# Patient Record
Sex: Male | Born: 1965 | Race: Black or African American | Hispanic: No | Marital: Single | Smoking: Former smoker
Health system: Southern US, Community
[De-identification: ages and names within clinical notes are randomized; demographics above are authoritative.]

## PROBLEM LIST (undated history)

## (undated) DIAGNOSIS — R519 Headache, unspecified: Secondary | ICD-10-CM

## (undated) DIAGNOSIS — S069XAA Unspecified intracranial injury with loss of consciousness status unknown, initial encounter: Secondary | ICD-10-CM

## (undated) DIAGNOSIS — G8929 Other chronic pain: Secondary | ICD-10-CM

## (undated) DIAGNOSIS — F1020 Alcohol dependence, uncomplicated: Secondary | ICD-10-CM

## (undated) DIAGNOSIS — Z5189 Encounter for other specified aftercare: Secondary | ICD-10-CM

## (undated) DIAGNOSIS — L309 Dermatitis, unspecified: Secondary | ICD-10-CM

## (undated) DIAGNOSIS — S069X9A Unspecified intracranial injury with loss of consciousness of unspecified duration, initial encounter: Secondary | ICD-10-CM

## (undated) DIAGNOSIS — F32A Depression, unspecified: Secondary | ICD-10-CM

## (undated) DIAGNOSIS — R51 Headache: Secondary | ICD-10-CM

## (undated) DIAGNOSIS — F329 Major depressive disorder, single episode, unspecified: Secondary | ICD-10-CM

## (undated) HISTORY — DX: Major depressive disorder, single episode, unspecified: F32.9

## (undated) HISTORY — DX: Unspecified intracranial injury with loss of consciousness of unspecified duration, initial encounter: S06.9X9A

## (undated) HISTORY — DX: Headache: R51

## (undated) HISTORY — PX: EYE SURGERY: SHX253

## (undated) HISTORY — DX: Encounter for other specified aftercare: Z51.89

## (undated) HISTORY — DX: Other chronic pain: G89.29

## (undated) HISTORY — DX: Headache, unspecified: R51.9

## (undated) HISTORY — DX: Dermatitis, unspecified: L30.9

## (undated) HISTORY — DX: Unspecified intracranial injury with loss of consciousness status unknown, initial encounter: S06.9XAA

## (undated) HISTORY — DX: Alcohol dependence, uncomplicated: F10.20

## (undated) HISTORY — PX: JEJUNOSTOMY FEEDING TUBE: SUR737

## (undated) HISTORY — PX: IVC FILTER INSERTION: CATH118245

## (undated) HISTORY — DX: Depression, unspecified: F32.A

---

## 2004-07-10 ENCOUNTER — Emergency Department (HOSPITAL_COMMUNITY): Admission: EM | Admit: 2004-07-10 | Discharge: 2004-07-10 | Payer: Self-pay | Admitting: Emergency Medicine

## 2011-09-09 HISTORY — PX: BRAIN SURGERY: SHX531

## 2011-09-10 ENCOUNTER — Emergency Department (HOSPITAL_COMMUNITY): Payer: BC Managed Care – PPO

## 2011-09-10 ENCOUNTER — Inpatient Hospital Stay (HOSPITAL_COMMUNITY)
Admission: EM | Admit: 2011-09-10 | Discharge: 2011-10-08 | DRG: 877 | Disposition: A | Discharge status: Rehabilitation Facility | Payer: BC Managed Care – PPO | Source: Ambulatory Visit | Attending: General Surgery | Admitting: General Surgery

## 2011-09-10 ENCOUNTER — Encounter (HOSPITAL_COMMUNITY): Payer: Self-pay | Admitting: Neurological Surgery

## 2011-09-10 DIAGNOSIS — E781 Pure hyperglyceridemia: Secondary | ICD-10-CM | POA: Diagnosis present

## 2011-09-10 DIAGNOSIS — E46 Unspecified protein-calorie malnutrition: Secondary | ICD-10-CM | POA: Diagnosis present

## 2011-09-10 DIAGNOSIS — S0193XA Puncture wound without foreign body of unspecified part of head, initial encounter: Secondary | ICD-10-CM

## 2011-09-10 DIAGNOSIS — S062XAA Diffuse traumatic brain injury with loss of consciousness status unknown, initial encounter: Principal | ICD-10-CM | POA: Diagnosis present

## 2011-09-10 DIAGNOSIS — D62 Acute posthemorrhagic anemia: Secondary | ICD-10-CM | POA: Diagnosis not present

## 2011-09-10 DIAGNOSIS — S069X9A Unspecified intracranial injury with loss of consciousness of unspecified duration, initial encounter: Secondary | ICD-10-CM | POA: Diagnosis present

## 2011-09-10 DIAGNOSIS — J95821 Acute postprocedural respiratory failure: Secondary | ICD-10-CM | POA: Diagnosis present

## 2011-09-10 DIAGNOSIS — X749XXA Intentional self-harm by unspecified firearm discharge, initial encounter: Secondary | ICD-10-CM | POA: Diagnosis present

## 2011-09-10 DIAGNOSIS — F17200 Nicotine dependence, unspecified, uncomplicated: Secondary | ICD-10-CM | POA: Diagnosis present

## 2011-09-10 DIAGNOSIS — S0190XA Unspecified open wound of unspecified part of head, initial encounter: Principal | ICD-10-CM | POA: Diagnosis present

## 2011-09-10 DIAGNOSIS — Z23 Encounter for immunization: Secondary | ICD-10-CM

## 2011-09-10 DIAGNOSIS — J15211 Pneumonia due to Methicillin susceptible Staphylococcus aureus: Secondary | ICD-10-CM | POA: Diagnosis not present

## 2011-09-10 DIAGNOSIS — J9819 Other pulmonary collapse: Secondary | ICD-10-CM | POA: Diagnosis present

## 2011-09-10 DIAGNOSIS — J9600 Acute respiratory failure, unspecified whether with hypoxia or hypercapnia: Secondary | ICD-10-CM | POA: Diagnosis present

## 2011-09-10 DIAGNOSIS — S069XAA Unspecified intracranial injury with loss of consciousness status unknown, initial encounter: Secondary | ICD-10-CM | POA: Diagnosis present

## 2011-09-10 DIAGNOSIS — Z6830 Body mass index (BMI) 30.0-30.9, adult: Secondary | ICD-10-CM

## 2011-09-10 LAB — TYPE AND SCREEN
Antibody Screen: NEGATIVE
Unit division: 0

## 2011-09-10 LAB — POCT I-STAT 3, ART BLOOD GAS (G3+)
Acid-base deficit: 6 mmol/L — ABNORMAL HIGH (ref 0.0–2.0)
O2 Saturation: 100 %

## 2011-09-10 LAB — COMPREHENSIVE METABOLIC PANEL
Albumin: 3.2 g/dL — ABNORMAL LOW (ref 3.5–5.2)
Alkaline Phosphatase: 63 U/L (ref 39–117)
BUN: 16 mg/dL (ref 6–23)
Calcium: 8.1 mg/dL — ABNORMAL LOW (ref 8.4–10.5)
Creatinine, Ser: 0.87 mg/dL (ref 0.50–1.35)
Potassium: 3.8 mEq/L (ref 3.5–5.1)
Total Protein: 6.1 g/dL (ref 6.0–8.3)

## 2011-09-10 LAB — BLOOD GAS, ARTERIAL
Acid-base deficit: 4.6 mmol/L — ABNORMAL HIGH (ref 0.0–2.0)
Bicarbonate: 20.1 mEq/L (ref 20.0–24.0)
FIO2: 0.5 %
TCO2: 21.2 mmol/L (ref 0–100)
pCO2 arterial: 37.5 mmHg (ref 35.0–45.0)
pH, Arterial: 7.348 — ABNORMAL LOW (ref 7.350–7.450)
pO2, Arterial: 139 mmHg — ABNORMAL HIGH (ref 80.0–100.0)

## 2011-09-10 LAB — URINALYSIS, ROUTINE W REFLEX MICROSCOPIC
Glucose, UA: 100 mg/dL — AB
Protein, ur: 100 mg/dL — AB

## 2011-09-10 LAB — CBC
HCT: 35.4 % — ABNORMAL LOW (ref 39.0–52.0)
MCH: 28.8 pg (ref 26.0–34.0)
MCHC: 33.3 g/dL (ref 30.0–36.0)
MCV: 86.3 fL (ref 78.0–100.0)
Platelets: 202 10*3/uL (ref 150–400)
RDW: 13.7 % (ref 11.5–15.5)

## 2011-09-10 LAB — RAPID URINE DRUG SCREEN, HOSP PERFORMED
Amphetamines: NOT DETECTED
Benzodiazepines: POSITIVE — AB
Cocaine: NOT DETECTED
Opiates: NOT DETECTED

## 2011-09-10 LAB — DIFFERENTIAL
Eosinophils Absolute: 0 10*3/uL (ref 0.0–0.7)
Eosinophils Relative: 0 % (ref 0–5)
Lymphs Abs: 1.3 10*3/uL (ref 0.7–4.0)
Monocytes Relative: 4 % (ref 3–12)

## 2011-09-10 LAB — POCT I-STAT, CHEM 8
BUN: 18 mg/dL (ref 6–23)
Calcium, Ion: 1.2 mmol/L (ref 1.12–1.32)
Creatinine, Ser: 0.9 mg/dL (ref 0.50–1.35)
TCO2: 21 mmol/L (ref 0–100)

## 2011-09-10 LAB — URINE MICROSCOPIC-ADD ON

## 2011-09-10 LAB — ETHANOL: Alcohol, Ethyl (B): <11 mg/dL (ref 0–11)

## 2011-09-10 MED ORDER — MIDAZOLAM BOLUS VIA INFUSION
1.0000 mg | INTRAVENOUS | Status: DC | PRN
  Filled 2011-09-10: qty 2

## 2011-09-10 MED ORDER — SODIUM CHLORIDE 0.9 % IV SOLN
2.0000 mg/h | INTRAVENOUS | Status: DC
  Administered 2011-09-10 – 2011-09-11 (×3): 2 mg/h via INTRAVENOUS
  Administered 2011-09-11: 4 mg/h via INTRAVENOUS
  Administered 2011-09-13 – 2011-09-14 (×2): 2 mg/h via INTRAVENOUS
  Administered 2011-09-14: 3 mg/h via INTRAVENOUS
  Administered 2011-09-15: 1 mg/h via INTRAVENOUS
  Filled 2011-09-10 (×8): qty 10

## 2011-09-10 MED ORDER — SUCCINYLCHOLINE CHLORIDE 20 MG/ML IJ SOLN
INTRAMUSCULAR | Status: DC | PRN
  Administered 2011-09-10: 75 mg via INTRAVENOUS
  Administered 2011-09-10: 125 mg via INTRAVENOUS

## 2011-09-10 MED ORDER — ROCURONIUM BROMIDE 50 MG/5ML IV SOLN
0.6000 mg/kg | Freq: Once | INTRAVENOUS | Status: AC
  Administered 2011-09-10: 100 mg via INTRAVENOUS

## 2011-09-10 MED ORDER — LIDOCAINE HCL (CARDIAC) 20 MG/ML IV SOLN
INTRAVENOUS | Status: DC | PRN
  Administered 2011-09-10: 100 mg via INTRAVENOUS

## 2011-09-10 MED ORDER — SODIUM CHLORIDE 0.9 % IJ SOLN
10.0000 mL | INTRAMUSCULAR | Status: DC | PRN
  Administered 2011-09-19 (×2): 10 mL

## 2011-09-10 MED ORDER — MANNITOL 25 % IV SOLN
25.0000 g/h | Freq: Four times a day (QID) | INTRAVENOUS | Status: DC
  Filled 2011-09-10 (×4): qty 100

## 2011-09-10 MED ORDER — SODIUM CHLORIDE 0.9 % IJ SOLN
10.0000 mL | Freq: Two times a day (BID) | INTRAMUSCULAR | Status: DC
  Administered 2011-09-10 – 2011-09-13 (×7): 10 mL
  Administered 2011-09-14: 20 mL
  Administered 2011-09-14 – 2011-09-15 (×3): 10 mL
  Administered 2011-09-16: 40 mL
  Administered 2011-09-17: 20 mL
  Administered 2011-09-17: 40 mL
  Administered 2011-09-18: 10 mL
  Administered 2011-09-18: 40 mL
  Administered 2011-09-19 – 2011-09-20 (×4): 10 mL
  Administered 2011-09-21 (×2): 20 mL
  Administered 2011-09-22 – 2011-10-04 (×14): 10 mL
  Administered 2011-10-05 – 2011-10-07 (×4): 12 mL
  Administered 2011-10-08: 10 mL

## 2011-09-10 MED ORDER — ETOMIDATE 2 MG/ML IV SOLN
INTRAVENOUS | Status: DC | PRN
  Administered 2011-09-10: 20 mg via INTRAVENOUS

## 2011-09-10 MED ORDER — SODIUM CHLORIDE 0.9 % IV SOLN
50.0000 ug/h | INTRAVENOUS | Status: DC
  Administered 2011-09-10 (×2): 50 ug/h via INTRAVENOUS
  Administered 2011-09-12 – 2011-09-13 (×2): 75 ug/h via INTRAVENOUS
  Administered 2011-09-15: 70 ug/h via INTRAVENOUS
  Administered 2011-09-16: 100 ug/h via INTRAVENOUS
  Administered 2011-09-17: 75 ug/h via INTRAVENOUS
  Administered 2011-09-18 – 2011-09-21 (×2): 50 ug/h via INTRAVENOUS
  Administered 2011-09-23: 100 ug/h via INTRAVENOUS
  Filled 2011-09-10 (×11): qty 50

## 2011-09-10 MED ORDER — CHLORHEXIDINE GLUCONATE 0.12 % MT SOLN
OROMUCOSAL | Status: AC
  Administered 2011-09-10: 15 mL via OROMUCOSAL
  Filled 2011-09-10: qty 30

## 2011-09-10 MED ORDER — MIDAZOLAM HCL 2 MG/2ML IJ SOLN
INTRAMUSCULAR | Status: AC
  Administered 2011-09-10: 4 mg
  Filled 2011-09-10: qty 4

## 2011-09-10 MED ORDER — FENTANYL BOLUS VIA INFUSION
50.0000 ug | Freq: Four times a day (QID) | INTRAVENOUS | Status: DC | PRN
  Administered 2011-09-19 (×2): 75 ug via INTRAVENOUS
  Filled 2011-09-10: qty 100

## 2011-09-10 MED ORDER — CHLORHEXIDINE GLUCONATE 0.12 % MT SOLN
15.0000 mL | Freq: Two times a day (BID) | OROMUCOSAL | Status: DC
  Administered 2011-09-10 – 2011-10-08 (×55): 15 mL via OROMUCOSAL
  Filled 2011-09-10 (×58): qty 15

## 2011-09-10 MED ORDER — POTASSIUM CHLORIDE IN NACL 20-0.9 MEQ/L-% IV SOLN
INTRAVENOUS | Status: DC
  Administered 2011-09-10 – 2011-09-11 (×3): via INTRAVENOUS
  Administered 2011-09-12: 100 mL/h via INTRAVENOUS
  Administered 2011-09-12 – 2011-09-16 (×4): via INTRAVENOUS
  Administered 2011-09-18: 10 mL/h via INTRAVENOUS
  Administered 2011-09-20: 20 mL/h via INTRAVENOUS
  Administered 2011-09-22 – 2011-09-27 (×3): via INTRAVENOUS
  Filled 2011-09-10 (×22): qty 1000

## 2011-09-10 MED ORDER — FENTANYL CITRATE 0.05 MG/ML IJ SOLN
INTRAMUSCULAR | Status: AC
  Administered 2011-09-10: 50 ug
  Filled 2011-09-10: qty 2

## 2011-09-10 MED ORDER — MANNITOL 25 % IV SOLN
12.5000 g | Freq: Four times a day (QID) | INTRAVENOUS | Status: DC
  Administered 2011-09-10 – 2011-09-16 (×23): 12.5 g via INTRAVENOUS
  Filled 2011-09-10 (×48): qty 50

## 2011-09-10 MED ORDER — PHENYLEPHRINE HCL 10 MG/ML IJ SOLN
30.0000 ug/min | INTRAMUSCULAR | Status: AC
  Filled 2011-09-10: qty 1

## 2011-09-10 MED ORDER — BIOTENE DRY MOUTH MT LIQD
15.0000 mL | Freq: Two times a day (BID) | OROMUCOSAL | Status: DC
  Administered 2011-09-10 – 2011-09-16 (×13): 15 mL via OROMUCOSAL

## 2011-09-10 MED ORDER — PANTOPRAZOLE SODIUM 40 MG IV SOLR
40.0000 mg | Freq: Every day | INTRAVENOUS | Status: DC
  Administered 2011-09-10 – 2011-09-11 (×2): 40 mg via INTRAVENOUS
  Filled 2011-09-10 (×3): qty 40

## 2011-09-10 MED ORDER — FENTANYL CITRATE 0.05 MG/ML IJ SOLN
INTRAMUSCULAR | Status: AC
  Administered 2011-09-10: 100 ug
  Filled 2011-09-10: qty 2
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
## 2011-09-10 NOTE — ED Notes (Addendum)
 fentanyl given IV (rt AC)

## 2011-09-10 NOTE — ED Notes (Signed)
Family updated as to patient's status.

## 2011-09-10 NOTE — ED Notes (Signed)
Dr Preston Fleeting and RT at bedside attempting to intubate ot

## 2011-09-10 NOTE — ED Notes (Addendum)
75mg  Succinylcholine given via IV to rt [facility]

## 2011-09-10 NOTE — ED Notes (Signed)
Pt log rolled off backboard
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
## 2011-09-10 NOTE — ED Notes (Signed)
Family at beside. Family given emotional support. 

## 2011-09-10 NOTE — ED Notes (Signed)
Aspen collar placed.
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
## 2011-09-10 NOTE — ED Notes (Signed)
Returned from CT scan.
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
## 2011-09-10 NOTE — ED Notes (Signed)
[employer]SI at bedside. 

## 2011-09-10 NOTE — ED Notes (Signed)
IV fluids 1 liter NS hung to rt AC infusing via gravity. 100mg  Lidicane given IV rt AC

## 2011-09-10 NOTE — ED Notes (Signed)
Pt transported to CT via stretcher by RT and RN

## 2011-09-10 NOTE — Progress Notes (Signed)
Family set password as "[email or web address]"  Frederico Hamman, [place] [phone]

## 2011-09-10 NOTE — ED Notes (Addendum)
  fentanyl given via IV Rt AC 4mg  versed given via IV rt AC

## 2011-09-10 NOTE — ED Notes (Signed)
Intubated with 7.5 inserted by Dr Preston Fleeting, with positive color change

## 2011-09-10 NOTE — ED Notes (Signed)
IV fluids NS hung to lt AC infusing via gravity
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
## 2011-09-10 NOTE — ED Notes (Addendum)
Amidate 20mg  IV given rt AC Succinylcholine 125mg  IV given  Rt AC
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
## 2011-09-10 NOTE — ED Notes (Signed)
portable xray at bedside

## 2011-09-10 NOTE — ED Notes (Signed)
RT and DR Preston Fleeting cont'ing to attempt to intubate pt

## 2011-09-10 NOTE — ED Notes (Addendum)
Oral airway inserted by MD. Pt being Bagged

## 2011-09-10 NOTE — ED Notes (Signed)
intubated tube removed by Dr Preston Fleeting, tube not in place
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
## 2011-09-11 ENCOUNTER — Inpatient Hospital Stay (HOSPITAL_COMMUNITY): Payer: BC Managed Care – PPO

## 2011-09-11 DIAGNOSIS — S069X9A Unspecified intracranial injury with loss of consciousness of unspecified duration, initial encounter: Secondary | ICD-10-CM

## 2011-09-11 DIAGNOSIS — D62 Acute posthemorrhagic anemia: Secondary | ICD-10-CM | POA: Diagnosis not present

## 2011-09-11 DIAGNOSIS — S0190XA Unspecified open wound of unspecified part of head, initial encounter: Secondary | ICD-10-CM

## 2011-09-11 DIAGNOSIS — J95821 Acute postprocedural respiratory failure: Secondary | ICD-10-CM

## 2011-09-11 LAB — CBC
HCT: 34.6 % — ABNORMAL LOW (ref 39.0–52.0)
MCV: 86.7 fL (ref 78.0–100.0)
Platelets: 177 10*3/uL (ref 150–400)
RBC: 3.99 MIL/uL — ABNORMAL LOW (ref 4.22–5.81)
RDW: 14 % (ref 11.5–15.5)
WBC: 12.5 10*3/uL — ABNORMAL HIGH (ref 4.0–10.5)

## 2011-09-11 LAB — BASIC METABOLIC PANEL
BUN: 10 mg/dL (ref 6–23)
CO2: 23 mEq/L (ref 19–32)
Chloride: 109 mEq/L (ref 96–112)
Creatinine, Ser: 0.68 mg/dL (ref 0.50–1.35)
GFR calc Af Amer: >90 mL/min (ref >90)
Potassium: 4.1 mEq/L (ref 3.5–5.1)

## 2011-09-11 LAB — BLOOD GAS, ARTERIAL
Acid-base deficit: 2.5 mmol/L — ABNORMAL HIGH (ref 0.0–2.0)
Drawn by: 29017
FIO2: 0.5 %
MECHVT: 550 mL
Patient temperature: 98.6
RATE: 18 resp/min
TCO2: 22.3 mmol/L (ref 0–100)
pCO2 arterial: 33.2 mmHg — ABNORMAL LOW (ref 35.0–45.0)

## 2011-09-11 LAB — URINE CULTURE
Colony Count: NO GROWTH
Culture  Setup Time: 201303141408
Culture: NO GROWTH

## 2011-09-11 MED ORDER — CEFTRIAXONE SODIUM 1 G IJ SOLR: 1.0000 g | INTRAMUSCULAR | Status: DC

## 2011-09-11 MED ORDER — VITAMIN E 15 UNIT/0.3ML PO SOLN
400.0000 [IU] | Freq: Three times a day (TID) | ORAL | Status: AC
  Administered 2011-09-11: 50 [IU]
  Administered 2011-09-11: 100 [IU]
  Administered 2011-09-11: 50 [IU]
  Administered 2011-09-12 (×2): 400 [IU]
  Administered 2011-09-12: 50 [IU]
  Administered 2011-09-13: 400 [IU]
  Administered 2011-09-13: 50 [IU]
  Administered 2011-09-13 – 2011-09-17 (×11): 400 [IU]
  Filled 2011-09-11 (×28): qty 8

## 2011-09-11 MED ORDER — PIVOT 1.5 CAL PO LIQD
1000.0000 mL | ORAL | Status: DC
  Administered 2011-09-11 – 2011-09-14 (×3): 1000 mL
  Filled 2011-09-11 (×6): qty 1000

## 2011-09-11 MED ORDER — DEXTROSE 5 % IV SOLN
1.0000 g | INTRAVENOUS | Status: DC
  Administered 2011-09-11 – 2011-09-16 (×6): 1 g via INTRAVENOUS
  Filled 2011-09-11 (×7): qty 10

## 2011-09-11 MED ORDER — PRO-STAT SUGAR FREE PO LIQD
30.0000 mL | Freq: Four times a day (QID) | ORAL | Status: DC
  Administered 2011-09-11 – 2011-09-15 (×17): 30 mL
  Filled 2011-09-11 (×20): qty 30

## 2011-09-11 MED ORDER — ACETAMINOPHEN 160 MG/5ML PO SOLN
650.0000 mg | ORAL | Status: DC | PRN
  Administered 2011-09-11 – 2011-10-06 (×31): 650 mg via ORAL
  Filled 2011-09-11 (×31): qty 20.3

## 2011-09-11 MED ORDER — PIVOT 1.5 CAL PO LIQD
1000.0000 mL | ORAL | Status: DC
  Filled 2011-09-11 (×2): qty 1000

## 2011-09-11 MED ORDER — SELENIUM 50 MCG PO TABS
200.0000 ug | ORAL_TABLET | Freq: Every day | ORAL | Status: AC
Start: 2011-09-11 — End: 2011-09-17
  Administered 2011-09-11 – 2011-09-17 (×7): 200 ug
  Filled 2011-09-11 (×8): qty 4

## 2011-09-11 MED ORDER — METOPROLOL TARTRATE 25 MG/10 ML ORAL SUSPENSION
25.0000 mg | Freq: Two times a day (BID) | ORAL | Status: DC
  Administered 2011-09-11 – 2011-09-22 (×24): 25 mg via ORAL
  Filled 2011-09-11 (×26): qty 10

## 2011-09-11 MED ORDER — VITAMIN C 500 MG PO TABS
1000.0000 mg | ORAL_TABLET | Freq: Three times a day (TID) | ORAL | Status: AC
  Administered 2011-09-11 – 2011-09-17 (×21): 1000 mg
  Filled 2011-09-11 (×22): qty 2
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
## 2011-09-11 NOTE — Progress Notes (Signed)
F/C with RUE Opens eyes to voice Patient examined and I agree with the assessment and plan  Violeta Gelinas, MD, MPH, FACS Pager: [phone]  09/11/2011 9:50 AM

## 2011-09-11 NOTE — Progress Notes (Signed)
Utilization review completed. Dalary Hollar Diane3/15/2013
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
## 2011-09-12 ENCOUNTER — Inpatient Hospital Stay (HOSPITAL_COMMUNITY): Payer: BC Managed Care – PPO

## 2011-09-12 LAB — BASIC METABOLIC PANEL
Calcium: 8.7 mg/dL (ref 8.4–10.5)
GFR calc non Af Amer: >90 mL/min (ref >90)
Sodium: 140 mEq/L (ref 135–145)

## 2011-09-12 LAB — CBC
HCT: 31.1 % — ABNORMAL LOW (ref 39.0–52.0)
MCH: 27.9 pg (ref 26.0–34.0)
MCHC: 32.2 g/dL (ref 30.0–36.0)
MCV: 86.9 fL (ref 78.0–100.0)
Platelets: 165 10*3/uL (ref 150–400)
RDW: 13.9 % (ref 11.5–15.5)

## 2011-09-12 MED ORDER — PANTOPRAZOLE SODIUM 40 MG PO PACK
40.0000 mg | PACK | Freq: Every day | ORAL | Status: DC
  Administered 2011-09-12 – 2011-10-08 (×26): 40 mg
  Filled 2011-09-12 (×28): qty 20

## 2011-09-12 NOTE — Progress Notes (Signed)
I have seen and examined the patient and agree with the assessment and plans.  Ameshia Pewitt A. Toney Difatta  MD, FACS  
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
## 2011-09-13 ENCOUNTER — Inpatient Hospital Stay (HOSPITAL_COMMUNITY): Payer: BC Managed Care – PPO

## 2011-09-13 LAB — CBC
Platelets: 148 10*3/uL — ABNORMAL LOW (ref 150–400)
RBC: 3.28 MIL/uL — ABNORMAL LOW (ref 4.22–5.81)
WBC: 12.5 10*3/uL — ABNORMAL HIGH (ref 4.0–10.5)

## 2011-09-13 LAB — CLOSTRIDIUM DIFFICILE BY PCR: Toxigenic C. Difficile by PCR: NEGATIVE

## 2011-09-13 MED ORDER — ARTIFICIAL TEARS OP OINT
TOPICAL_OINTMENT | Freq: Three times a day (TID) | OPHTHALMIC | Status: DC
  Administered 2011-09-13 – 2011-09-15 (×8): via OPHTHALMIC
  Administered 2011-09-16: 1 via OPHTHALMIC
  Administered 2011-09-16 – 2011-09-19 (×9): via OPHTHALMIC
  Administered 2011-09-19: 1 via OPHTHALMIC
  Administered 2011-09-19 – 2011-09-21 (×5): via OPHTHALMIC
  Administered 2011-09-21: 1 via OPHTHALMIC
  Administered 2011-09-21 – 2011-09-25 (×13): via OPHTHALMIC
  Administered 2011-09-26: 1 via OPHTHALMIC
  Administered 2011-09-26: 14:00:00 via OPHTHALMIC
  Administered 2011-09-26: 1 via OPHTHALMIC
  Administered 2011-09-27 – 2011-10-08 (×35): via OPHTHALMIC
  Filled 2011-09-13 (×4): qty 3.5

## 2011-09-13 MED ORDER — ALTEPLASE 100 MG IV SOLR
2.0000 mg | Freq: Once | INTRAVENOUS | Status: AC
  Administered 2011-09-13: 2 mg
  Filled 2011-09-13: qty 2

## 2011-09-13 NOTE — Progress Notes (Signed)
Subjective:  Patient continues on ventilator via endotracheal tube. Patient was seen after sedation with Versed and fentanyl and held for about 1 hour 45 minutes.  Objective: Vital signs in last 24 hours: Filed Vitals:   09/13/11 0800 09/13/11 0807 09/13/11 0844 09/13/11 0900  BP: 153/79  153/79 144/74  Pulse: 84  67 74  Temp: 101.7 F (38.7 C)  101.5 F (38.6 C) 101.1 F (38.4 C)  TempSrc:   Core (Comment)   Resp: 23  19 21   Height:      Weight:      SpO2: 100% 100% 100% 100%    Intake/Output from previous day: 03/16 0701 - 03/17 0700 In: 3871 [I.V.:2536; ZO/XW:9604; IV Piggyback:50] Out: 2435 [Urine:2435] Intake/Output this shift: Total I/O In: 170 [I.V.:100; NG/GT:70] Out: 235 [Urine:235]  Physical Exam:  Dressing had been removed by trauma surgery staff. There was a small amount of devitalized brain tissue that could be expressed from the subgaleal space through the entrance wound, but no evidence of purulence or bleeding at this time. Patient opens eyes To voice. Pupils 2.5 mm bilaterally round and sluggishly reactive to light. Flaccid on left, squeezes and releases on right to command.  CBC  Basename 09/13/11 0500 09/12/11 0400  WBC 12.5* 9.9  HGB 9.3* 10.0*  HCT 28.5* 31.1*  PLT 148* 165   BMET  Basename 09/12/11 0400 09/11/11 0525  NA 140 140  K 4.4 4.1  CL 108 109  CO2 26 23  GLUCOSE 142* 135*  BUN 14 10  CREATININE 0.75 0.68  CALCIUM 8.7 8.7     Assessment/Plan: Stable from neurosurgical perspective. For followup CT of brain without contrast in a.m.   Hewitt Shorts, MD 09/13/2011, 9:40 AM

## 2011-09-13 NOTE — Progress Notes (Signed)
More sedated now and did not F/C for me.  Weaned well but now no weaning per NS. Patient examined and I agree with the assessment and plan  Violeta Gelinas, MD, MPH, FACS Pager: [phone]  09/13/2011 12:40 PM
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
## 2011-09-14 ENCOUNTER — Inpatient Hospital Stay (HOSPITAL_COMMUNITY): Payer: BC Managed Care – PPO

## 2011-09-14 LAB — BASIC METABOLIC PANEL
BUN: 15 mg/dL (ref 6–23)
Chloride: 108 mEq/L (ref 96–112)
Glucose, Bld: 143 mg/dL — ABNORMAL HIGH (ref 70–99)
Potassium: 4 mEq/L (ref 3.5–5.1)
Sodium: 142 mEq/L (ref 135–145)

## 2011-09-14 LAB — URINE CULTURE
Colony Count: NO GROWTH
Culture: NO GROWTH

## 2011-09-14 LAB — CBC
MCH: 28.4 pg (ref 26.0–34.0)
Platelets: 180 10*3/uL (ref 150–400)
RBC: 3.28 MIL/uL — ABNORMAL LOW (ref 4.22–5.81)
RDW: 13.7 % (ref 11.5–15.5)

## 2011-09-14 MED ORDER — WHITE PETROLATUM GEL
Status: AC
  Filled 2011-09-14: qty 5

## 2011-09-14 NOTE — Progress Notes (Signed)
The patient is not very responsive this morning.  Will not open eyes spontaneously.  Pupils are 2-3 and sluggishly reactive.  Still on some sedation.  This patient has been seen and I agree with the findings and treatment plan.  Marta Lamas. Gae Bon, MD, FACS [phone] (pager) [phone] (direct pager) Trauma Surgeon
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
## 2011-09-14 NOTE — Progress Notes (Signed)
Chaplain Note:  Chaplain responded immediately to referral from nurse to provide care for family of pt.  Chaplain introduced Mark [name] to pt's family and was invited into room.  Pt was asleep and did not communicate or show any sign of awareness that visitors were present.  Pt's family was at bedside.   Chaplain provided spiritual comfort, support, and prayer for pt and family.  Family was dealing with sadness and disappointment that, prior to attempting suicide, pt had not been comfortable sharing feelings and problems with anyone.  They lean on strong faith for spiritual strength.  Family expressed appreciation for chaplain support.  Chaplain will continue to follow this case.  09/14/11 1400  Clinical Encounter Type  Visited With Patient and family together  Visit Type Initial;Spiritual support  Referral From Nurse  Spiritual Encounters  Spiritual Needs Emotional;Prayer  Stress Factors  Patient Stress Factors Health changes  Family Stress Factors Lack of knowledge   Verdie Shire, chaplain resident [phone]

## 2011-09-14 NOTE — Progress Notes (Signed)
Patient remains sedated on the vent at this time.  Clinical Social Worker spoke with patient mother during quiet hours in 3300 waiting area.  Patient mother and grandmother and grieving appropriately concerning patient situation.  Patient mother appreciative for follow up support from CSW and states that they have also had a visit from pastoral care today.  CSW provided patient mother with social work contact information and explained social work role during patient hospital course.  Clinical Social Worker will remain available for support as needed for patient and patient family.  [address] [place], [place] [phone]

## 2011-09-15 ENCOUNTER — Inpatient Hospital Stay (HOSPITAL_COMMUNITY): Payer: BC Managed Care – PPO

## 2011-09-15 LAB — CBC
HCT: 30.6 % — ABNORMAL LOW (ref 39.0–52.0)
Hemoglobin: 9.8 g/dL — ABNORMAL LOW (ref 13.0–17.0)
MCH: 27.9 pg (ref 26.0–34.0)
MCHC: 32 g/dL (ref 30.0–36.0)
MCV: 87.2 fL (ref 78.0–100.0)

## 2011-09-15 LAB — BASIC METABOLIC PANEL
BUN: 17 mg/dL (ref 6–23)
Calcium: 9.5 mg/dL (ref 8.4–10.5)
Creatinine, Ser: 0.52 mg/dL (ref 0.50–1.35)
GFR calc non Af Amer: >90 mL/min (ref >90)
Glucose, Bld: 128 mg/dL — ABNORMAL HIGH (ref 70–99)

## 2011-09-15 LAB — OSMOLALITY: Osmolality: 297 mOsm/kg (ref 275–300)

## 2011-09-15 MED ORDER — VANCOMYCIN HCL 1000 MG IV SOLR
1250.0000 mg | Freq: Three times a day (TID) | INTRAVENOUS | Status: DC
  Administered 2011-09-15 – 2011-09-16 (×5): 1250 mg via INTRAVENOUS
  Filled 2011-09-15 (×6): qty 1250

## 2011-09-15 MED ORDER — PIVOT 1.5 CAL PO LIQD
1000.0000 mL | ORAL | Status: DC
  Administered 2011-09-16 – 2011-09-21 (×5): 1000 mL
  Filled 2011-09-15 (×10): qty 1000

## 2011-09-15 MED ORDER — PRO-STAT SUGAR FREE PO LIQD
60.0000 mL | Freq: Four times a day (QID) | ORAL | Status: DC
  Administered 2011-09-15 – 2011-09-22 (×30): 60 mL
  Filled 2011-09-15 (×44): qty 60
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
## 2011-09-15 NOTE — Progress Notes (Signed)
No movement RUE or eye opening on my exam Plan meeting with family Patient examined and I agree with the assessment and plan  Violeta Gelinas, MD, MPH, FACS Pager: [phone]  09/15/2011 12:35 PM

## 2011-09-15 NOTE — Progress Notes (Signed)
Nutrition Follow-up  Diet Order:  NPO  Meds: Scheduled Meds:   . antiseptic oral rinse  15 mL Mouth Rinse q12n4p  . artificial tears   Both Eyes Q8H  . cefTRIAXone (ROCEPHIN)  IV  1 g Intravenous Q24H  . chlorhexidine  15 mL Mouth Rinse BID  . feeding supplement  30 mL Per Tube QID  . mannitol  12.5 g Intravenous Q6H  . metoprolol tartrate  25 mg Oral BID  . pantoprazole sodium  40 mg Per Tube Q1200  . selenium  200 mcg Per Tube Daily  . sodium chloride  10-40 mL Intracatheter Q12H  . vancomycin  1,250 mg Intravenous Q8H  . vitamin C  1,000 mg Per Tube TID  . vitamin e  400 Units Per Tube TID  . white petrolatum       Continuous Infusions:   . 0.9 % NaCl with KCl 20 mEq / L 20 mL/hr at 09/14/11 2000  . feeding supplement (PIVOT 1.5 CAL) 1,000 mL (09/14/11 0242)  . fentaNYL infusion INTRAVENOUS 80 mcg/hr (09/15/11 1024)  . midazolam (VERSED) infusion 2 mg/hr (09/15/11 0830)   PRN Meds:.acetaminophen (TYLENOL) oral liquid 160 mg/5 mL, fentaNYL, midazolam, sodium chloride  Labs:  CMP     Component Value Date/Time   NA 142 09/15/2011 0500   K 3.9 09/15/2011 0500   CL 103 09/15/2011 0500   CO2 28 09/15/2011 0500   GLUCOSE 128* 09/15/2011 0500   BUN 17 09/15/2011 0500   CREATININE 0.52 09/15/2011 0500   CALCIUM 9.5 09/15/2011 0500   PROT 6.1 09/10/2011 1313   ALBUMIN 3.2* 09/10/2011 1313   AST 28 09/10/2011 1313   ALT 18 09/10/2011 1313   ALKPHOS 63 09/10/2011 1313   BILITOT 0.3 09/10/2011 1313   GFRNONAA >90 09/15/2011 0500   GFRAA >90 09/15/2011 0500  CBG (last 3)  No results found for this basename: GLUCAP:3 in the last 72 hours   Intake/Output Summary (Last 24 hours) at 09/15/11 1136 Last data filed at 09/15/11 1000  Gross per 24 hour  Intake 2442.32 ml  Output   2725 ml  Net -282.68 ml   Stool output: 250 ml 3/19; cdiff negative on 3/17  Temp: 38, pt remains on cooling blanket MV: 12  Current TF: Pivot 1.5 @ 35 ml/hr with 30 ml Prostat QID via OGT, provides 1548 kcals,  138.7 grams protein, 638 ml H2O  Per MD note, no plans for weaning at this time, pt flaccid on his left side, pt positive 6 L since admission . Pt on cooling blanket. Pt on versed for sedation. No family currently present.   Weight Status: 273 lbs 3/19    281 lbs 3/18    268 lbs 3/17  Re-estimated needs:  2643 kcals; 189 grams protein  Nutrition Dx: Inadequate oral intake (NI-2.1). Status: Ongoing  Goal:  Enteral nutrition to provide 60-70% of estimated calorie needs (22-25 kcals/kg ideal body weight) and 100% of estimated protein needs, based on ASPEN guidelines for permissive underfeeding in critically ill obese individuals; not met  Intervention:    Continue Pivot 1.5 @ 35 ml/hr  Increase Prostat to 60 ml QID  Above will provide 1836 kcals (69% of kcals needs), 198 grams protein (>100% of his protein needs), and 638 ml H2O  Monitor:  TF adequacy/tolerance, extubation, weight changes, labs   Kendell Bane Cornelison Pager #:  [phone]

## 2011-09-15 NOTE — Progress Notes (Signed)
Spoke with mother at pt's bedside re: arranging a family meeting. Will plan for Thursday 09/17/2011 at 1230pm unless mother notifies [place] otherwise. She was planning to contact the family members that she would like to attend the meeting.

## 2011-09-16 ENCOUNTER — Inpatient Hospital Stay (HOSPITAL_COMMUNITY): Payer: BC Managed Care – PPO

## 2011-09-16 LAB — BASIC METABOLIC PANEL
Calcium: 9.6 mg/dL (ref 8.4–10.5)
GFR calc non Af Amer: >90 mL/min (ref >90)
Potassium: 3.9 mEq/L (ref 3.5–5.1)
Sodium: 145 mEq/L (ref 135–145)

## 2011-09-16 LAB — CULTURE, RESPIRATORY W GRAM STAIN

## 2011-09-16 LAB — CBC
Hemoglobin: 9.6 g/dL — ABNORMAL LOW (ref 13.0–17.0)
MCH: 28.5 pg (ref 26.0–34.0)
MCHC: 31.8 g/dL (ref 30.0–36.0)
Platelets: 245 10*3/uL (ref 150–400)
RBC: 3.37 MIL/uL — ABNORMAL LOW (ref 4.22–5.81)

## 2011-09-16 LAB — VANCOMYCIN, TROUGH: Vancomycin Tr: <5 ug/mL — ABNORMAL LOW (ref 10.0–20.0)

## 2011-09-16 MED ORDER — VANCOMYCIN HCL 1000 MG IV SOLR
1500.0000 mg | Freq: Three times a day (TID) | INTRAVENOUS | Status: DC
  Administered 2011-09-17: 1500 mg via INTRAVENOUS
  Filled 2011-09-16 (×3): qty 1500

## 2011-09-16 MED ORDER — PROPOFOL 10 MG/ML IV EMUL
5.0000 ug/kg/min | INTRAVENOUS | Status: DC
  Administered 2011-09-16: 20 ug/kg/min via INTRAVENOUS
  Administered 2011-09-16: 5 ug/kg/min via INTRAVENOUS
  Administered 2011-09-17 (×2): 20 ug/kg/min via INTRAVENOUS
  Administered 2011-09-17: 15 ug/kg/min via INTRAVENOUS
  Administered 2011-09-17 – 2011-09-18 (×2): 20 ug/kg/min via INTRAVENOUS
  Administered 2011-09-18: 15 ug/kg/min via INTRAVENOUS
  Filled 2011-09-16 (×8): qty 100

## 2011-09-16 MED ORDER — ALTEPLASE 2 MG IJ SOLR
2.0000 mg | Freq: Once | INTRAMUSCULAR | Status: AC
  Administered 2011-09-16: 2 mg
  Filled 2011-09-16: qty 2
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
## 2011-09-16 NOTE — Progress Notes (Signed)
Patient ID: Mark [name], male   DOB: Nov 11, 1965, 46 y.o.   MRN: [id number] Subjective: Patient remains intubated, lots of secretions and work of breathing.  Objective: Vital signs in last 24 hours: Temp:  [99.8 F (37.7 C)-101.2 F (38.4 C)] 100 F (37.8 C) (03/20 0400) Pulse Rate:  [62-108] 80  (03/20 0718) Resp:  [13-30] 22  (03/20 0718) BP: (124-168)/(65-92) 168/89 mmHg (03/20 0718) SpO2:  [96 %-100 %] 100 % (03/20 0718) FiO2 (%):  [30 %] 30 % (03/20 0718) Weight:  [125.2 kg (276 lb 0.3 oz)] 125.2 kg (276 lb 0.3 oz) (03/20 0500)  Intake/Output from previous day: 03/19 0701 - 03/20 0700 In: 2964.7 [I.V.:764.7; NG/GT:1200; IV Piggyback:900] Out: 2045 [Urine:2045] Intake/Output this shift:    Neurologic: Motor: follows commands with RUE, moves LE to pain (reflex?), no mvmt on L Opens eyes to stimulation. Entrance wound clean.  Lab Results: Lab Results  Component Value Date   WBC 12.4* 09/16/2011   HGB 9.6* 09/16/2011   HCT 30.2* 09/16/2011   MCV 89.6 09/16/2011   PLT 245 09/16/2011   No results found for this basename: INR, PROTIME   BMET Lab Results  Component Value Date   NA 145 09/16/2011   K 3.9 09/16/2011   CL 107 09/16/2011   CO2 30 09/16/2011   GLUCOSE 130* 09/16/2011   BUN 21 09/16/2011   CREATININE 0.64 09/16/2011   CALCIUM 9.6 09/16/2011    Studies/Results: Dg Chest Port 1 View  09/16/2011  *RADIOLOGY REPORT*  Clinical Data: On ventilator, follow-up airspace disease  PORTABLE CHEST - 1 VIEW  Comparison: Portable chest x-ray of 09/15/2011  Findings: The lungs remain poorly aerated with basilar atelectasis. Cardiomegaly is stable. There may be mild pulmonary vascular congestion present.  Endotracheal tube is present with the tip approximately 3.1 cm above the carina.  IMPRESSION: Little change in poor aeration, cardiomegaly, and possible mild pulmonary vascular congestion.  Original Report Authenticated By: Juline Patch, M.D.   Dg Chest Port 1 View  09/15/2011   *RADIOLOGY REPORT*  Clinical Data: Air space disease.  PORTABLE CHEST - 1 VIEW  Comparison: 09/14/2011.  Findings: Endotracheal tube tip 3 cm above the carina.  Right PICC line tip projects at the expected level of the mid superior vena cava.  Evaluation slightly limited by patient's habitus.  Nasogastric tube courses below the diaphragm.  The tip is not included on this exam.  Cardiomegaly.  Pulmonary vascular congestion/pulmonary edema.  Difficult to exclude basilar infiltrate as versus atelectatic changes.  IMPRESSION: No significant change.  Original Report Authenticated By: Fuller Canada, M.D.    Assessment/Plan: Likely needs trach. Neuro stable, but prognosis for functional motor recovery is poor.    LOS: 6 days    Jamelyn Bovard S 09/16/2011, 8:45 AM

## 2011-09-16 NOTE — Progress Notes (Signed)
ANTIBIOTIC CONSULT NOTE - FOLLOW UP  Pharmacy Consult for Vancomycin Indication: rule out pneumonia  No Known Allergies  Patient Measurements: Height: 6\' 5"  (195.6 cm) Weight: 276 lb 0.3 oz (125.2 kg) IBW/kg (Calculated) : 89.1   Vital Signs: Temp: 100.6 F (38.1 C) (03/20 2000) Temp src: Rectal (03/20 2000) BP: 134/69 mmHg (03/20 2200) Pulse Rate: 63  (03/20 2200) Intake/Output from previous day: 03/19 0701 - 03/20 0700 In: 3029.7 [I.V.:794.7; NG/GT:1235; IV Piggyback:900] Out: 2045 [Urine:2045] Intake/Output from this shift: Total I/O In: 602.5 [I.V.:127.5; NG/GT:225; IV Piggyback:250] Out: 350 [Urine:350]  Labs:  [facility] 09/16/11 0620 09/15/11 0500 09/14/11 0335  WBC 12.4* 12.0* 11.6*  HGB 9.6* 9.8* 9.3*  PLT 245 230 180  LABCREA -- -- --  CREATININE 0.64 0.52 0.51   Estimated Creatinine Clearance: 172.5 ml/min (by C-G formula based on Cr of 0.64).  Basename 09/16/11 2125  VANCOTROUGH <5.0*  VANCOPEAK --  Drue Dun --  GENTTROUGH --  GENTPEAK --  GENTRANDOM --  TOBRATROUGH --  TOBRAPEAK --  TOBRARND --  AMIKACINPEAK --  AMIKACINTROU --  AMIKACIN --     Microbiology: Recent Results (from the past 720 hour(s))  URINE CULTURE     Status: Normal   Collection Time   09/10/11 12:53 PM      Component Value Range Status Comment   Specimen Description URINE, RANDOM   Final    Special Requests NONE   Final    Culture  Setup Time [id number]   Final    Colony Count NO GROWTH   Final    Culture NO GROWTH   Final    Report Status 09/11/2011 FINAL   Final   URINE CULTURE     Status: Normal   Collection Time   09/12/11 11:00 PM      Component Value Range Status Comment   Specimen Description URINE, CATHETERIZED   Final    Special Requests SENT FOUR RED TOPS FULL OF URINE   Final    Culture  Setup Time [id number]   Final    Colony Count NO GROWTH   Final    Culture NO GROWTH   Final    Report Status 09/14/2011 FINAL   Final   CULTURE, BLOOD (ROUTINE X  2)     Status: Normal (Preliminary result)   Collection Time   09/12/11 11:22 PM      Component Value Range Status Comment   Specimen Description BLOOD LEFT ARM   Final    Special Requests BOTTLES DRAWN AEROBIC AND ANAEROBIC 5CC EA   Final    Culture  Setup Time [id number]   Final    Culture     Final    Value:        BLOOD CULTURE RECEIVED NO GROWTH TO DATE CULTURE WILL BE HELD FOR 5 DAYS BEFORE ISSUING A FINAL NEGATIVE REPORT   Report Status PENDING   Incomplete   CULTURE, BLOOD (ROUTINE X 2)     Status: Normal (Preliminary result)   Collection Time   09/12/11 11:36 PM      Component Value Range Status Comment   Specimen Description BLOOD LEFT ARM   Final    Special Requests BOTTLES DRAWN AEROBIC AND ANAEROBIC 3CC EA   Final    Culture  Setup Time [id number]   Final    Culture     Final    Value:        BLOOD CULTURE RECEIVED NO GROWTH TO DATE CULTURE WILL BE HELD FOR  5 DAYS BEFORE ISSUING A FINAL NEGATIVE REPORT   Report Status PENDING   Incomplete   CULTURE, RESPIRATORY     Status: Normal   Collection Time   09/13/11 12:05 AM      Component Value Range Status Comment   Specimen Description TRACHEAL ASPIRATE   Final    Special Requests NONE   Final    Gram Stain     Final    Value: MODERATE WBC PRESENT, PREDOMINANTLY PMN     FEW SQUAMOUS EPITHELIAL CELLS PRESENT     MODERATE GRAM NEGATIVE RODS     FEW GRAM POSITIVE COCCI IN PAIRS   Culture     Final    Value: MODERATE STAPHYLOCOCCUS AUREUS     Note: RIFAMPIN AND GENTAMICIN SHOULD NOT BE USED AS SINGLE DRUGS FOR TREATMENT OF STAPH INFECTIONS.   Report Status 09/16/2011 FINAL   Final    Organism ID, Bacteria STAPHYLOCOCCUS AUREUS   Final   CLOSTRIDIUM DIFFICILE BY PCR     Status: Normal   Collection Time   09/13/11 12:00 PM      Component Value Range Status Comment   C difficile by pcr NEGATIVE  NEGATIVE  Final   MRSA PCR SCREENING     Status: Normal   Collection Time   09/13/11  4:06 PM      Component Value Range Status  Comment   MRSA by PCR NEGATIVE  NEGATIVE  Final     Anti-infectives     Start     Dose/Rate Route Frequency Ordered Stop   09/15/11 1000   vancomycin (VANCOCIN) 1,250 mg in sodium chloride 0.9 % 250 mL IVPB        1,250 mg 166.7 mL/hr over 90 Minutes Intravenous Every 8 hours 09/15/11 0854     09/11/11 0900   cefTRIAXone (ROCEPHIN) 1 g in dextrose 5 % 50 mL IVPB        1 g 100 mL/hr over 30 Minutes Intravenous Every 24 hours 09/11/11 0842     09/11/11 0845   cefTRIAXone (ROCEPHIN) injection 1 g  Status:  Discontinued        1 g Intramuscular Every 24 hours 09/11/11 0834 09/11/11 0842          Assessment: 46 year old on D2 of vancomycin for rule out pneumonia.  Vancomycin trough less than 5  Goal of Therapy:  Vancomycin trough level 15-20 mcg/ml  Plan:  1) Increase vancomycin to 1500 mg iv Q 8 hours 2) Follow up vancomycin level in 2 to 3 days if therapy continues  Thank you.  Elwin Sleight 09/16/2011,10:41 PM

## 2011-09-16 NOTE — Progress Notes (Addendum)
Patient ID: Mark [name], male   DOB: 01-17-1966, 46 y.o.   MRN: [id number] Subjective: Patient reports no change. Ready for surgery tomorrow.  Objective: Vital signs in last 24 hours: Temp:  [99.6 F (37.6 C)-101.2 F (38.4 C)] 100 F (37.8 C) (03/20 1200) Pulse Rate:  [60-108] 60  (03/20 1300) Resp:  [13-30] 18  (03/20 1300) BP: (124-168)/(65-92) 126/72 mmHg (03/20 1300) SpO2:  [96 %-100 %] 100 % (03/20 1300) FiO2 (%):  [30 %] 30 % (03/20 1300) Weight:  [125.2 kg (276 lb 0.3 oz)] 125.2 kg (276 lb 0.3 oz) (03/20 0500)  Intake/Output from previous day: 03/19 0701 - 03/20 0700 In: 3029.7 [I.V.:794.7; NG/GT:1235; IV Piggyback:900] Out: 2045 [Urine:2045] Intake/Output this shift: Total I/O In: 1100.1 [I.V.:195.1; Other:395; NG/GT:210; IV Piggyback:300] Out: 535 [Urine:535]  exam unchanged  Lab Results: Lab Results  Component Value Date   WBC 12.4* 09/16/2011   HGB 9.6* 09/16/2011   HCT 30.2* 09/16/2011   MCV 89.6 09/16/2011   PLT 245 09/16/2011   No results found for this basename: INR, PROTIME   BMET Lab Results  Component Value Date   NA 145 09/16/2011   K 3.9 09/16/2011   CL 107 09/16/2011   CO2 30 09/16/2011   GLUCOSE 130* 09/16/2011   BUN 21 09/16/2011   CREATININE 0.64 09/16/2011   CALCIUM 9.6 09/16/2011    Studies/Results: Dg Chest Port 1 View  09/16/2011  *RADIOLOGY REPORT*  Clinical Data: On ventilator, follow-up airspace disease  PORTABLE CHEST - 1 VIEW  Comparison: Portable chest x-ray of 09/15/2011  Findings: The lungs remain poorly aerated with basilar atelectasis. Cardiomegaly is stable. There may be mild pulmonary vascular congestion present.  Endotracheal tube is present with the tip approximately 3.1 cm above the carina.  IMPRESSION: Little change in poor aeration, cardiomegaly, and possible mild pulmonary vascular congestion.  Original Report Authenticated By: Juline Patch, M.D.   Dg Chest Port 1 View  09/15/2011  *RADIOLOGY REPORT*  Clinical Data: Air  space disease.  PORTABLE CHEST - 1 VIEW  Comparison: 09/14/2011.  Findings: Endotracheal tube tip 3 cm above the carina.  Right PICC line tip projects at the expected level of the mid superior vena cava.  Evaluation slightly limited by patient's habitus.  Nasogastric tube courses below the diaphragm.  The tip is not included on this exam.  Cardiomegaly.  Pulmonary vascular congestion/pulmonary edema.  Difficult to exclude basilar infiltrate as versus atelectatic changes.  IMPRESSION: No significant change.  Original Report Authenticated By: Fuller Canada, M.D.    Assessment/Plan: To OR tomorrow for ACDF C4-5, C5-6   LOS: 6 days    Lorieann Argueta S 09/16/2011, 2:43 PM      Unfortunately, this note was written on the wrong patient. This does not pertain to Mr. Danker.

## 2011-09-17 ENCOUNTER — Inpatient Hospital Stay (HOSPITAL_COMMUNITY): Payer: BC Managed Care – PPO

## 2011-09-17 LAB — BASIC METABOLIC PANEL
BUN: 21 mg/dL (ref 6–23)
Calcium: 9.3 mg/dL (ref 8.4–10.5)
Creatinine, Ser: 0.5 mg/dL (ref 0.50–1.35)
GFR calc non Af Amer: >90 mL/min (ref >90)
Glucose, Bld: 126 mg/dL — ABNORMAL HIGH (ref 70–99)
Sodium: 146 mEq/L — ABNORMAL HIGH (ref 135–145)

## 2011-09-17 LAB — CBC
Platelets: 252 10*3/uL (ref 150–400)
RBC: 3.05 MIL/uL — ABNORMAL LOW (ref 4.22–5.81)
RDW: 14.1 % (ref 11.5–15.5)
WBC: 11.5 10*3/uL — ABNORMAL HIGH (ref 4.0–10.5)

## 2011-09-17 MED ORDER — CEFAZOLIN SODIUM 1-5 GM-% IV SOLN
1.0000 g | Freq: Three times a day (TID) | INTRAVENOUS | Status: AC
  Administered 2011-09-17 – 2011-09-20 (×11): 1 g via INTRAVENOUS
  Filled 2011-09-17 (×12): qty 50

## 2011-09-17 MED ORDER — BIOTENE DRY MOUTH MT LIQD: 15.0000 mL | Freq: Four times a day (QID) | OROMUCOSAL | Status: DC

## 2011-09-17 MED ORDER — BIOTENE DRY MOUTH MT LIQD
15.0000 mL | Freq: Four times a day (QID) | OROMUCOSAL | Status: DC
  Administered 2011-09-17 – 2011-10-08 (×79): 15 mL via OROMUCOSAL

## 2011-09-17 NOTE — Progress Notes (Signed)
UR of chart updated. Family meeting with mother planned for today at 1230. Spoke with Brandi in Financial Counseling to begin proceedings for Medicaid/Disability.

## 2011-09-17 NOTE — Progress Notes (Signed)
Patient ID: Mark [name], male   DOB: 02/04/1966, 46 y.o.   MRN: [id number] Patient will be disabled for greater than one year.

## 2011-09-17 NOTE — Progress Notes (Signed)
Patient ID: Mark [name], male   DOB: 01/05/1966, 46 y.o.   MRN: [id number] The trauma team including myself, our PA, case manager, and social worker met with the patient's mother, sister, and grandmother in the chapel.  We reviewed the current medical status of the patient and discussed goals of care.  I described the option of trach/PEG with plan after that to place patient in a skilled nursing facility, vent-dependent or off the vent.  Our social worker let them know if the patient remains vent-dependent, facilities that would accept him are far away.  I also offered the option to make the patient a DNR and explained this would not alter current care.  We answered their questions.  They are going to talk things over as a family and let us know their wishes.  Violeta Gelinas, MD, MPH, FACS Pager: [phone]

## 2011-09-17 NOTE — Progress Notes (Signed)
Clinical Child psychotherapist met with patient family, MD, PA, RN and case manager in the chapel outside of 3300 at 12:30 to discuss patient current medical status and plans for future care and eventual discharge plans.  Patient mother was able to gain an understanding of the overview for patient care as far as patient hospital course and discharge plans.  We explained the different levels of care at discharge pending patient neurologic and respiratory improvement.  Patient family offered the option of Trach/PEG for patient next week as well as code status.  Clinical Social Worker discussed the different options for discharge once patient medically stable.  CSW informed patient family that due to patient lack of insurance coverage options would be limited on or off the ventilator.  Patient mother plans to meet with financial counselor to begin Medicaid/Disability applications on patient behalf.  Patient family seemed understanding and realistic about patient plan at discharge pending his gradual improvements, and continue to pray for a "miracle."    Clinical Social Worker will continue to be available for emotional support and discharge planning needs.  [address] [place], [place] [phone]
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
## 2011-09-18 ENCOUNTER — Inpatient Hospital Stay (HOSPITAL_COMMUNITY): Payer: BC Managed Care – PPO

## 2011-09-18 DIAGNOSIS — N179 Acute kidney failure, unspecified: Secondary | ICD-10-CM

## 2011-09-18 DIAGNOSIS — M7989 Other specified soft tissue disorders: Secondary | ICD-10-CM

## 2011-09-18 LAB — CBC
MCH: 27.7 pg (ref 26.0–34.0)
MCHC: 31.1 g/dL (ref 30.0–36.0)
MCV: 89 fL (ref 78.0–100.0)
Platelets: 280 10*3/uL (ref 150–400)
RBC: 3 MIL/uL — ABNORMAL LOW (ref 4.22–5.81)
RDW: 14.3 % (ref 11.5–15.5)

## 2011-09-18 LAB — BASIC METABOLIC PANEL
CO2: 31 mEq/L (ref 19–32)
Calcium: 9.3 mg/dL (ref 8.4–10.5)
Creatinine, Ser: 0.52 mg/dL (ref 0.50–1.35)
GFR calc non Af Amer: >90 mL/min (ref >90)
Sodium: 146 mEq/L — ABNORMAL HIGH (ref 135–145)

## 2011-09-18 LAB — BLOOD GAS, ARTERIAL
Acid-Base Excess: 5.2 mmol/L — ABNORMAL HIGH (ref 0.0–2.0)
Drawn by: 35135
FIO2: 30 %
MECHVT: 550 mL
O2 Saturation: 97.6 %
PEEP: 8 cmH2O
Patient temperature: 98.6
RATE: 18 resp/min
pO2, Arterial: 98.9 mmHg (ref 80.0–100.0)

## 2011-09-18 MED ORDER — FUROSEMIDE 8 MG/ML PO SOLN
20.0000 mg | Freq: Two times a day (BID) | ORAL | Status: DC
  Administered 2011-09-18 – 2011-09-24 (×12): 20 mg
  Filled 2011-09-18: qty 5
  Filled 2011-09-18: qty 2
  Filled 2011-09-18: qty 5
  Filled 2011-09-18: qty 2
  Filled 2011-09-18 (×3): qty 5
  Filled 2011-09-18: qty 2
  Filled 2011-09-18 (×7): qty 5

## 2011-09-18 MED ORDER — CLONAZEPAM 0.1 MG/ML ORAL SUSPENSION
1.0000 mg | Freq: Three times a day (TID) | ORAL | Status: DC
  Filled 2011-09-18 (×4): qty 10

## 2011-09-18 MED ORDER — CLONAZEPAM 1 MG PO TABS
1.0000 mg | ORAL_TABLET | Freq: Three times a day (TID) | ORAL | Status: DC
  Administered 2011-09-18 – 2011-09-22 (×13): 1 mg via ORAL
  Filled 2011-09-18 (×13): qty 1

## 2011-09-18 MED ORDER — QUETIAPINE FUMARATE 100 MG PO TABS
100.0000 mg | ORAL_TABLET | Freq: Three times a day (TID) | ORAL | Status: DC
  Administered 2011-09-18 – 2011-09-23 (×16): 100 mg via ORAL
  Filled 2011-09-18 (×20): qty 1

## 2011-09-18 NOTE — Progress Notes (Signed)
No significant change.  Wilmon Arms. Corliss Skains, MD, [facility] Surgery  09/18/2011 10:28 AM
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
## 2011-09-18 NOTE — Progress Notes (Signed)
Patient ID: Mark [name], male   DOB: [birth year], 46 y.o.   MRN: [id number] No change in exam. Likely will need trach/ peg. Neuro stable. Opens eyes to stimulation, occ follows commands on R, L densely weak. Head CT next week. Following.

## 2011-09-18 NOTE — Progress Notes (Signed)
VASCULAR LAB PRELIMINARY  PRELIMINARY  PRELIMINARY  PRELIMINARY  Right upper extremity and bilateral lower extremity venous duplex  completed.    Technically difficult due to sub optimal position and respiratory interference from the ventilator Preliminary report:  Right:  No obvious evidence of DVT, superficial thrombosis, or Baker's cyst .Left:  No obvious evidence of DVT, superficial thrombosis, or Baker's cyst.  Right : No obvious evdience of upper exrtemity DVT or superficial thrombus.  Shamyia Grandpre D, RVS 09/18/2011, 3:43 PM

## 2011-09-18 NOTE — Progress Notes (Signed)
Clinical Social Worker received follow up phone call from patient mother questioning the process of POA or Guardianship on patient behalf.  CSW explained to patient mother that POA would not be possible at this time due to patient current mental status and the inability to sign any type of documents.  Per CSW supervisor, Herbie Drape is best obtained through an attorney.  CSW informed patient mother - patient mother plans to contact attorney to begin this process on patient behalf.  Patient remains sedated on the vent at this time.  Clinical Social Worker will continue to remain available for support and discharge planning.  [address] [place], [place] [phone]

## 2011-09-18 NOTE — Progress Notes (Signed)
Nutrition Follow-up  Diet Order:  NPO  Meds: Scheduled Meds:   . antiseptic oral rinse  15 mL Mouth Rinse QID  . artificial tears   Both Eyes Q8H  .  ceFAZolin (ANCEF) IV  1 g Intravenous Q8H  . chlorhexidine  15 mL Mouth Rinse BID  . clonazePAM  1 mg Oral Q8H  . feeding supplement  60 mL Per Tube QID  . furosemide  20 mg Per Tube BID  . metoprolol tartrate  25 mg Oral BID  . pantoprazole sodium  40 mg Per Tube Q1200  . QUEtiapine  100 mg Oral Q8H  . sodium chloride  10-40 mL Intracatheter Q12H  . vitamin C  1,000 mg Per Tube TID  . vitamin e  400 Units Per Tube TID  . DISCONTD: clonazePAM  1 mg Oral Q8H  . DISCONTD: clonazePAM  1 mg Oral Q8H   Continuous Infusions:   . 0.9 % NaCl with KCl 20 mEq / L 10 mL/hr at 09/18/11 0900  . feeding supplement (PIVOT 1.5 CAL) 1,000 mL (09/17/11 0737)  . fentaNYL infusion INTRAVENOUS 75 mcg/hr (09/18/11 0900)  . propofol 15 mcg/kg/min (09/18/11 0935)   PRN Meds:.acetaminophen (TYLENOL) oral liquid 160 mg/5 mL, fentaNYL, sodium chloride  Labs:  CMP     Component Value Date/Time   NA 146* 09/18/2011 0355   K 3.8 09/18/2011 0355   CL 109 09/18/2011 0355   CO2 31 09/18/2011 0355   GLUCOSE 122* 09/18/2011 0355   BUN 23 09/18/2011 0355   CREATININE 0.52 09/18/2011 0355   CALCIUM 9.3 09/18/2011 0355   PROT 6.1 09/10/2011 1313   ALBUMIN 3.2* 09/10/2011 1313   AST 28 09/10/2011 1313   ALT 18 09/10/2011 1313   ALKPHOS 63 09/10/2011 1313   BILITOT 0.3 09/10/2011 1313   GFRNONAA >90 09/18/2011 0355   GFRAA >90 09/18/2011 0355  CBG (last 3)  No results found for this basename: GLUCAP:3 in the last 72 hours   Intake/Output Summary (Last 24 hours) at 09/18/11 0955 Last data filed at 09/18/11 0935  Gross per 24 hour  Intake 2344.72 ml  Output   1630 ml  Net 714.72 ml   Flexiseal 325 ml 3/21 MV: 13.7 Temp: 37.6 on cooling blanket Propofol started 3/20; currently at 11.3 ml/hr providing 298 kcals/day Per pt's RN sedation being changed today and RN  plans to begin weaning propofol therefore will not make changes to TF at this time.  Pivot 1.5 @ 35 ml/hr with 60 ml Prostat QID providing 1836 kcals (86% of kcals needs) , 198 grams protein (>100% of protein needs), and 638 ml H2O  Weight Status:  279 lbs 3/22     274 lbs 3/21     276 lbs 3/20  268 lbs on admission; per MD will start to get some fluid off pt  Re-estimated needs:  2490 kcals; >187 grams protein  Nutrition Dx:  Inadequate oral intake (NI-2.1). Status: Ongoing  Goal:  Enteral nutrition to provide 60-70% of estimated calorie needs (22-25 kcals/kg ideal body weight) and 100% of estimated protein needs, based on ASPEN guidelines for permissive underfeeding in critically ill obese individuals; met  Intervention:    If unable to d/c propofol recommend decrease TF to 30 ml/hr and continue the 60 ml Prostat QID, would provide 1954 kcals and 187 grams protein with current propofol rate.  Monitor:  TF adequacy/tolerance, extubation, weight changes, labs   Kendell Bane Cornelison Pager #:  [phone]

## 2011-09-19 ENCOUNTER — Inpatient Hospital Stay (HOSPITAL_COMMUNITY): Payer: BC Managed Care – PPO

## 2011-09-19 DIAGNOSIS — J189 Pneumonia, unspecified organism: Secondary | ICD-10-CM

## 2011-09-19 DIAGNOSIS — D62 Acute posthemorrhagic anemia: Secondary | ICD-10-CM

## 2011-09-19 DIAGNOSIS — K661 Hemoperitoneum: Secondary | ICD-10-CM

## 2011-09-19 LAB — CULTURE, BLOOD (ROUTINE X 2)
Culture  Setup Time: 201303170204
Culture: NO GROWTH

## 2011-09-19 LAB — CBC
HCT: 26.9 % — ABNORMAL LOW (ref 39.0–52.0)
MCHC: 31.6 g/dL (ref 30.0–36.0)
MCV: 90.3 fL (ref 78.0–100.0)
Platelets: 304 10*3/uL (ref 150–400)
RDW: 14 % (ref 11.5–15.5)
WBC: 9.1 10*3/uL (ref 4.0–10.5)

## 2011-09-19 LAB — BLOOD GAS, ARTERIAL
Bicarbonate: 31 mEq/L — ABNORMAL HIGH (ref 20.0–24.0)
PEEP: 8 cmH2O
TCO2: 32.5 mmol/L (ref 0–100)
pH, Arterial: 7.437 (ref 7.350–7.450)
pO2, Arterial: 102 mmHg — ABNORMAL HIGH (ref 80.0–100.0)

## 2011-09-19 LAB — TRIGLYCERIDES: Triglycerides: 169 mg/dL — ABNORMAL HIGH (ref <150)
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
## 2011-09-21 MED ORDER — FERROUS SULFATE 300 (60 FE) MG/5ML PO SYRP
300.0000 mg | ORAL_SOLUTION | Freq: Three times a day (TID) | ORAL | Status: DC
  Administered 2011-09-21 – 2011-09-22 (×4): 300 mg via ORAL
  Filled 2011-09-21 (×9): qty 5

## 2011-09-21 MED ORDER — FERROUS SULFATE 220 (44 FE) MG/5ML PO ELIX: 220.0000 mg | ORAL_SOLUTION | Freq: Three times a day (TID) | ORAL | Status: DC

## 2011-09-21 MED ORDER — VITAL 1.5 CAL PO LIQD
1000.0000 mL | ORAL | Status: DC
  Administered 2011-09-21: 1000 mL
  Filled 2011-09-21 (×4): qty 1000

## 2011-09-21 NOTE — Progress Notes (Signed)
UR of chart complete.  
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
## 2011-09-21 NOTE — Progress Notes (Signed)
Weaning better with Klonopin and Seroquel Trach/PEG later this week in OR Patient examined and I agree with the assessment and plan  Violeta Gelinas, MD, MPH, FACS Pager: [phone]  09/21/2011 8:57 AM

## 2011-09-22 ENCOUNTER — Inpatient Hospital Stay (HOSPITAL_COMMUNITY): Payer: BC Managed Care – PPO

## 2011-09-22 LAB — CBC
HCT: 29.5 % — ABNORMAL LOW (ref 39.0–52.0)
Hemoglobin: 9.1 g/dL — ABNORMAL LOW (ref 13.0–17.0)
RBC: 3.28 MIL/uL — ABNORMAL LOW (ref 4.22–5.81)

## 2011-09-22 LAB — BASIC METABOLIC PANEL
CO2: 30 mEq/L (ref 19–32)
Chloride: 108 mEq/L (ref 96–112)
Glucose, Bld: 115 mg/dL — ABNORMAL HIGH (ref 70–99)
Potassium: 4.3 mEq/L (ref 3.5–5.1)
Sodium: 146 mEq/L — ABNORMAL HIGH (ref 135–145)

## 2011-09-22 MED ORDER — CLONAZEPAM 1 MG PO TABS
2.0000 mg | ORAL_TABLET | Freq: Three times a day (TID) | ORAL | Status: DC
  Administered 2011-09-22 – 2011-09-24 (×6): 2 mg via ORAL
  Filled 2011-09-22 (×6): qty 2

## 2011-09-22 NOTE — Progress Notes (Signed)
Clinical Social Worker received follow up phone call from patient mother questioning again the process of Guardianship on patient behalf. Per CSW supervisor, Herbie Drape is best obtained through an attorney. CSW informed patient mother - patient mother plans to contact attorney to begin this process on patient behalf.  Patient mother currently seeking legal aid to begin process and will follow up with CSW to provide attorney information and permission to share medical information. Patient remains sedated on the vent at this time.  Patient mother understanding of Trach/PEG placement on Thursday March 28 and remains hopeful that patient will wean from the ventilator.  Clinical Social Worker will continue to remain available for support and discharge planning.   [address] [place], [place]  [phone]

## 2011-09-22 NOTE — Progress Notes (Signed)
Per radiologist's report PICC is looped.   Powerflushed grey & red ports with 30cc NS each to get PICC to drop lower into SVC.   HOB increased.  PCXR will be obtained in 30 min. To check placement.   Primary RN made aware.

## 2011-09-22 NOTE — Plan of Care (Signed)
Problem: Phase I Progression Outcomes Goal: Cervical/thoracic/lumbar spine cleared Outcome: Progressing Pt unable to perform flex ex x ray    Goal: Voiding-avoid urinary catheter unless indicated Outcome: Not Applicable Date Met:  09/22/11 Needs foley
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
## 2011-09-22 NOTE — Progress Notes (Signed)
Chaplain Note:   Chaplain made follow up visit with pt and pt's mother.  Pt was in bed intubated, and did not interact during this visit.  [name] is experiencing weariness and stress over pt's progress.  There are also financial concerns.  Chaplain provided spiritual comfort, support, and prayer for pt's mother and pt.  [name] expressed appreciation for chaplain support.  Chaplain will continue to follow this case.   09/22/11 1015  Clinical Encounter Type  Visited With Patient and family together  Visit Type Follow-up;Spiritual support;Critical Care  Referral From Nurse  Spiritual Encounters  Spiritual Needs Emotional;Prayer  Stress Factors  Patient Stress Factors Health changes;Major life changes  Family Stress Factors Exhausted;Financial concerns;Lack of knowledge    Verdie Shire, chaplain resident [phone]

## 2011-09-22 NOTE — Progress Notes (Signed)
Patient ID: Mark [name], male   DOB: Jun 02, 1966, 46 y.o.   MRN: [id number] Patient remained sedated and intubated. Still open eyes and occasionally follow commands with his right upper extremity per nurse's report. Scheduled for trach and peg tomorrow.

## 2011-09-23 ENCOUNTER — Inpatient Hospital Stay (HOSPITAL_COMMUNITY): Payer: BC Managed Care – PPO

## 2011-09-23 ENCOUNTER — Encounter (HOSPITAL_COMMUNITY): Payer: Self-pay

## 2011-09-23 ENCOUNTER — Encounter (HOSPITAL_COMMUNITY): Admission: EM | Disposition: A | Discharge status: Rehabilitation Facility | Payer: Self-pay | Source: Ambulatory Visit

## 2011-09-23 HISTORY — PX: PERCUTANEOUS TRACHEOSTOMY: SHX5288

## 2011-09-23 LAB — CBC
HCT: 30.8 % — ABNORMAL LOW (ref 39.0–52.0)
Hemoglobin: 9.6 g/dL — ABNORMAL LOW (ref 13.0–17.0)
MCV: 91.4 fL (ref 78.0–100.0)
Platelets: 370 10*3/uL (ref 150–400)
RBC: 3.37 MIL/uL — ABNORMAL LOW (ref 4.22–5.81)
WBC: 9.9 10*3/uL (ref 4.0–10.5)

## 2011-09-23 LAB — BASIC METABOLIC PANEL
BUN: 24 mg/dL — ABNORMAL HIGH (ref 6–23)
CO2: 30 mEq/L (ref 19–32)
Chloride: 108 mEq/L (ref 96–112)
Creatinine, Ser: 0.69 mg/dL (ref 0.50–1.35)
Glucose, Bld: 111 mg/dL — ABNORMAL HIGH (ref 70–99)

## 2011-09-23 SURGERY — CREATION, TRACHEOSTOMY, PERCUTANEOUS
Anesthesia: General | Site: Neck | Wound class: Clean Contaminated

## 2011-09-23 MED ORDER — CEFAZOLIN SODIUM 1-5 GM-% IV SOLN
INTRAVENOUS | Status: DC | PRN
  Administered 2011-09-23: 2 g via INTRAVENOUS

## 2011-09-23 MED ORDER — ROCURONIUM BROMIDE 100 MG/10ML IV SOLN
INTRAVENOUS | Status: DC | PRN
  Administered 2011-09-23: 50 mg via INTRAVENOUS

## 2011-09-23 MED ORDER — FERROUS SULFATE 300 (60 FE) MG/5ML PO SYRP
300.0000 mg | ORAL_SOLUTION | Freq: Three times a day (TID) | ORAL | Status: DC
  Administered 2011-09-23 – 2011-10-08 (×45): 300 mg
  Filled 2011-09-23 (×53): qty 5

## 2011-09-23 MED ORDER — CEFAZOLIN SODIUM 1-5 GM-% IV SOLN
INTRAVENOUS | Status: AC
  Filled 2011-09-23: qty 100

## 2011-09-23 MED ORDER — VITAL AF 1.2 CAL PO LIQD
1000.0000 mL | ORAL | Status: DC
  Filled 2011-09-23 (×3): qty 1000

## 2011-09-23 MED ORDER — MIDAZOLAM HCL 5 MG/5ML IJ SOLN
INTRAMUSCULAR | Status: DC | PRN
  Administered 2011-09-23: 2 mg via INTRAVENOUS

## 2011-09-23 MED ORDER — METOPROLOL TARTRATE 25 MG/10 ML ORAL SUSPENSION
25.0000 mg | Freq: Two times a day (BID) | ORAL | Status: DC
  Administered 2011-09-23 – 2011-09-25 (×5): 25 mg
  Filled 2011-09-23 (×5): qty 10

## 2011-09-23 MED ORDER — FENTANYL CITRATE 0.05 MG/ML IJ SOLN
INTRAMUSCULAR | Status: DC | PRN
  Administered 2011-09-23: 100 ug via INTRAVENOUS

## 2011-09-23 MED ORDER — PROPOFOL 10 MG/ML IV EMUL
INTRAVENOUS | Status: DC | PRN
  Administered 2011-09-23: 40 mg via INTRAVENOUS

## 2011-09-23 MED ORDER — SODIUM CHLORIDE 0.9 % IR SOLN
Status: DC | PRN
  Administered 2011-09-23: 1000 mL

## 2011-09-23 MED ORDER — VECURONIUM BROMIDE 10 MG IV SOLR
INTRAVENOUS | Status: DC | PRN
  Administered 2011-09-23: 10 mg via INTRAVENOUS

## 2011-09-23 MED ORDER — QUETIAPINE FUMARATE 100 MG PO TABS
100.0000 mg | ORAL_TABLET | Freq: Three times a day (TID) | ORAL | Status: DC
  Administered 2011-09-23 – 2011-09-24 (×3): 100 mg
  Filled 2011-09-23 (×6): qty 1

## 2011-09-23 MED ORDER — PRO-STAT SUGAR FREE PO LIQD
60.0000 mL | Freq: Four times a day (QID) | ORAL | Status: DC
  Administered 2011-09-23 – 2011-09-24 (×4): 60 mL
  Filled 2011-09-23 (×7): qty 60

## 2011-09-23 MED ORDER — SODIUM CHLORIDE 0.9 % IV SOLN
INTRAVENOUS | Status: DC | PRN
  Administered 2011-09-23: 12:00:00 via INTRAVENOUS

## 2011-09-23 SURGICAL SUPPLY — 24 items
DRAPE PROXIMA HALF (DRAPES) ×4 IMPLANT
DRAPE UTILITY 15X26 W/TAPE STR (DRAPE) ×4 IMPLANT
ELECT CAUTERY BLADE 6.4 (BLADE) ×2 IMPLANT
ELECT REM PT RETURN 9FT ADLT (ELECTROSURGICAL) ×2
ELECTRODE REM PT RTRN 9FT ADLT (ELECTROSURGICAL) ×1 IMPLANT
GAUZE SPONGE 4X4 16PLY XRAY LF (GAUZE/BANDAGES/DRESSINGS) ×2 IMPLANT
GLOVE BIO SURGEON STRL SZ 6.5 (GLOVE) ×2 IMPLANT
GLOVE BIO SURGEON STRL SZ7 (GLOVE) ×2 IMPLANT
GLOVE BIO SURGEON STRL SZ8 (GLOVE) ×4 IMPLANT
GLOVE BIOGEL PI IND STRL 8 (GLOVE) ×2 IMPLANT
GLOVE BIOGEL PI INDICATOR 8 (GLOVE) ×3
GLOVE ECLIPSE 6.5 STRL STRAW (GLOVE) ×1 IMPLANT
GLOVE ECLIPSE 7.5 STRL STRAW (GLOVE) ×3 IMPLANT
GLOVE SURG SS PI 6.5 STRL IVOR (GLOVE) ×1 IMPLANT
GOWN PREVENTION PLUS XLARGE (GOWN DISPOSABLE) ×1 IMPLANT
GOWN STRL NON-REIN LRG LVL3 (GOWN DISPOSABLE) ×6 IMPLANT
INTRODUCER TRACH BLUE RHINO 6F (TUBING) IMPLANT
INTRODUCER TRACH BLUE RHINO 8F (TUBING) ×1 IMPLANT
PENCIL BUTTON HOLSTER BLD 10FT (ELECTRODE) ×2 IMPLANT
SPONGE INTESTINAL PEANUT (DISPOSABLE) ×2 IMPLANT
SUT VICRYL AB 3 0 TIES (SUTURE) ×2 IMPLANT
TOWEL OR 17X24 6PK STRL BLUE (TOWEL DISPOSABLE) ×4 IMPLANT
TUBE CONNECTING 12X1/4 (SUCTIONS) ×2 IMPLANT
YANKAUER SUCT BULB TIP NO VENT (SUCTIONS) ×2 IMPLANT
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
## 2011-09-23 NOTE — Progress Notes (Signed)
Chaplain Note:  Chaplain made follow up visit with pt and family.  Pt was in bed, intubated.  He had just returned from a tracheostomy procedure.  [name] and one other family member were at bedside.  Chaplain provided spiritual comfort and support for pt and pt's family.  Pt's family expressed appreciation for chaplain support.  Chaplain will continue to follow this case.   09/23/11 1300  Clinical Encounter Type  Visited With Patient and family together  Visit Type Spiritual support;Follow-up  Spiritual Encounters  Spiritual Needs Emotional  Stress Factors  Patient Stress Factors Health changes;Major life changes  Family Stress Factors Exhausted;Financial concerns;Lack of knowledge   Verdie Shire, chaplain resident [phone]

## 2011-09-23 NOTE — OR Nursing (Signed)
Peg tube placement by endoscopy started at 1158.
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
## 2011-09-23 NOTE — Preoperative (Signed)
Beta Blockers   Reason not to administer Beta Blockers:Not Applicable 

## 2011-09-23 NOTE — Progress Notes (Signed)
Nutrition Follow-up  Diet Order:  NPO  Patient's TF regimen discontinued for tracheostomy and PEG placement. PEG placement started 12PM on 3/27 per RN   Pivot 1.5 at goal rate of 35 ml/hr via OGT was discontinued due to loose BMs per MD note. TF was replaced with Vital 1.5 at 35 ml/hr, and 60 ml Prostat QID. This regimen provided 2466 kcal (160% re-estimated goal energy needs) , 205 gram protein (100% re-estimated protein needs), and 966 ml free water.  30 ml gastric residuals per doc flowsheets. Last loose BM documented on 3/27. No additional wounds to document.   Meds: Scheduled Meds:   . antiseptic oral rinse  15 mL Mouth Rinse QID  . artificial tears   Both Eyes Q8H  . chlorhexidine  15 mL Mouth Rinse BID  . clonazePAM  2 mg Oral Q8H  . feeding supplement  60 mL Per Tube QID  . ferrous sulfate  300 mg Oral TID WC  . furosemide  20 mg Per Tube BID  . metoprolol tartrate  25 mg Oral BID  . pantoprazole sodium  40 mg Per Tube Q1200  . QUEtiapine  100 mg Oral Q8H  . sodium chloride  10-40 mL Intracatheter Q12H   Continuous Infusions:   . 0.9 % NaCl with KCl 20 mEq / L 20 mL/hr at 09/23/11 0800  . feeding supplement (VITAL 1.5 CAL) 1,000 mL (09/21/11 1149)  . fentaNYL infusion INTRAVENOUS Stopped (09/23/11 0729)  . propofol Stopped (09/18/11 1630)   PRN Meds:.acetaminophen (TYLENOL) oral liquid 160 mg/5 mL, fentaNYL, sodium chloride  Labs:  CMP     Component Value Date/Time   NA 146* 09/23/2011 0500   K 4.5 09/23/2011 0500   CL 108 09/23/2011 0500   CO2 30 09/23/2011 0500   GLUCOSE 111* 09/23/2011 0500   BUN 24* 09/23/2011 0500   CREATININE 0.69 09/23/2011 0500   CALCIUM 9.7 09/23/2011 0500   PROT 6.1 09/10/2011 1313   ALBUMIN 3.2* 09/10/2011 1313   AST 28 09/10/2011 1313   ALT 18 09/10/2011 1313   ALKPHOS 63 09/10/2011 1313   BILITOT 0.3 09/10/2011 1313   GFRNONAA >90 09/23/2011 0500   GFRAA >90 09/23/2011 0500   CBG (last 3)  No results found for this basename: GLUCAP:3 in the  last 72 hours    Intake/Output Summary (Last 24 hours) at 09/23/11 1051 Last data filed at 09/23/11 0800  Gross per 24 hour  Intake 792.42 ml  Output   3025 ml  Net -2232.58 ml    RN reported patient's loose BM's had improved of this morning. Was unsure of when TF will be initiated, as MD had not documented PEG placement procedure or nutrition support time frame  Weight Status:  251 lbs (114.2 kg). Weight has decreased 26 lbs from previous RD note on 3/22. Admission weight: 268 lbs   Re-estimated needs:    2555- 2655 kcal (Goal per ASPEN permissive underfeeding guidelines: 1530-1780 kcal)  187 gm protein Ve: 12.5 Temp: 37.8 C  Nutrition Dx:  Inadequate oral intake (NI-2.1). Status: Ongoing  Goal:  Enteral nutrition to provide 60-70% of estimated calorie needs (22-25 kcals/kg ideal body weight) and 100% of estimated protein needs, based on ASPEN guidelines for permissive underfeeding in critically ill obese individuals; unmet   Intervention:   Discontinue Vital 1.5.  When specified per MD, initiate Vital AF 1.2 at 20 ml/hr via OGT and increase 10 ml/hr every four hours to goal rate of 25 ml/hr. Continue to supplement 60  ml Pro-Stat QID. This will provide 1656 kcal (65% of estimated energy needs), 188 gm protein (100% estimated protein needs, and 730 ml free water  Monitor: TF initiation/ tolerance, BM's, I/Os, weights, labs   Lloyd Huger Pager #:  [phone]  Kendell Bane Cornelison [phone]
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
## 2011-09-23 NOTE — Progress Notes (Signed)
Clinical Social Worker spoke with patient mother and patient best friend at bedside to provide continued emotional support and discuss patient current condition.  Patient mother agreeable to patient plans for Trach/PEG placement today in the OR.  Patient mother remains hopeful for patient recovery and especially to wean from vent support once trach is placed.  Patient mother continues to be aware of placement options pending patient medical progress.    Patient mother is continuing to follow up with legal aide regarding Guardianship, however does have some hesitation due to the wording of the documents that have to be signed.  Patient mother states that she is uncomfortable with the extensive use of the word "incompetent" regarding patient mental status.  CSW and patient mother discussed the benefits and concerns of continuing to pursue Guardianship on patient behalf.  As patient mother has already discovered, she has limited involvement in patient finances without obtaining Guardianship.  Patient mother plans to continue to discover options/alternatives with family and update CSW with any further findings.  Patient mother continues to express her appreciation for CSW involvement.  Clinical Social Worker will continue to follow for emotional support and discharge planning needs.  [address] [place], [place] [phone]

## 2011-09-23 NOTE — Progress Notes (Signed)
Pt discussed at hospital LOS meeting today.  

## 2011-09-23 NOTE — Transfer of Care (Signed)
Immediate Anesthesia Transfer of Care Note  Patient: Mark [name]  Procedure(s) Performed: Procedure(s) (LRB): PERCUTANEOUS TRACHEOSTOMY (N/A)  Patient Location: NICU  Anesthesia Type: General  Level of Consciousness: unresponsive  Airway & Oxygen Therapy: Patient remains intubated per anesthesia plan and Patient placed on Ventilator (see vital sign flow sheet for setting)  Post-op Assessment: Report given to PACU RN and Post -op Vital signs reviewed and stable  Post vital signs: Reviewed and stable  Complications: No apparent anesthesia complications
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
## 2011-09-24 ENCOUNTER — Encounter (HOSPITAL_COMMUNITY): Payer: Self-pay | Admitting: General Surgery

## 2011-09-24 ENCOUNTER — Inpatient Hospital Stay (HOSPITAL_COMMUNITY): Payer: BC Managed Care – PPO

## 2011-09-24 ENCOUNTER — Encounter (HOSPITAL_COMMUNITY): Admission: EM | Disposition: A | Discharge status: Rehabilitation Facility | Payer: Self-pay | Source: Ambulatory Visit

## 2011-09-24 SURGERY — CREATION, TRACHEOSTOMY, PERCUTANEOUS
Anesthesia: General

## 2011-09-24 MED ORDER — CLONAZEPAM 1 MG PO TABS
1.5000 mg | ORAL_TABLET | Freq: Three times a day (TID) | ORAL | Status: DC
  Administered 2011-09-24 – 2011-09-25 (×3): 1.5 mg
  Filled 2011-09-24 (×3): qty 1

## 2011-09-24 MED ORDER — HYDROCODONE-ACETAMINOPHEN 7.5-500 MG/15ML PO SOLN
10.0000 mL | ORAL | Status: DC | PRN
  Administered 2011-09-24 – 2011-10-05 (×8): 10 mL
  Filled 2011-09-24 (×4): qty 15
  Filled 2011-09-24: qty 30
  Filled 2011-09-24 (×3): qty 15

## 2011-09-24 MED ORDER — HYDROCODONE-ACETAMINOPHEN 7.5-500 MG/15ML PO SOLN: 10.0000 mL | ORAL | Status: DC | PRN

## 2011-09-24 MED ORDER — QUETIAPINE FUMARATE 50 MG PO TABS
50.0000 mg | ORAL_TABLET | Freq: Three times a day (TID) | ORAL | Status: DC
  Administered 2011-09-24 – 2011-09-25 (×3): 50 mg
  Filled 2011-09-24 (×6): qty 1

## 2011-09-24 MED ORDER — VITAL AF 1.2 CAL PO LIQD
1000.0000 mL | ORAL | Status: DC
  Administered 2011-09-24: 1000 mL
  Filled 2011-09-24: qty 1000

## 2011-09-24 MED ORDER — VITAL AF 1.2 CAL PO LIQD
1000.0000 mL | ORAL | Status: DC
  Administered 2011-09-26 – 2011-10-07 (×15): 1000 mL
  Filled 2011-09-24 (×34): qty 1000

## 2011-09-24 MED ORDER — GUAIFENESIN 100 MG/5ML PO SOLN
20.0000 mL | Freq: Four times a day (QID) | ORAL | Status: DC
  Administered 2011-09-24 – 2011-10-05 (×43): 400 mg
  Filled 2011-09-24 (×54): qty 20
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
## 2011-09-24 NOTE — Progress Notes (Signed)
Patient ID: Mark [name], male   DOB: [birth year], 46 y.o.   MRN: [id number] Subjective: Patient stable. Opens eyes to stimulation. Flexes on RUE, occ FC?  Objective: Vital signs in last 24 hours: Temp:  [97.4 F (36.3 C)-100.3 F (37.9 C)] 99.3 F (37.4 C) (03/28 0800) Pulse Rate:  [80-110] 98  (03/28 0458) Resp:  [16-33] 23  (03/28 0900) BP: (109-146)/(51-106) 129/68 mmHg (03/28 0900) SpO2:  [97 %-100 %] 100 % (03/28 0900) FiO2 (%):  [28 %-40 %] 28 % (03/28 0900) Weight:  [114.7 kg (252 lb 13.9 oz)] 114.7 kg (252 lb 13.9 oz) (03/28 0500)  Intake/Output from previous day: 03/27 0701 - 03/28 0700 In: 977.6 [I.V.:977.6] Out: 2795 [Urine:2675] Intake/Output this shift: Total I/O In: 60 [I.V.:20; Other:40] Out: 640 [Urine:640]  stable neuro exam s/p trach/peg  Lab Results: Lab Results  Component Value Date   WBC 9.9 09/23/2011   HGB 9.6* 09/23/2011   HCT 30.8* 09/23/2011   MCV 91.4 09/23/2011   PLT 370 09/23/2011   No results found for this basename: INR, PROTIME   BMET Lab Results  Component Value Date   NA 146* 09/23/2011   K 4.5 09/23/2011   CL 108 09/23/2011   CO2 30 09/23/2011   GLUCOSE 111* 09/23/2011   BUN 24* 09/23/2011   CREATININE 0.69 09/23/2011   CALCIUM 9.7 09/23/2011    Studies/Results: Dg Chest Port 1 View  09/24/2011  *RADIOLOGY REPORT*  Clinical Data: Rule out infiltrates and assess lines  PORTABLE CHEST - 1 VIEW  Comparison: 09/23/2011  Findings: The tracheostomy tip is positioned 3.8 cm above the level of the carina.  The right peripheral central venous catheter is in place and has a coil within the line directed up into the right internal jugular vein.  The tip however is positioned at the inferior aspect of the internal jugular vein directed towards the superior vena cava.  This position is unchanged in comparison to the prior exam.  Lower lung volumes are present today with resultant crowding of bronchovascular markings at both lung bases.  Increased  density at the bases is likely due to atelectasis given the low lung volumes but basilar pneumonia can not be completely excluded.  Pulmonary vascular congestion without overt congestive failure is seen.  No pleural fluid or interstitial septal lines are noted.  IMPRESSION: Decreasing lung volumes with new bibasilar density likely reflecting resulting atelectasis.  Right subclavian CVP with the coiling of the tip in the right internal jugular vein.  These results will be called to the ordering clinician or representative by the Radiologist Assistant, and communication documented in the PACS Dashboard.  Original Report Authenticated By: Bertha Stakes, M.D.   Dg Chest Port 1 View  09/23/2011  *RADIOLOGY REPORT*  Clinical Data: Tracheostomy tube placement.  PORTABLE CHEST - 1 VIEW 09/23/2011 1406 hours:  Comparison: Portable chest x-ray yesterday and dating back to 09/18/2011.  Findings: New tracheostomy tube tip in satisfactory position below the thoracic inlet approximately 6 cm above the carina.  No evidence of pneumomediastinum.  Right arm PICC remains looped in the right internal jugular vein, with its tip in the lower IJ. Interval worsening of atelectasis in the lung bases, at least in part related to a less than optimal inspiration.  Cardiac silhouette upper normal in size to slightly enlarged but stable.  IMPRESSION:  1.  Tracheostomy tube tip in satisfactory position approximately 6 cm above the carina.  No evidence of pneumomediastinum. 2.  Right arm PICC remains  looped in the right internal jugular vein with its tip in the lower IJ. 3.  Worsening atelectasis in the lung bases.  Original Report Authenticated By: Arnell Sieving, M.D.   Dg Chest Port 1 View  09/22/2011  *RADIOLOGY REPORT*  Clinical Data: PICC line repositioning.  PORTABLE CHEST - 1 VIEW 09/22/2011 1242 hours:  Comparison: Portable chest x-ray earlier same date 0600 hours.  Findings: Right arm PICC extends up into the right  internal jugular vein, looped with its tip in the lower right internal jugular vein, similar to the prior examination.  Endotracheal tube tip remains in satisfactory position approximately 6 cm above the carina. Nasogastric tube courses below the diaphragm into the stomach. Interval improvement in aeration throughout both lungs, with only mild bibasilar atelectasis persisting.  No new pulmonary parenchymal abnormalities.  Cardiac silhouette enlarged but stable. Pulmonary vascularity normal.  IMPRESSION:  1.  Right arm PICC remains looped in the right internal jugular vein, with its tip in the lower right IJ.  The appearance is unchanged from earlier in the day. 2.  Remaining support apparatus satisfactory. 3.  Improved aeration in both lungs, with only mild bibasilar atelectasis persisting.  No new abnormalities.  Original Report Authenticated By: Arnell Sieving, M.D.    Assessment/Plan: Stable. Head CT tomorrow   LOS: 14 days    Pheng Prokop S 09/24/2011, 9:36 AM

## 2011-09-24 NOTE — Anesthesia Postprocedure Evaluation (Addendum)
  Anesthesia Post-op Note  Patient: Mark [name]  Procedure(s) Performed: Procedure(s) (LRB): PERCUTANEOUS TRACHEOSTOMY (N/A)  Patient Location: ICU  Anesthesia Type: General  Level of Consciousness: sedated and unresponsive  Airway and Oxygen Therapy: Patient remains intubated per anesthesia plan  Post-op Pain: none  Post-op Assessment: Post-op Vital signs reviewed, Patient's Cardiovascular Status Stable, Respiratory Function Stable and Patent Airway  Post-op Vital Signs: Reviewed and stable  Complications: No apparent anesthesia complications

## 2011-09-24 NOTE — Progress Notes (Signed)
Nutrition Follow-up  Diet Order:  NPO  Patient was extubated on 3/28 and is tolerating trach collar. Still experiencing low grade fevers. No loose BMs documented. Plan for head CT tomorrow. RN reports patient tolerating TF initiation well.  Patient is currently receiving Vital AF 1.2 at 10 ml/hr via PEG tube and supplemented with 60 ml Pro-Stat QID. This is providing 864 kcal (35% re-estimated energy needs), 138 gram protein (99% re-estimated protein needs), and 195 ml free water.  Not yet reached goal rate of 25 ml/hr.   Meds: Scheduled Meds:   . antiseptic oral rinse  15 mL Mouth Rinse QID  . artificial tears   Both Eyes Q8H  . chlorhexidine  15 mL Mouth Rinse BID  . clonazePAM  1.5 mg Per Tube Q8H  . feeding supplement  60 mL Per Tube QID  . ferrous sulfate  300 mg Per Tube TID WC  . furosemide  20 mg Per Tube BID  . guaiFENesin  20 mL Per Tube Q6H  . metoprolol tartrate  25 mg Per Tube BID  . pantoprazole sodium  40 mg Per Tube Q1200  . QUEtiapine  50 mg Per Tube Q8H  . sodium chloride  10-40 mL Intracatheter Q12H  . DISCONTD: clonazePAM  2 mg Oral Q8H  . DISCONTD: feeding supplement  60 mL Per Tube QID  . DISCONTD: feeding supplement (VITAL AF 1.2 CAL)  1,000 mL Per Tube Q24H  . DISCONTD: ferrous sulfate  300 mg Oral TID WC  . DISCONTD: metoprolol tartrate  25 mg Oral BID  . DISCONTD: QUEtiapine  100 mg Oral Q8H  . DISCONTD: QUEtiapine  100 mg Per Tube Q8H   Continuous Infusions:   . 0.9 % NaCl with KCl 20 mEq / L 20 mL/hr at 09/24/11 0900  . feeding supplement (VITAL AF 1.2 CAL) 1,000 mL (09/24/11 0917)  . fentaNYL infusion INTRAVENOUS 50 mcg/hr (09/23/11 2000)  . DISCONTD: feeding supplement (VITAL 1.5 CAL) 1,000 mL (09/21/11 1149)  . DISCONTD: propofol Stopped (09/18/11 1630)   PRN Meds:.acetaminophen (TYLENOL) oral liquid 160 mg/5 mL, fentaNYL, sodium chloride, DISCONTD: sodium chloride irrigation  Labs:  CMP     Component Value Date/Time   NA 146* 09/23/2011 0500     K 4.5 09/23/2011 0500   CL 108 09/23/2011 0500   CO2 30 09/23/2011 0500   GLUCOSE 111* 09/23/2011 0500   BUN 24* 09/23/2011 0500   CREATININE 0.69 09/23/2011 0500   CALCIUM 9.7 09/23/2011 0500   PROT 6.1 09/10/2011 1313   ALBUMIN 3.2* 09/10/2011 1313   AST 28 09/10/2011 1313   ALT 18 09/10/2011 1313   ALKPHOS 63 09/10/2011 1313   BILITOT 0.3 09/10/2011 1313   GFRNONAA >90 09/23/2011 0500   GFRAA >90 09/23/2011 0500     Intake/Output Summary (Last 24 hours) at 09/24/11 1144 Last data filed at 09/24/11 1100  Gross per 24 hour  Intake 1035.16 ml  Output   2910 ml  Net -1874.84 ml    To comply with ASPEN permissive underfeeding guidelines in the obese critically ill, Vital 1.2 AF at goal rate of 25 ml/hr and 60 ml ProStat QID would provide 1656 kcal (66% of re-estimated energy needs), 188 gm protein (134% re-estimated protein needs, and 730 ml free water. Due to patient's extubation, and trach collar tolerance, no longer meets criteria for this guideline.   Weight Status:  252 lbs (114.7 kg). Down 16 lbs from admission weight, may be related to fluid fluccuations  Re-estimated needs:  2500 - 2700 kcal, 140-150 gm protein   Nutrition Dx:  Inadequate oral intake (NI-2.1). Status: Ongoing  Goal:  Provide >/=90% of estimated needs, unmet  Intervention:  Increase Vital AF 1.2 by 10 ml/hr every four hours to new goal rate of 85 ml/hr via PEG tube. This will provide 2448 kcal (98% estimated energy needs), 153 gram protein (100% estimated protein needs), and 1654 ml free water  Discontinue Pro-Stat supplementation  If unable to tolerate TF goal rate, RD to consider higher calorically dense formula.   Monitor:  TF tolerance, weights, labs, I/Os, BMs   Lloyd Huger Pager #:  [phone]  Kendell Bane [place]el[place] [phone]

## 2011-09-24 NOTE — Progress Notes (Signed)
Comfortable on HTC now No MVT on my exam today Trach site minimal drainage Patient examined and I agree with the assessment and plan  Violeta Gelinas, MD, MPH, FACS Pager: [phone]  09/24/2011 8:51 AM

## 2011-09-24 NOTE — Progress Notes (Signed)
UR of chart complete.  

## 2011-09-25 ENCOUNTER — Inpatient Hospital Stay (HOSPITAL_COMMUNITY): Payer: BC Managed Care – PPO

## 2011-09-25 LAB — TRIGLYCERIDES: Triglycerides: 171 mg/dL — ABNORMAL HIGH (ref <150)

## 2011-09-25 LAB — BASIC METABOLIC PANEL
CO2: 29 mEq/L (ref 19–32)
GFR calc non Af Amer: >90 mL/min (ref >90)
Glucose, Bld: 124 mg/dL — ABNORMAL HIGH (ref 70–99)
Potassium: 4.4 mEq/L (ref 3.5–5.1)
Sodium: 143 mEq/L (ref 135–145)

## 2011-09-25 LAB — URINE CULTURE: Culture  Setup Time: 201303281611

## 2011-09-25 LAB — CBC
Hemoglobin: 11 g/dL — ABNORMAL LOW (ref 13.0–17.0)
MCHC: 31.3 g/dL (ref 30.0–36.0)
Platelets: 349 10*3/uL (ref 150–400)
RBC: 3.9 MIL/uL — ABNORMAL LOW (ref 4.22–5.81)

## 2011-09-25 MED ORDER — QUETIAPINE FUMARATE 25 MG PO TABS
25.0000 mg | ORAL_TABLET | Freq: Three times a day (TID) | ORAL | Status: DC
  Administered 2011-09-25 – 2011-09-29 (×12): 25 mg
  Filled 2011-09-25 (×16): qty 1

## 2011-09-25 MED ORDER — METOPROLOL TARTRATE 25 MG/10 ML ORAL SUSPENSION
50.0000 mg | Freq: Two times a day (BID) | ORAL | Status: DC
  Administered 2011-09-26 – 2011-10-08 (×25): 50 mg
  Filled 2011-09-25 (×26): qty 20

## 2011-09-25 MED ORDER — AMOXICILLIN-POT CLAVULANATE 875-125 MG PO TABS
1.0000 | ORAL_TABLET | Freq: Two times a day (BID) | ORAL | Status: AC
  Administered 2011-09-25 – 2011-10-04 (×20): 1
  Filled 2011-09-25 (×20): qty 1

## 2011-09-25 MED ORDER — CLONAZEPAM 0.5 MG PO TABS
1.0000 mg | ORAL_TABLET | Freq: Three times a day (TID) | ORAL | Status: DC
  Administered 2011-09-25 – 2011-09-27 (×6): 1 mg
  Filled 2011-09-25 (×3): qty 2
  Filled 2011-09-25 (×4): qty 1

## 2011-09-25 MED ORDER — METOPROLOL TARTRATE 25 MG/10 ML ORAL SUSPENSION
25.0000 mg | Freq: Once | ORAL | Status: AC
  Administered 2011-09-25: 25 mg

## 2011-09-25 MED ORDER — OXYMETAZOLINE HCL 0.05 % NA SOLN
1.0000 | Freq: Two times a day (BID) | NASAL | Status: AC
  Administered 2011-09-25 – 2011-10-01 (×14): 1 via NASAL
  Filled 2011-09-25: qty 15

## 2011-09-25 MED ORDER — FREE WATER
100.0000 mL | Freq: Four times a day (QID) | Status: DC
  Administered 2011-09-25 – 2011-09-30 (×22): 100 mL

## 2011-09-25 NOTE — Progress Notes (Signed)
Not following commands.  No real change in status.  Mark [name]. Gae Bon, MD, FACS [phone] Trauma Surgeon
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
## 2011-09-25 NOTE — Plan of Care (Signed)
Problem: Phase I Progression Outcomes Goal: Other Phase I Outcomes/Goals Outcome: Progressing TBI Team evaluation completed  Demonstrates Rancho Coma recovery level II (generalized response) with Emerging Rancho Coma recovery level III (localized)

## 2011-09-25 NOTE — Progress Notes (Signed)
Notified MD on call (Dr. Lindie Spruce) of pt's sustained tachycardia (130s-140s), copious secretions, and tachypnea (RR 30s-40). Orders received, will continue to monitor.
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
## 2011-09-26 LAB — CBC
MCH: 28 pg (ref 26.0–34.0)
Platelets: 359 10*3/uL (ref 150–400)
RBC: 3.79 MIL/uL — ABNORMAL LOW (ref 4.22–5.81)
WBC: 15.1 10*3/uL — ABNORMAL HIGH (ref 4.0–10.5)

## 2011-09-26 LAB — BASIC METABOLIC PANEL
CO2: 27 mEq/L (ref 19–32)
Calcium: 10 mg/dL (ref 8.4–10.5)
Chloride: 106 mEq/L (ref 96–112)
Sodium: 143 mEq/L (ref 135–145)
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
## 2011-09-27 ENCOUNTER — Inpatient Hospital Stay (HOSPITAL_COMMUNITY): Payer: BC Managed Care – PPO

## 2011-09-27 LAB — CBC
MCV: 89.2 fL (ref 78.0–100.0)
Platelets: 329 10*3/uL (ref 150–400)
RBC: 3.52 MIL/uL — ABNORMAL LOW (ref 4.22–5.81)
WBC: 9.9 10*3/uL (ref 4.0–10.5)

## 2011-09-27 LAB — CULTURE, RESPIRATORY W GRAM STAIN

## 2011-09-27 MED ORDER — CLONAZEPAM 0.5 MG PO TABS
0.5000 mg | ORAL_TABLET | Freq: Two times a day (BID) | ORAL | Status: DC
  Administered 2011-09-27 – 2011-09-28 (×3): 0.5 mg
  Filled 2011-09-27 (×3): qty 1
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
## 2011-09-28 ENCOUNTER — Inpatient Hospital Stay (HOSPITAL_COMMUNITY): Payer: Self-pay

## 2011-09-28 ENCOUNTER — Encounter (HOSPITAL_COMMUNITY): Payer: Self-pay | Admitting: Radiology

## 2011-09-28 LAB — PROTIME-INR: INR: 1.02 (ref 0.00–1.49)

## 2011-09-28 MED ORDER — FENTANYL CITRATE 0.05 MG/ML IJ SOLN
INTRAMUSCULAR | Status: AC
  Filled 2011-09-28: qty 2

## 2011-09-28 MED ORDER — IOHEXOL 300 MG/ML  SOLN
150.0000 mL | Freq: Once | INTRAMUSCULAR | Status: AC | PRN
  Administered 2011-09-28: 45 mL via INTRAVENOUS

## 2011-09-28 MED ORDER — FENTANYL CITRATE 0.05 MG/ML IJ SOLN
INTRAMUSCULAR | Status: AC
  Administered 2011-09-28: 50 ug
  Filled 2011-09-28: qty 2

## 2011-09-28 NOTE — Progress Notes (Signed)
09/28/11 Call from CSW stating patient's mom wants LTAC referral prior to SNF search.  Referral called to Kathlene November with [facility] and Ginni with [facility].  Per [name] [facility] is in network with patient's insurance.  In to speak with mom and explain process.  Mom refused simultaneous SNF search until we had an LTAC decision. Jim Like RN BSN CCM

## 2011-09-28 NOTE — Progress Notes (Signed)
Occupational Therapy Treatment Patient Details Name: Mark [name] MRN: [id number] DOB: [birth year] Today's Date: 09/28/2011  OT Assessment/Plan OT Assessment/Plan Comments on Treatment Session: Pt progressing this session Rancho Coma Recovery level III .  OT Plan: Discharge plan remains appropriate OT Frequency: Min 1X/week Follow Up Recommendations: Skilled nursing facility Equipment Recommended: Defer to next venue OT Goals Acute Rehab OT Goals OT Goal Formulation: Patient unable to participate in goal setting Miscellaneous OT Goals Miscellaneous OT Goal #1: Pt will follow 1 step simple command 2 out 4 attempts  OT Goal: Miscellaneous Goal #1 - Progress: Progressing toward goals Miscellaneous OT Goal #2: Pt will demostrate Rancho Coma Recovery III (localized response) 2 out 4 trials  OT Goal: Miscellaneous Goal #2 - Progress: Progressing toward goals  OT Treatment Precautions/Restrictions  Precautions Precautions: Fall (peg trach ) Restrictions Weight Bearing Restrictions: No   ADL ADL Eating/Feeding: NPO Grooming: Performed;+1 Total assistance Where Assessed - Grooming: Supine, head of bed up ADL Comments: Pt with loose void of stool dark in color on arrival. Pt following simple command "thumbs UP" and turning head to name call. Pt with large volume of muscus secretions. Pt demonstrates Rancho Coma recovery level III (localized response).  Mobility  Bed Mobility Bed Mobility: Yes Rolling Right: 1: +2 Total assist;Patient percentage (comment) (0%) Rolling Right Details (indicate cue type and reason): Pt lifting Rt UE with v/c cue however non purposeful movement. Pt required pad to facilitate bed mobility.  Rolling Left: 1: +2 Total assist Rolling Left Details (indicate cue type and reason): Pt required pad to facilitate bed mobility.  Supine to Sit:  (0%) Transfers Transfers: No    End of Session OT - End of Session Equipment Utilized During Treatment: Cervical  collar Activity Tolerance: Patient tolerated treatment well Patient left: in bed;with call bell in reach;with family/visitor present (mother) General Behavior During Session: Flat affect Cognition: Impaired  Co tx with Mark [name] (PT) Mark [name] to request extra ccollar vista pads and notified RN (Mark [name]) Pt with LB PROM with bathing of LB s/p void bowel. Pt provided total (A) for all bathing by tech.  Pt requires total +2 Pt 0% to log roll.   Mark [name] Mark [name]  09/28/2011, 1:12 PM Mark [name]: [phone]
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
## 2011-09-28 NOTE — Procedures (Signed)
Procedure:  IVC filter placement Findings:  Normally patent IVC on venography.  Cook Celect IVC filter placed in infrarenal IVC via right femoral venous access.
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
## 2011-09-28 NOTE — Progress Notes (Signed)
Physical Therapy Treatment Patient Details Name: Mark [name] MRN: [id number] DOB: 22-Jun-1966 Today's Date: 09/28/2011  PT Assessment/Plan  PT - Assessment/Plan Comments on Treatment Session: Pt very fatigued after rolling x 4 due to loose BM.  Pt able to track to left and right to sound and cues.  Pt with (+) nystagmus to the right when rolling to the right side.  Pt with increase respiratory rate and decrease SaO2 when rolling to the right side and was returned to supine.  Pt with forceful productive cough episodes decreasing mobility.  Pt able to give thumbs up ~50% of the time on command. PT Plan: Discharge plan remains appropriate;Frequency remains appropriate PT Frequency: Min 2X/week Follow Up Recommendations: Skilled nursing facility Equipment Recommended: Defer to next venue PT Goals  Acute Rehab PT Goals PT Goal Formulation: Patient unable to participate in goal setting Time For Goal Achievement: 2 weeks Additional Goals Additional Goal #1: Pt will consistently demonstrate Rancho level III responses (localized response) and emerging Rancho level IV (confused, agitated). PT Goal: Additional Goal #1 - Progress: Progressing toward goal Additional Goal #2: Pt will demonstrate withdrawal to pain with Rt extremities >75% of trials and will spontaneously move Rt extremities during times of incr alertness. PT Goal: Additional Goal #2 - Progress: Progressing toward goal  PT Treatment Precautions/Restrictions  Precautions Precautions: Fall (peg trach ) Restrictions Weight Bearing Restrictions: No Mobility (including Balance) Bed Mobility Bed Mobility: Yes Rolling Right: 1: +2 Total assist;Patient percentage (comment) (0%) Rolling Right Details (indicate cue type and reason): Pt lifting Rt UE with v/c cue however non purposeful movement. Pt required pad to facilitate bed mobility.  Rolling Left: 1: +2 Total assist Rolling Left Details (indicate cue type and reason):   Pt required  pad to facilitate bed mobility.   Supine to Sit:  (0%)  Balance Balance Assessed: No Exercise    End of Session PT - End of Session Activity Tolerance: Patient limited by fatigue (pt with several productive cough episodes limiting mobility) Patient left: in bed General Behavior During Session: Flat affect Cognition: Impaired  Jarrod Bodkins 09/28/2011, 1:37 PM Jake Shark, PT DPT [phone]

## 2011-09-28 NOTE — H&P (Signed)
Agree 

## 2011-09-28 NOTE — Plan of Care (Signed)
Problem: Discharge Progression Outcomes Goal: Ranchos Scale Level I - No Response Level II - Generalized Response Level III - Localized Response Level IV- Confused-Agitated Level V- Confused, Inappropriate, Non-Agitated Level VI- Confused-Appropriate Level VII- Automatic-Appropriate Level VIII- Purposeful-Appropriate  Outcome: Progressing Ranch Coma recovery level III

## 2011-09-29 DIAGNOSIS — J189 Pneumonia, unspecified organism: Secondary | ICD-10-CM

## 2011-09-29 DIAGNOSIS — S069X9A Unspecified intracranial injury with loss of consciousness of unspecified duration, initial encounter: Secondary | ICD-10-CM

## 2011-09-29 DIAGNOSIS — J95821 Acute postprocedural respiratory failure: Secondary | ICD-10-CM

## 2011-09-29 MED ORDER — CLONAZEPAM 0.5 MG PO TABS
0.5000 mg | ORAL_TABLET | Freq: Every day | ORAL | Status: DC
  Administered 2011-09-29 – 2011-10-07 (×9): 0.5 mg
  Filled 2011-09-29 (×9): qty 1

## 2011-09-29 NOTE — Progress Notes (Signed)
4 Sutures removed.  Pt remains to have copious amounts of secretions.  No skin breakdown noted.  SPO2 94%.  Janina Mayo ties changed.  C-Spine held by pts RN.  Pt tolerated procedure well.
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
## 2011-09-29 NOTE — Progress Notes (Signed)
OT Cancellation Note  Treatment cancelled today due to:  Pt transferring back to ICU currently due to secretions per RN staff.    Harrel Carina [place]   OTR/L Pager: [phone] Office: [phone] .

## 2011-09-29 NOTE — Progress Notes (Signed)
Clinical Social Worker continuing to follow patient for emotional support and discharge planning needs.  CSW spoke with patient mother over the phone regarding patient current insurance coverage with [facility].  Patient insurance policy was terminated in January 2013 - patient mother now aware.  Patient mother and case manager have contacted insurance company regarding COBRA policy on patient behalf.  Patient mother was informed that COBRA paperwork could be completed by her on patient behalf and 3 months of premiums paid in order to reinstate current insurance coverage.  Patient mother plans to collect financial assistance from family/friends to cover premiums and make payments in a reasonable amount of time.  Per CM, if patient insurance reinstated, patient may qualify for LTAC at [facility] in [place].  [facility] will evaluate patient for medical approval.  Patient mother understanding that back up plan will be skilled nursing facility.  Patient mother feels confident that she will be able to obtain the appropriate fiances to reinstate patient insurance.  Clinical Social Worker will continue to follow for emotional support and discharge planning needs.  [address] [place], [place] [phone]

## 2011-09-29 NOTE — Progress Notes (Signed)
Patient transferred to 3030. Report called to RN on 3000 and all questions answered. Patient transferred via bed with RN, VVS. Patient's mother notified of transfer and new room number.

## 2011-09-30 ENCOUNTER — Telehealth (HOSPITAL_COMMUNITY): Payer: Self-pay

## 2011-09-30 LAB — GLUCOSE, CAPILLARY: Glucose-Capillary: 120 mg/dL — ABNORMAL HIGH (ref 70–99)

## 2011-09-30 MED ORDER — FREE WATER
200.0000 mL | Freq: Four times a day (QID) | Status: DC
  Administered 2011-09-30 – 2011-10-08 (×31): 200 mL

## 2011-09-30 MED ORDER — WHITE PETROLATUM GEL
Status: AC
  Administered 2011-09-30: 0.2
  Filled 2011-09-30: qty 5

## 2011-09-30 NOTE — Progress Notes (Signed)
Occupational Therapy Treatment Patient Details Name: Mark [name] MRN: [id number] DOB: [birth year] Today's Date: 09/30/2011  OT Assessment/Plan OT Assessment/Plan Comments on Treatment Session: Pt with no active ROM noted on Lt side demonstrating Lt hemiplegia. Pt actively moving Rt UE and LE. Pt following simple commands on right side. OT Plan: Discharge plan needs to be updated OT Frequency: Min 3X/week Follow Up Recommendations: Inpatient Rehab Equipment Recommended: Defer to next venue OT Goals Acute Rehab OT Goals OT Goal Formulation: Patient unable to participate in goal setting Time For Goal Achievement: 2 weeks ADL Goals Pt Will Perform Grooming: with mod assist;Sitting, edge of bed;with cueing (comment type and amount) (hand over hand ) ADL Goal: Grooming - Progress: Goal set today Miscellaneous OT Goals Miscellaneous OT Goal #1: Pt will follow 1 step simple command 2 out 4 attempts  OT Goal: Miscellaneous Goal #1 - Progress: Progressing toward goals Miscellaneous OT Goal #2: Pt will demostrate Rancho Coma Recovery III (localized response) 2 out 4 trials  OT Goal: Miscellaneous Goal #2 - Progress: Met  OT Treatment Precautions/Restrictions  Precautions Precautions: Fall (peg and trach)   ADL ADL Eating/Feeding: NPO Grooming: Performed;Wash/dry hands;Teeth care;Maximal assistance (hand over hand to initiate and continue task) Grooming Details (indicate cue type and reason): Pt scratching face with Rt UE with support of elbow and scratching abdomen. Pt with very dry skin noted. RN Morrie Sheldon) informed. Pt with biting during oral care and teeth clenched. Where Assessed - Grooming: Sitting, bed;Supported Ambulation Related to ADLs: Pt with bed elevated so pt could weight bearing sitting EOB on BIL LE. Pt demonstrating hip flexor activation of Rt LE.  ADL Comments: Pt supine<>sit EOB with total +3 pt 0% with HOB 45 degrees. Pt required additional staff due to height and weight  for safe transfer. Pt sitting EOB with Max (A) and demonstrating truck activation by pulling UB forward. Pt following simple command of thumbs up, reaching for mother and objects on Rt side, pt visually attending to objects on Rt side and eyes not passing midline, and brushing/washing face with hand over hand this session. Pt is demonstraing a solid rancho coma recovery III (localized response). Mother very excited and tearful seeing pt sitting up. pt sat EOB ~20 minutes.  Mobility  Bed Mobility Bed Mobility: Yes Supine to Sit: 1: +2 Total assist Transfers Transfers: No Exercises    End of Session OT - End of Session Equipment Utilized During Treatment: Cervical collar Activity Tolerance: Patient tolerated treatment well Patient left: in bed;with call bell in reach;with family/visitor present General Behavior During Session: Flat affect Cognition: Impaired  Lucile Shutters  09/30/2011, 3:09 PM Pager: [phone]

## 2011-09-30 NOTE — Progress Notes (Signed)
Speech Pathology:  Treatment Note  Subjective:  Patient in bed,  alert, eyes open immediately when called by name.  Objective:  Treatment focused on skilled observation and cognitive skills development. Patient presenting localized responses characterized by ability to achieve eye contact with speaker when called by name in 50% of attempts, 2-3 periods of brief sustained attention today lasting approximately 7-10 seconds during completion of basic functional ADLs (face washing, teeth brushing) in which he required mod-max [facility] assist for complete initiation and carryout. Able to initiate carryout of basic 1 step commands related to functional familiar ADLs with 25% accuracy, requiring mod-max tactile cues for full carryout. Mild restlessness noted, scratching at both chest and stomach with mom present and confirming eczema. RN made aware. Suspect that some relief to itching may decrease mild restlessness. Education complete with both mom and RN regarding need for neuro rest following high levels of stimulation as during today's treatment session. Both verbalized understanding.   Assessment:  Mark [name]") presents with behaviors consistent with a Rancho Level III (localized response) today as noted above.   Recommendations:  1. Continue current plan of care 2. CIR consult 3. PMSV evaluation as secretions minimize: MD please order if agree.  Pain:   none Intervention Required:   No   Goals: Progressing  Ferdinand Lango MA, CCC-SLP [phone]

## 2011-09-30 NOTE — Progress Notes (Signed)
Physical Therapy Treatment Patient Details Name: Mark [name] MRN: [id number] DOB: 1965/11/08 Today's Date: 09/30/2011  PT Assessment/Plan  PT - Assessment/Plan Comments on Treatment Session: Pt able to follow command to give a thumbs up with his right hand, however did not follow command to show two fingers.  Patient tolerated sitting EOB well.  Pt demonstrate activation of the R hip flexor while sitting EOB.  Pt did not demonstrate any left sided activation. PT Plan: Discharge plan needs to be updated;Frequency needs to be updated PT Frequency: Min 3X/week Recommendations for Other Services: Rehab consult Follow Up Recommendations: Inpatient Rehab;LTACH (Based on patient pregression) Equipment Recommended: Defer to next venue PT Goals  Acute Rehab PT Goals PT Goal Formulation: Patient unable to participate in goal setting Time For Goal Achievement: 2 weeks Additional Goals Additional Goal #1: Pt will consistently demonstrate Rancho level III responses (localized response) and emerging Rancho level IV (confused, agitated). PT Goal: Additional Goal #1 - Progress: Progressing toward goal  PT Treatment Precautions/Restrictions  Precautions Precautions: Fall (PEG and Trach) Restrictions Weight Bearing Restrictions: No Mobility (including Balance) Bed Mobility Bed Mobility: Yes Supine to Sit: 1: +2 Total assist;Patient percentage (comment) (pt = 0%) Supine to Sit Details (indicate cue type and reason): Pt unable to assist with transfer to EOB. Sit to Supine: 1: +2 Total assist;Patient percentage (comment) (pt = 0%) Sit to Supine - Details (indicate cue type and reason): Pt unable to assist with transfer from sitting to supine. Transfers Transfers: No Ambulation/Gait Ambulation/Gait: No Stairs: No  Posture/Postural Control Posture/Postural Control: No significant limitations Balance Balance Assessed: Yes Static Sitting Balance Static Sitting - Balance Support: No upper  extremity supported;Feet supported Static Sitting - Level of Assistance: 2: Max assist Static Sitting - Comment/# of Minutes: ~25 minutes.  Pt able to scratch face with RUE with support of elbow and scratching abdomen.  Pt performed ADLs (brushing teeth and washing face) while sitting EOB with assistance and tactile cues to promote shoulder/elbow flexion. Exercise    End of Session PT - End of Session Activity Tolerance: Patient limited by fatigue Patient left: in bed;with family/visitor present Nurse Communication: Other (comment) (Scratching and dry skin) General Behavior During Session: Flat affect Cognition: Impaired  Ezzard Standing SPT 09/30/2011, 3:54 PM Jake Shark, PT DPT [phone]

## 2011-09-30 NOTE — Progress Notes (Signed)
Clinical Social Worker met with patient mother and case manager to discuss patient premiums for COBRA coverage and discharge options.  Patient mother will bring COBRA paperwork with her to the hospital tomorrow to submit to Lennox Solders, the financial counselor who plans to assist with follow through on premiums.  CM reiterated to patient mother that 3 months of premiums must be paid in advance in order to qualify to LTAC placement at [facility]. CSW contacted BC/BS to confirm premium rate on patient behalf.  The premium needed to cover patient medical expenses is $240.10/week which totals $3, 121.30 for coverage for January 20 - October 17, 2011.  In order to be considered for LTAC financially, patient mother must pay premiums for 3 months in advance (April 20 - January 16, 2012) which also totals $3,121.30.    Clinical Social Worker left message for patient mother to confirm premium rates and discuss COBRA paperwork.  Driscilla Grammes from [facility] is also involved to approve patient medically for admission to LTAC if financial's can be arranged.    Clinical Social Worker will continue to follow up with patient mother to offer support and guide with discharge planning needs.  [address] [place], [place] [phone]
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
## 2011-09-30 NOTE — Progress Notes (Signed)
Nutrition Follow-up  Diet Order:  NPO Last bm documented 4/2, large/loose Pt is s/p PEG/trach placement on 3/27.  Current TF is providing: 2448 kcals, 153 grams protein, 1652 ml H2O with additional 400 ml free water. Total free water 2052 ml H2O Spoke with pt's mother at pt's bedside. Pt on Vital product due to diarrhea in ICU. Will monitor and consider change if appropriate.  Meds: Scheduled Meds:   . amoxicillin-clavulanate  1 tablet Per Tube Q12H  . antiseptic oral rinse  15 mL Mouth Rinse QID  . artificial tears   Both Eyes Q8H  . chlorhexidine  15 mL Mouth Rinse BID  . clonazePAM  0.5 mg Per Tube QHS  . ferrous sulfate  300 mg Per Tube TID WC  . free water  100 mL Per Tube Q6H  . guaiFENesin  20 mL Per Tube Q6H  . metoprolol tartrate  50 mg Per Tube BID  . oxymetazoline  1 spray Each Nare BID  . pantoprazole sodium  40 mg Per Tube Q1200  . sodium chloride  10-40 mL Intracatheter Q12H  . white petrolatum       Continuous Infusions:   . 0.9 % NaCl with KCl 20 mEq / L 20 mL/hr at 09/29/11 1000  . feeding supplement (VITAL AF 1.2 CAL) 1,000 mL (09/30/11 1003)   PRN Meds:.acetaminophen (TYLENOL) oral liquid 160 mg/5 mL, HYDROcodone-acetaminophen, sodium chloride  Labs:  CMP     Component Value Date/Time   NA 143 09/26/2011 0400   K 4.1 09/26/2011 0400   CL 106 09/26/2011 0400   CO2 27 09/26/2011 0400   GLUCOSE 142* 09/26/2011 0400   BUN 24* 09/26/2011 0400   CREATININE 0.62 09/26/2011 0400   CALCIUM 10.0 09/26/2011 0400   PROT 6.1 09/10/2011 1313   ALBUMIN 3.2* 09/10/2011 1313   AST 28 09/10/2011 1313   ALT 18 09/10/2011 1313   ALKPHOS 63 09/10/2011 1313   BILITOT 0.3 09/10/2011 1313   GFRNONAA >90 09/26/2011 0400   GFRAA >90 09/26/2011 0400  TRIG: 128 4/1 CBG (last 3)   Basename 09/30/11 1145 09/30/11 0822  GLUCAP 139* 120*    Intake/Output Summary (Last 24 hours) at 09/30/11 1531 Last data filed at 09/30/11 1100  Gross per 24 hour  Intake      0 ml  Output   1650 ml    Net  -1650 ml    Weight Status:   09/29/11:  259 lbs 09/28/11:  270 lbs Weight range is 249-270 lbs  Re-estimated needs:  2500 - 2700 kcal, 140-150 gm protein   Nutrition Dx:  Inadequate oral intake (NI-2.1). Status: Ongoing Now related to inability to take po's AEB NPO status  Goal:  Provide >/=90% of estimated needs, met  Intervention:    Increase free water to 200 ml every 6 hours  Monitor:  Tf tolerance, weight, stools   Kendell Bane [place] Pager #:  [phone]

## 2011-09-30 NOTE — Progress Notes (Signed)
09/30/11 Call to [facility] to update them regarding patient's COBRA status and hope for possible discharge to LTAC once COBRA is activated.  Per [facility] they require 3 months of COBRA payments prior to admission.  Patient's mom is aware and is hoping to be able to provide.  Financial counselor Morrie Sheldon is also involved as well as Electrical engineer.  Jim Like RN BSN CCM

## 2011-10-01 LAB — GLUCOSE, CAPILLARY
Glucose-Capillary: 117 mg/dL — ABNORMAL HIGH (ref 70–99)
Glucose-Capillary: 128 mg/dL — ABNORMAL HIGH (ref 70–99)

## 2011-10-01 NOTE — Progress Notes (Signed)
Occupational Therapy Treatment Patient Details Name: Mark [name] MRN: [id number] DOB: 05-16-1966 Today's Date: 10/01/2011  OT Assessment/Plan OT Assessment/Plan Comments on Treatment Session: Pt with no active ROM noted on Lt side demonstrating Lt hemiplegia. Pt actively moving Rt UE and LE. Pt following simple commands on right side. (Pt demonstrates Hip flexor activation in tilt table) OT Goals Miscellaneous OT Goals Miscellaneous OT Goal #1: Pt will follow 1 step simple command 2 out 4 attempts  OT Goal: Miscellaneous Goal #1 - Progress: Progressing toward goals  OT Treatment Precautions/Restrictions  Precautions Precautions: Fall Restrictions Weight Bearing Restrictions: No   ADL ADL Eating/Feeding: NPO Equipment Used: Other (comment) (tilt table) ADL Comments: Pt total x 4 transfer from bed to tilt table. Pt positioned with strapes for safety. Pt supine<>stand in tilt table x 15 minutes with stable BP and vitals. Pt with x2 epsiodes of coughing with secretion production. Pt with decreased secretions this session. Pt's cousion "hart" present for pt standing in tilt table. pt following with Rt UE simple command (squeeze thumbs up/down) Pt tracking pt on Rt side only. Pt not following simple command to wave, smile, open mouth and with tactile input at mouth to open mouth pt demonstrates "Biting" Pt total+4 = 0% tilt table<> bed level. Pt repositioned and resting at end of session. Pt demonstrates [place] Com[place] recovery level III (localized response)  Harrel Carina [facility]  10/01/2011, 11:47 AM Pager: [phone]

## 2011-10-01 NOTE — Progress Notes (Signed)
10/01/11 CSW and CM met with patient's mother, brother and financial counselors this AM to discuss completion of documents and financing for COBRA benefits.  CSW and CM encouraged patient's family to consider an alternative discharge plan in case discharge to LTAC did not work out.  Both mom and brother refused having patient's information sent out for SNF consideration.  CSW clearly explained possible financial liability if MD considers patient medically stable for discharge and there is not a safe discharge plan due to family refusal.  Both mom and brother verbalized understanding, however they continued to refuse.  CM contacted Para March at [facility] to see if [facility] will accept a copy of checks for three months COBRA payments, versus having to wait for for BCBS to process.  Para March will check with Leadership at [facility]. Jim Like RN BSN CCM
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
## 2011-10-01 NOTE — Progress Notes (Signed)
Clinical Social Worker met with patient mother and brother in the financial counseling office with Marlene Lard and Lennox Solders present.  Patient mother was able to locate [facility] paperwork and was receiving assistance/advice on how and when the paperwork should be submitted from financial counseling.  Financial Counselors explained to patient mother and brother that COBRA paperwork needed to be completed in order for patient hospital stay to be covered under BCBS benefit as well as qualify for LTAC level of care at discharge.    Financial counseling is submitting the COBRA premiums for possible assistance with the past 3 months of premiums.  Patient mother and brother understand that in order to qualify for [facility], patient must have the next 3 months of premiums already paid for which totals $3,121.13.  Patient mother states that she should have the money by Tuesday October 06, 2011 - patient mother plans to submit her piece of financial obligation to financial counselors at that time.    Case Manager [employer]) plans to contact [facility] admissions to confirm their policy regarding payment of COBRA premiums prior admission.  Per financial counselor it could take several weeks to process paperwork and payment with BCBS.  As a system we are inquiring to [facility] about possible copies of documentation for proof of payment of COBRA premiums to expedite discharge process.  Clinical Social Worker thoroughly explained the skilled nursing facility process once again.  Patient mother and brother continue to adamantly refuse the option of SNF for patient at discharge.  CSW and CM comprehensively explained the financial obligation that may fall on patient family  and risk of patient acquiring an infection due to patient extended stay after being deemed medically stable for discharge.    Clinical Social Worker will await follow up from financial counseling about patient pending COBRA premiums.  CSW to follow up with  patient family to offer support and assist with discharge planning.  [address] [place], [place] [phone]

## 2011-10-01 NOTE — Progress Notes (Signed)
Utilization review completed. Korbin Mapps Diane4/09/2011  

## 2011-10-01 NOTE — Progress Notes (Signed)
Physical Therapy Treatment Patient Details Name: Samari Gorby MRN: [id number] DOB: Jun 22, 1966 Today's Date: 10/01/2011  PT Assessment/Plan  PT - Assessment/Plan Comments on Treatment Session: Pt transfered from bed to tilt table using a maxi-silde and total assist+5.  Patient slowly brought from supine to standing in tilt table.  Patient BP at 80 degrees was 133/87, initially in standing, 96 degrees, patient BP 121/104, halfway through standing pt BP 156/98, and after standing 15 minutes patient BP 152/98.  Pt tolerated standing in tilt table for 15 minutes.  Pt followed simple RUE commands for "thumbs up/down" and "squeeze".  Pt unable to follow commands to "wave", "smile", and "open mouth".  Pt demonstrated visual tracking to the right only.  Pt seen to cry when his cousin walked into the room.  Pt returned to supine and transfered back to the bed using the maxi-slide and total assist+5. PT Plan: Discharge plan needs to be updated;Frequency needs to be updated PT Frequency: Min 3X/week Recommendations for Other Services: Rehab consult Follow Up Recommendations: Inpatient Rehab;LTACH (Based on patient progression) Equipment Recommended: Defer to next venue PT Goals  Acute Rehab PT Goals PT Goal Formulation: Patient unable to participate in goal setting Time For Goal Achievement: 2 weeks Additional Goals Additional Goal #1: Pt will consistently demonstrate Rancho level III responses (localized response) and emerging Rancho level IV (confused, agitated). PT Goal: Additional Goal #1 - Progress: Progressing toward goal  PT Treatment Precautions/Restrictions  Precautions Precautions: Fall (PEG and Trach) Required Braces or Orthoses: Yes Cervical Brace: Hard collar Restrictions Weight Bearing Restrictions: No Mobility (including Balance) Bed Mobility Bed Mobility: No Transfers Transfers: No Ambulation/Gait Ambulation/Gait: No Stairs: No  Posture/Postural Control Posture/Postural  Control: No significant limitations Balance Balance Assessed: No Exercise    End of Session PT - End of Session Activity Tolerance: Patient limited by fatigue Patient left: in bed General Behavior During Session: Flat affect Cognition: Impaired  Ezzard Standing SPT 10/01/2011, 1:51 PM Jake Shark, PT DPT [phone]

## 2011-10-01 NOTE — Progress Notes (Signed)
Continues to make small improvements in neuro status. Working toward [employer]

## 2011-10-02 DIAGNOSIS — S069X9A Unspecified intracranial injury with loss of consciousness of unspecified duration, initial encounter: Secondary | ICD-10-CM

## 2011-10-02 DIAGNOSIS — W320XXA Accidental handgun discharge, initial encounter: Secondary | ICD-10-CM

## 2011-10-02 LAB — CBC
HCT: 31.3 % — ABNORMAL LOW (ref 39.0–52.0)
Hemoglobin: 9.9 g/dL — ABNORMAL LOW (ref 13.0–17.0)
MCHC: 31.6 g/dL (ref 30.0–36.0)
MCV: 86.7 fL (ref 78.0–100.0)
RDW: 13.8 % (ref 11.5–15.5)
WBC: 10.5 10*3/uL (ref 4.0–10.5)

## 2011-10-02 LAB — GLUCOSE, CAPILLARY
Glucose-Capillary: 121 mg/dL — ABNORMAL HIGH (ref 70–99)
Glucose-Capillary: 141 mg/dL — ABNORMAL HIGH (ref 70–99)
Glucose-Capillary: 144 mg/dL — ABNORMAL HIGH (ref 70–99)

## 2011-10-02 LAB — BASIC METABOLIC PANEL
BUN: 22 mg/dL (ref 6–23)
Chloride: 105 mEq/L (ref 96–112)
Creatinine, Ser: 0.65 mg/dL (ref 0.50–1.35)
GFR calc Af Amer: >90 mL/min (ref >90)
Glucose, Bld: 129 mg/dL — ABNORMAL HIGH (ref 70–99)
Potassium: 4.2 mEq/L (ref 3.5–5.1)
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
## 2011-10-02 NOTE — Progress Notes (Signed)
Physical Therapy Treatment Patient Details Name: Mark [name] MRN: [id number] DOB: 1966-01-21 Today's Date: 10/02/2011  PT Assessment/Plan  PT - Assessment/Plan Comments on Treatment Session: Pt tolerated sitting at EOB for 13 minutes with a BP of 145/84. Pt followed simple RUE commands for "thumbs up/down" and "squeeze". Pt unable to follow commands to "wave" and "smile".  Pt tolerated brushing his teeth, but required multiple commands and assistance to complete.  Pt demonstrated visual tracking to the right and left.  Pt continues to be Rancho III, with characteristics of IV seen by nursing, pt pulling on lines with nurse. PT Plan: Discharge plan remains appropriate;Frequency remains appropriate PT Frequency: Min 3X/week Recommendations for Other Services: Rehab consult Follow Up Recommendations: Inpatient Rehab;LTACH (Based on pt progression) Equipment Recommended: Defer to next venue PT Goals  Acute Rehab PT Goals PT Goal Formulation: Patient unable to participate in goal setting Time For Goal Achievement: 2 weeks Additional Goals Additional Goal #1: Pt will consistently demonstrate Rancho level III responses (localized response) and emerging Rancho level IV (confused, agitated). PT Goal: Additional Goal #1 - Progress: Progressing toward goal  PT Treatment Precautions/Restrictions  Precautions Precautions: Fall;Other (comment) (Cervical, PEG and Trach) Required Braces or Orthoses: Yes Cervical Brace: Hard collar Restrictions Weight Bearing Restrictions: No Mobility (including Balance) Bed Mobility Bed Mobility: Yes Rolling Left: 1: +2 Total assist;Patient percentage (comment) (pt = 0%) Rolling Left Details (indicate cue type and reason): Pt unable to assist with rolling. Left Sidelying to Sit: 1: +2 Total assist;Patient percentage (comment) (pt = 0%) Left Sidelying to Sit Details (indicate cue type and reason): Pt unable to assist with rolling Sitting - Scoot to Edge of Bed:  1: +2 Total assist;Patient percentage (comment) (pt = 0%) Sitting - Scoot to Edge of Bed Details (indicate cue type and reason): Pt unable to assist with weight shifting and scooting to EOB. Sit to Supine: 1: +2 Total assist;Patient percentage (comment) (pt = 0%) Sit to Supine - Details (indicate cue type and reason): Pt unable to assist with transfer from sitting to supine. Scooting to [facility]: 1: +2 Total assist;Patient percentage (comment) (pt = 0%) Scooting to [facility] Details (indicate cue type and reason): 4 person assist in order to scoot to [facility]. Transfers Transfers: No Ambulation/Gait Ambulation/Gait: No Stairs: No  Posture/Postural Control Posture/Postural Control: No significant limitations Balance Balance Assessed: Yes Static Sitting Balance Static Sitting - Balance Support: No upper extremity supported;Feet supported Static Sitting - Level of Assistance: 2: Max assist;Patient percentage (comment) (pt = 20%) Static Sitting - Comment/# of Minutes: Pt tolerated sitting on EOB for 13 minutes.  Pt activating trunk some to help maintain balance.  Pt activating extenors in order to pull away from PT during shoulder retraction. Exercise    End of Session PT - End of Session Activity Tolerance: Patient limited by fatigue Patient left: in bed;with call bell in reach General Behavior During Session: Flat affect Cognition: Impaired  Ezzard Standing SPT 10/02/2011, 2:30 PM  [place], PT DPT [phone]

## 2011-10-02 NOTE — Progress Notes (Signed)
Clinical Social Worker continuing to follow patient and patient family for emotional support and discharge planning needs.  CSW spoke with patient mother over the phone to discuss inpatient rehab consult per PA.  CSW reiterated the importance of the COBRA premiums to cover patient plans at discharge.  Patient mother understanding and states she will have the money by Tuesday for COBRA premiums.  CSW will follow up patient mother pending inpatient rehab decision about admission.  Clinical Social Worker will continue to be available for support and to facilitate patient discharge needs.  [address] [place], [place] [phone]

## 2011-10-02 NOTE — Progress Notes (Signed)
10/02/11 9:40  Call from [place] in admissions at [facility], she states [facility] requires they will have to confirm [facility] payments with [facility] before accepting patient for admission.  Morrie Sheldon with financial counseling notified, they hope to have a check by early next week.  Jim Like RN BSN CCM
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
## 2011-10-02 NOTE — Progress Notes (Signed)
Able to track with both eyes. Some mvmt in RUE  Mark [name] M. Andrey Campanile, MD, FACS General, Bariatric, & Minimally Invasive Surgery [facility], [place]

## 2011-10-03 LAB — GLUCOSE, CAPILLARY
Glucose-Capillary: 114 mg/dL — ABNORMAL HIGH (ref 70–99)
Glucose-Capillary: 126 mg/dL — ABNORMAL HIGH (ref 70–99)
Glucose-Capillary: 122 mg/dL — ABNORMAL HIGH (ref 70–99)
Glucose-Capillary: 114 mg/dL — ABNORMAL HIGH (ref 70–99)

## 2011-10-03 NOTE — Progress Notes (Signed)
10 Days Post-Op  Subjective: Continues to make progress comfortable  Objective: Vital signs in last 24 hours: Temp:  [98.3 F (36.8 C)-100 F (37.8 C)] 98.3 F (36.8 C) (04/06 0934) Pulse Rate:  [74-110] 97  (04/06 0934) Resp:  [18-26] 20  (04/06 0934) BP: (108-138)/(59-77) 117/73 mmHg (04/06 0934) SpO2:  [94 %-100 %] 100 % (04/06 0934) FiO2 (%):  [28 %] 28 % (04/06 0934) Last BM Date: 10/01/11  Intake/Output from previous day: 04/05 0701 - 04/06 0700 In: 1420 [NG/GT:400] Out: 675 [Urine:675] Intake/Output this shift:    Trach stable Lungs clear  Neuro unchanged  Lab Results:   [facility] 10/02/11 0937  WBC 10.5  HGB 9.9*  HCT 31.3*  PLT 361   BMET  Basename 10/02/11 0937  NA 141  K 4.2  CL 105  CO2 26  GLUCOSE 129*  BUN 22  CREATININE 0.65  CALCIUM 9.7   PT/INR No results found for this basename: LABPROT:2,INR:2 in the last 72 hours ABG No results found for this basename: PHART:2,PCO2:2,PO2:2,HCO3:2 in the last 72 hours  Studies/Results: No results found.  Anti-infectives: Anti-infectives     Start     Dose/Rate Route Frequency Ordered Stop   09/25/11 1000   amoxicillin-clavulanate (AUGMENTIN) 875-125 MG per tablet 1 tablet        1 tablet Per Tube Every 12 hours 09/25/11 0918 10/05/11 0959   09/17/11 1400   ceFAZolin (ANCEF) IVPB 1 g/50 mL premix        1 g 100 mL/hr over 30 Minutes Intravenous 3 times per day 09/17/11 0842 09/20/11 2132   09/17/11 0530   vancomycin (VANCOCIN) 1,500 mg in sodium chloride 0.9 % 500 mL IVPB  Status:  Discontinued        1,500 mg 250 mL/hr over 120 Minutes Intravenous Every 8 hours 09/16/11 2243 09/17/11 0842   09/15/11 1000   vancomycin (VANCOCIN) 1,250 mg in sodium chloride 0.9 % 250 mL IVPB  Status:  Discontinued        1,250 mg 166.7 mL/hr over 90 Minutes Intravenous Every 8 hours 09/15/11 0854 09/16/11 2243   09/11/11 0900   cefTRIAXone (ROCEPHIN) 1 g in dextrose 5 % 50 mL IVPB  Status:  Discontinued       1 g 100 mL/hr over 30 Minutes Intravenous Every 24 hours 09/11/11 0842 09/17/11 0842   09/11/11 0845   cefTRIAXone (ROCEPHIN) injection 1 g  Status:  Discontinued        1 g Intramuscular Every 24 hours 09/11/11 0834 09/11/11 0842          Assessment/Plan: s/p Procedure(s) (LRB): PERCUTANEOUS TRACHEOSTOMY (N/A) TBI  Continue PT/OT  LOS: 23 days    Tarry Blayney A 10/03/2011
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
## 2011-10-04 LAB — TRIGLYCERIDES: Triglycerides: 125 mg/dL (ref <150)

## 2011-10-04 LAB — GLUCOSE, CAPILLARY: Glucose-Capillary: 118 mg/dL — ABNORMAL HIGH (ref 70–99)
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
## 2011-10-04 NOTE — Progress Notes (Signed)
Pt 's urine is tea colored. Dr Andrey Campanile notified.

## 2011-10-04 NOTE — Progress Notes (Signed)
No change in status. Continue supportive efforts. No new recommendations

## 2011-10-05 LAB — CBC
HCT: 33.2 % — ABNORMAL LOW (ref 39.0–52.0)
MCH: 27.5 pg (ref 26.0–34.0)
MCV: 86.9 fL (ref 78.0–100.0)
RDW: 13.9 % (ref 11.5–15.5)
WBC: 7.9 10*3/uL (ref 4.0–10.5)

## 2011-10-05 LAB — BASIC METABOLIC PANEL
BUN: 18 mg/dL (ref 6–23)
CO2: 27 mEq/L (ref 19–32)
Calcium: 10 mg/dL (ref 8.4–10.5)
Chloride: 101 mEq/L (ref 96–112)
Creatinine, Ser: 0.58 mg/dL (ref 0.50–1.35)
Glucose, Bld: 130 mg/dL — ABNORMAL HIGH (ref 70–99)

## 2011-10-05 LAB — GLUCOSE, CAPILLARY: Glucose-Capillary: 114 mg/dL — ABNORMAL HIGH (ref 70–99)

## 2011-10-05 NOTE — Progress Notes (Signed)
Agree with student PT treatment note. Aniyiah Zell, PT DPT [phone]  
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
## 2011-10-05 NOTE — Progress Notes (Signed)
Clinical Social Worker spoke to patient mother at bedside to continue to discuss patient discharge plans.  Patient mother met with inpatient rehab today and remains hopeful that patient will be able to transfer directly to inpatient rehab from this hospitalization.  Patient mother understands that regardless of patient discharge disposition, the COBRA premiums must be covered.  Patient mother plans to bring a check for the appropriate amount to financial counseling tomorrow to cover 90 days of COBRA premiums on patient behalf.  CM and CSW will continue to follow for support and discharge planning needs.  CSW explained to patient mother that both inpatient rehab and [facility] would remain options for discharge at this time pending patient progress.  While engaging with patient mother, patient appropriately smiled and laughed, making intentional moves with his RUE.  CSW will continue to follow up with patient mother, financial counseling, inpatient rehab, and [facility] to facilitate patient discharge plans.  [address] [place], [place] [phone]

## 2011-10-05 NOTE — Progress Notes (Signed)
Nutrition Follow-up  Diet Order:  NPO  Pt is s/p PEG/trach placement on 3/27.  Current TF (Vital AF 1.2 @ 85 ml/hr) is providing: 2448 kcals, 153 grams protein, 1652 ml H2O with additional 800 ml free water. Total free water 2452 ml H2O Last bm 4/6 No family present at this time. Pt being evaluated for CIR vs LTAC   Meds: Scheduled Meds:   . amoxicillin-clavulanate  1 tablet Per Tube Q12H  . antiseptic oral rinse  15 mL Mouth Rinse QID  . artificial tears   Both Eyes Q8H  . chlorhexidine  15 mL Mouth Rinse BID  . clonazePAM  0.5 mg Per Tube QHS  . ferrous sulfate  300 mg Per Tube TID WC  . free water  200 mL Per Tube Q6H  . metoprolol tartrate  50 mg Per Tube BID  . pantoprazole sodium  40 mg Per Tube Q1200  . sodium chloride  10-40 mL Intracatheter Q12H  . DISCONTD: guaiFENesin  20 mL Per Tube Q6H   Continuous Infusions:   . feeding supplement (VITAL AF 1.2 CAL) 1,000 mL (10/04/11 2306)   PRN Meds:.acetaminophen (TYLENOL) oral liquid 160 mg/5 mL, HYDROcodone-acetaminophen, sodium chloride  Labs:  CMP     Component Value Date/Time   NA 138 10/05/2011 0518   K 4.1 10/05/2011 0518   CL 101 10/05/2011 0518   CO2 27 10/05/2011 0518   GLUCOSE 130* 10/05/2011 0518   BUN 18 10/05/2011 0518   CREATININE 0.58 10/05/2011 0518   CALCIUM 10.0 10/05/2011 0518   PROT 6.1 09/10/2011 1313   ALBUMIN 3.2* 09/10/2011 1313   AST 28 09/10/2011 1313   ALT 18 09/10/2011 1313   ALKPHOS 63 09/10/2011 1313   BILITOT 0.3 09/10/2011 1313   GFRNONAA >90 10/05/2011 0518   GFRAA >90 10/05/2011 0518   CBG (last 3)   Basename 10/05/11 0635 10/04/11 2111 10/04/11 0614  GLUCAP 114* 119* 129*    Intake/Output Summary (Last 24 hours) at 10/05/11 1237 Last data filed at 10/05/11 0754  Gross per 24 hour  Intake      0 ml  Output   1350 ml  Net  -1350 ml   Weight Status:   09/29/11: 259 lbs 09/28/11: 270 lbs 09/26/11: 265 lbs  Re-estimated needs:  2500 - 2700 kcal, 140-150 gm protein   Nutrition Dx:  Inadequate oral  intake (NI-2.1). Status: Ongoing  Goal:  Provide >/=90% of estimated needs, met  Intervention:    Continue current TF regimen  Monitor:  TF tolerance, weight   Kendell Bane Cornelison Pager #:  [phone]

## 2011-10-05 NOTE — Progress Notes (Signed)
Speech Pathology  PMSV (Passy-Muir Speaking Valve) Evaluation  I. HPI:   II. Tracheostomy Tube  Initial trach Placement date: 3/27  Trach collar period:  Type: shiley    Size: 8     FiO2: 28% Cuff: yes Fenestration:  no Secretion description: thick, white Frequency of tracheal suctioning: approx 2-3 per daily Level of secretion expectoration: expectorates from trach  IV. Cuff Deflation Trials  yes for 25 minutes  V. PMSV Trial PMSV was placed for 3 minutes. Able to redirect subglottic air through upper airway: yes Able to attain phonation with PMSV: no Able to expectorate secretions with PMSV: yes Breath Support for phonation: moderately decreased Intelligibiity: n/a RR: within defined limits SpO2: 100% HR:89 Behavior: cooperative  VI. Clinical Impression:  Pt. Tolerated cuff deflation with mild delayed coughing.  He wore thePMSV for a total of 3 minutes with intermittent coughing valve off trach hub.  Pt. Calm and cooperative with mom present for assessment.  Slight audible sound when pt. Exhaled forcefully, otherwise no additional vocalizations during session.  All vital signs were stable.  Pt. Communicating by nodding and shaking head.  Prognosis for use of valve is very good.  SLP left valve deflatted at end of session (per MD orders)  Therapy Diagnosis:   VII. Recommendations MD- Consider changing tracheostomy tube to a 6 cuffless 1. Patient to use PMSV with Speech Pathology only  2. 3.  VIII. Treatment Plan  yes Frequency/Duration: min x 3 Short Term Goals: 1.  Pt. Will wear PMV for 30 minutes with vital signs stable                                  2.  Pt. Will verbalize needs and thoughts using PMSV with moderate verbal cues  Pain: no  Royce Macadamia M.Ed [employer] [phone]  10/05/2011
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
## 2011-10-05 NOTE — Progress Notes (Signed)
Occupational Therapy Treatment Patient Details Name: Mark [name] MRN: [id number] DOB: December 13, 1965 Today's Date: 10/05/2011  OT Assessment/Plan OT Assessment/Plan Comments on Treatment Session: Pt demonstrating head nods this session for the first time in response to questioning appropriately. Response could not be determined true or false but response was appropriate for questioning.  OT Plan: Discharge plan needs to be updated OT Frequency: Min 3X/week Follow Up Recommendations: Inpatient Rehab Equipment Recommended: Defer to next venue OT Goals Acute Rehab OT Goals OT Goal Formulation: Patient unable to participate in goal setting Time For Goal Achievement: 2 weeks ADL Goals Pt Will Perform Grooming: with mod assist;Sitting, edge of bed;with cueing (comment type and amount) ADL Goal: Grooming - Progress: Progressing toward goals Miscellaneous OT Goals Miscellaneous OT Goal #1: Pt will follow 1 step simple command 2 out 4 attempts  OT Goal: Miscellaneous Goal #1 - Progress: Progressing toward goals Miscellaneous OT Goal #2: Pt will demostrate Rancho Coma Recovery III (localized response) 2 out 4 trials  Miscellaneous OT Goal #3: Pt will complete sit<>stand from bed level with bed elevated using lift equipment as appropriate total +2 pt 10% OT Goal: Miscellaneous Goal #3 - Progress: Goal set today  OT Treatment Precautions/Restrictions  Precautions Precautions: Fall;Other (comment) (peg trach cervical) Required Braces or Orthoses: Yes Cervical Brace: Hard collar Restrictions Weight Bearing Restrictions: No   ADL ADL Equipment Used: Other (comment) (sara plus) Ambulation Related to ADLs: Pt unable to AMbulate at this time ADL Comments: Pt supine<>sit EOB total +2 Pt =0% . pt positioned in sara plus with Lt UE straped to machine due to  no active movement noted. Pt attempted sit<>stand EOB x 4 with bed elevated total +4 with pad.pt cleared buttocks off bed ~ 3 inches. Pt then  returned to sitting EOB. Pt shaking head "NO" in response to questioning x2. Mother reports pt responding with head nods this weekend. Pt following simple commands with delayed response with Rt hand. Pt fatigued s/p sit<>stand Mobility  Bed Mobility Bed Mobility: Yes Rolling Left: 1: +2 Total assist;Patient percentage (comment) (pt = 0%) Rolling Left Details (indicate cue type and reason): PT required pad to facilitate bed mobility. Left Sidelying to Sit: 1: +2 Total assist;Patient percentage (comment) (pt = 0%) Left Sidelying to Sit Details (indicate cue type and reason): Pt unable to assist with rolling. Supine to Sit: 1: +2 Total assist;Patient percentage (comment);HOB elevated (Comment degrees) (pt0%) Sitting - Scoot to Edge of Bed: 1: +2 Total assist;Patient percentage (comment) (0%) Sitting - Scoot to Edge of Bed Details (indicate cue type and reason):   Pt unable to maintain upright sitting while weight shifting and scooting to EOB.  Sit to Supine:  (pt = 0%) Sit to Supine - Details (indicate cue type and reason): Pt unable to assist with transfer from sitting to supine. Scooting to [facility]: 1: +2 Total assist;Patient percentage (comment) (pt = 0%) Scooting to [facility] Details (indicate cue type and reason): Pt required 3-person assist in order to scoot to [facility]. Transfers Transfers: Yes Sit to Stand: Other (comment) (total +4 Pt 0% with sara plus) Sit to Stand Details (indicate cue type and reason): Attempted to transfer the patient from sit to stand using the sarah-plus.  However, the patient was not assist by activitaing his RLE or trunk in order to complete transfer.  Pt returned to sitting at EOB. Exercises General Exercises - Lower Extremity Ankle Circles/Pumps: PROM;Both;10 reps;Supine Hip ABduction/ADduction: PROM;Both;10 reps;Supine Hip Flexion/Marching: PROM;Both;10 reps;Supine  End of Session OT - End  of Session Activity Tolerance: Patient tolerated treatment well Patient left: in  bed;with call bell in reach;with family/visitor present;with restraints reapplied (mother; Rt hand mitten) Nurse Communication: Mobility status for transfers General Behavior During Session: Flat affect Cognition: Impaired Cognitive Impairment: Rancho Coma recovery level III (localized)  Mark [name]  10/05/2011, 12:58 PM Pager: [phone]

## 2011-10-05 NOTE — Progress Notes (Signed)
Dispo meeting tomorrow to ascertain if pt candidate for CIR vs LTAC. He has been improving, but may not be able to tolerate 2 hours therapy/day.

## 2011-10-05 NOTE — Progress Notes (Signed)
Rehab admissions - Evaluated for possible admission.  I spoke with mom.  I gave her rehab booklets.  I toured her on the rehab unit.  I answered many questions about inpatient rehab.  Patient is alert, moving mittened hand.  I would like to admit to inpatient rehab over the next few days as patient increases his tolerance and participation.  I will begin insurance pre-certification process.  I followed up with trauma case manager and PA today.  Pager [phone]

## 2011-10-05 NOTE — Progress Notes (Signed)
Suctioned patient. Thick yellow secretions present. Pt. Tolerated well.

## 2011-10-06 LAB — URINALYSIS, ROUTINE W REFLEX MICROSCOPIC
Nitrite: NEGATIVE
Specific Gravity, Urine: 1.026 (ref 1.005–1.030)
Urobilinogen, UA: 1 mg/dL (ref 0.0–1.0)

## 2011-10-06 LAB — GLUCOSE, CAPILLARY
Glucose-Capillary: 128 mg/dL — ABNORMAL HIGH (ref 70–99)
Glucose-Capillary: 135 mg/dL — ABNORMAL HIGH (ref 70–99)

## 2011-10-06 LAB — URINE MICROSCOPIC-ADD ON

## 2011-10-06 MED ORDER — BROMOCRIPTINE MESYLATE 2.5 MG PO TABS
2.5000 mg | ORAL_TABLET | Freq: Two times a day (BID) | ORAL | Status: DC
  Administered 2011-10-06 – 2011-10-08 (×5): 2.5 mg via ORAL
  Filled 2011-10-06 (×6): qty 1
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
## 2011-10-06 NOTE — Progress Notes (Signed)
Occupational Therapy Treatment Patient Details Name: Mark [name] MRN: [id number] DOB: February 25, 1966 Today's Date: 10/06/2011  OT Assessment/Plan OT Assessment/Plan Comments on Treatment Session: Pt demonstrating increased attention level to sustained , following simple commands, head nodding yes / no, moving Rt UE/ LE, activating truck on Rt side and tolerating PMV sitting at the EOB for ~30 minutes this session. Pt is progressing well and excellent rehab candidate. OT plans to attempt standing with a different piece of durable medical equipment that provides tactile input at the hips, accommodates pt's weight and height / different technique to get pt into standing.    OT Goals ADL Goals Pt Will Perform Grooming: with mod assist;Sitting, edge of bed;with cueing (comment type and amount) ADL Goal: Grooming - Progress: Progressing toward goals Miscellaneous OT Goals Miscellaneous OT Goal #1: Pt will follow 1 step simple command 2 out 4 attempts  OT Goal: Miscellaneous Goal #1 - Progress: Met Miscellaneous OT Goal #2: Pt will demostrate Rancho Coma Recovery III (localized response) 2 out 4 trials  OT Goal: Miscellaneous Goal #2 - Progress: Met Miscellaneous OT Goal #3: Pt will complete sit<>stand from bed level with bed elevated using lift equipment as appropriate total +2 pt 10% OT Goal: Miscellaneous Goal #3 - Progress: Progressing toward goals  OT Treatment Precautions/Restrictions  Precautions Precautions: Fall;Other (comment) (peg trach cervical)) Required Braces or Orthoses: Yes Cervical Brace: Hard collar Restrictions Weight Bearing Restrictions: No   ADL ADL Eating/Feeding: NPO Grooming: Performed;Wash/dry face;Minimal assistance (pt required v/c to initiate. ) Grooming Details (indicate cue type and reason): Pt reaching for cool cloth ~3 inches away and following cloth when moved .Pt scratching beard and chest throughout session. Pt sweating and room very warm this session. RN  Victorino Dike called maintenance to have room temperature adjusted. Pt knodding head yes and no throughout session. Pt following simple command with increased response time with 100% accuracy "okay sign, thumbs up, point to ceiling, show me three fingers) Pt demonstrating the Okay in response to questioning cue from therapist without direction. Pt showing this sign for the first time without cue to initiate this sign.  Where Assessed - Grooming: Sitting, bed;Supported ADL Comments: Pt sitting eob with min (A) for ~45 seconds this session. pt initiating with Rt side and demonstrating pushers syndrom with Rt UE. Pt extending Rt LE at EOB (kicking leg in and out) Pt demonstrates posterior lean during session due to patient trying to initiate Rt side for sitting balance. Pt sitting EOB majority of session mod (A). Pt following simple commands and tolerating PMV entire session. Pt provided aspen collar pad change and new gown due to profuse sweating. Pt responding with head nods 75% accuracy this session with questioning. Pt shown picture of dog and reaching for picture.  Mobility  Bed Mobility Bed Mobility: Yes Supine to Sit: 1: +2 Total assist;HOB elevated (Comment degrees) (Pt pushing posteriorly during transfer so 0%) Exercises    End of Session OT - End of Session Equipment Utilized During Treatment: Cervical collar Activity Tolerance: Patient tolerated treatment well Patient left: in bed;with call bell in reach (pt left in chair position RN Victorino Dike aware) Nurse Communication: Need for lift equipment General Behavior During Session: Flat affect (pt smiling several times at SLP this session) Cognition: Impaired Cognitive Impairment: Rancho coma recovery level III (localized)  Lucile Shutters  10/06/2011, 12:24 PM Pager: [phone]

## 2011-10-06 NOTE — Progress Notes (Signed)
Patient seen and examined.  Agree with PA's note.  Awaiting placement.

## 2011-10-06 NOTE — Progress Notes (Signed)
Speech Language Pathology Treatment  Patient Details Name: Mark [name] MRN: [id number] DOB: 1965-11-08 Today's Date: 10/06/2011  Objective:  Pt. Seen for cognitive therapy and use of Passy-Muir Speaking valve in conjunction with OT.  Assisted pt. In sitting edge of bed to engage in functional activities such as washing face, following commands and viewing family pictures.  [name] was awake and mostly alert throughout the 35 minute session and needed mild-moderate verbal cues to sustain attention to speaker.  Pt. followed 3 simple one step commands with supervision cues.  He wore his PMSV throughout session with Sp02 of 99%-100%, heart rate and respiratory rate were within defined limits.  Much less coughing and expectoration of secretions today with 1-2 instances of coughing valve off.  He vocalized once with a grunting sound.  No evidence of Co2 retention.  SLP was unsuccessful in eliciting additional vocalizations/verbalizations via singing, laughing, biographical questions (using picture of his dog).  Pt. responded to questions via head nods, shakes  Assessment:  Pt. Is tolerating the PMSV with improvement from yesterday.  Feel his cognitive deficits are limiting his ability to initiate/repeat verbalizations.  RN reports plans are to downsize trach to # 6 and cuffless which will also facilitate  vocalizations when he begins to initiate speech. SLP Goals  New Goals 10/06/11 1.  Pt. will vocalize on command in 3 out of 5 opportunities with mod verbal cues 2.  Pt. will verbalize basic wants and needs with mod verbal cues. 3.  Pt. will demonstrate appropriate problem solving abilities during functional situations with mod verbal cues    SLP Goal #1: Pt. will maintain alertness for 2 minutes with moderate verbal/tactile stimuli- goal met SLP Goal #2: Pt. will follow 3 simple, functional commands in ADL's with max verbal/tactile cues- goal met SLP Goal #3: Pt. will attempt to communicate via gestures,  mouthing words with max verbal cues- goal met for gestures    Royce Macadamia M.Ed [employer] [phone]  10/06/2011

## 2011-10-06 NOTE — Progress Notes (Signed)
Clinical Social Worker continuing to follow patient and patient family for support and discharge planning needs.  CSW spoke with patient mother over the phone who states she was present at bedside this morning during patient PT/Speech session and was pleased to see patient progress.  Per patient mother, plan is to down size patient trach today.    Clinical Social Worker spoke with financial counselor who states patient mother was able to provide a check for [employer] - forms and payment submitted in overnight mail.  Per inpatient rehab admissions coordinator, plan is for patient to transfer to TBI bed on Thursday October 08, 2011 - patient mother aware.    Clinical Social Worker will continue to be available for emotional support and discharge planning needs.  [address] [place], [place] [phone]

## 2011-10-07 LAB — GLUCOSE, CAPILLARY
Glucose-Capillary: 108 mg/dL — ABNORMAL HIGH (ref 70–99)
Glucose-Capillary: 130 mg/dL — ABNORMAL HIGH (ref 70–99)
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
## 2011-10-07 NOTE — Progress Notes (Signed)
UR completed 

## 2011-10-07 NOTE — Progress Notes (Addendum)
Speech Language Pathology Treatment  Patient Details Name: Mark [name] MRN: [id number] DOB: 1965-07-28 Today's Date: 10/07/2011  SLP Assessment/Plan/Recommendation:  Treatment focused on facilitation of communicative and cognitive abilities during last part of PT/OT session.  Pt. sat on edge of bed with OT/PT support with pt. Exhibiting increased trunk control.  Pt.'s trach downsized yesterday to # 6 Shiley cuffless.  SLP donned PMSV which remained in place for the duration of the session with SpO2 saturation remaining in upper 90's and adequate HR and RR.  Pt. was given max verbal and visual stimulation to elicit volitional or reflexive vocalizations which were not present during this session due to cognitve-linguistic deficits (poor initiation) resulting from TBI.  Responding to questions with head gestures.  Pt's mom reported pt. umbled a distorted "good morning" when mom said good morning upon her arrival this am.   Oral cavity cleaned prior to giving ice chip trials and applesauce.  Pt. masticated ice with mild-moderate delays with A-P propulsion with delayed swallow initiation with fairly immediate coughs after each trial.  Hand over hand assist with OT for feeding which pt. Initiated scooping applesauce on spoon as well as arm movement to mouth.  Laryngeal movement observed when attempting to swallow applesauce trials, however, he was unable to achieve a complete swallow.    Continue ST.  Educated pt.'s mom on current cognitive-communicative status, what to expect, and ways to facilitate communication/cognition.  RECOMMEND:  Pt. May wear speaking valve with full supervision while awake (communicated to RN)  SLP Goals   New Goals 10/06/11 1. Pt. will vocalize on command in 3 out of 5 opportunities with mod verbal cues 2. Pt. will verbalize basic wants and needs with mod verbal cues. 3. Pt. will demonstrate appropriate problem solving abilities during functional situations with mod verbal cues          Royce Macadamia M.Ed [employer] [phone]  10/07/2011

## 2011-10-07 NOTE — Progress Notes (Signed)
Trauma Service Note  Subjective: No new issues.  Objective: Vital signs in last 24 hours: Temp:  [98.1 F (36.7 C)-99.6 F (37.6 C)] 99.3 F (37.4 C) (04/10 0953) Pulse Rate:  [80-94] 85  (04/10 0953) Resp:  [18-24] 18  (04/10 0953) BP: (114-124)/(73-79) 114/73 mmHg (04/10 0953) SpO2:  [91 %-98 %] 91 % (04/10 0953) FiO2 (%):  [28 %] 28 % (04/10 0953) Last BM Date: 10/06/11  Intake/Output from previous day: 04/09 0701 - 04/10 0700 In: 400 [NG/GT:400] Out: 1475 [Urine:1475] Intake/Output this shift:    General: No acute distress.  No evidence of storming current.  Lungs: Clear to auscultation  Abd: Soft, tolerating tube feedings well.  Extremities: No changes.  Neuro: Followed command with right hand squeezing.  Lab Results: CBC   Basename 10/05/11 0518  WBC 7.9  HGB 10.5*  HCT 33.2*  PLT 275   BMET  Basename 10/05/11 0518  NA 138  K 4.1  CL 101  CO2 27  GLUCOSE 130*  BUN 18  CREATININE 0.58  CALCIUM 10.0   PT/INR No results found for this basename: LABPROT:2,INR:2 in the last 72 hours ABG No results found for this basename: PHART:2,PCO2:2,PO2:2,HCO3:2 in the last 72 hours  Studies/Results: No results found.  Anti-infectives: Anti-infectives     Start     Dose/Rate Route Frequency Ordered Stop   09/25/11 1000  amoxicillin-clavulanate (AUGMENTIN) 875-125 MG per tablet 1 tablet       1 tablet Per Tube Every 12 hours 09/25/11 0918 10/04/11 2213   09/17/11 1400   ceFAZolin (ANCEF) IVPB 1 g/50 mL premix        1 g 100 mL/hr over 30 Minutes Intravenous 3 times per day 09/17/11 0842 09/20/11 2132   09/17/11 0530   vancomycin (VANCOCIN) 1,500 mg in sodium chloride 0.9 % 500 mL IVPB  Status:  Discontinued        1,500 mg 250 mL/hr over 120 Minutes Intravenous Every 8 hours 09/16/11 2243 09/17/11 0842   09/15/11 1000   vancomycin (VANCOCIN) 1,250 mg in sodium chloride 0.9 % 250 mL IVPB  Status:  Discontinued        1,250 mg 166.7 mL/hr over 90  Minutes Intravenous Every 8 hours 09/15/11 0854 09/16/11 2243   09/11/11 0900   cefTRIAXone (ROCEPHIN) 1 g in dextrose 5 % 50 mL IVPB  Status:  Discontinued        1 g 100 mL/hr over 30 Minutes Intravenous Every 24 hours 09/11/11 0842 09/17/11 0842   09/11/11 0845   cefTRIAXone (ROCEPHIN) injection 1 g  Status:  Discontinued        1 g Intramuscular Every 24 hours 09/11/11 0834 09/11/11 0842          Assessment/Plan: s/p Procedure(s): PERCUTANEOUS TRACHEOSTOMY Continue foley due to patient inability to void on his own. Hypertriglyceridemia, may need to change TF formula if this persists May go to condom catheter  On Rehab Mom thinks patient could be depressed.  LOS: 27 days   Marta Lamas. Gae Bon, MD, FACS [phone] Trauma Surgeon 10/07/2011

## 2011-10-07 NOTE — Progress Notes (Signed)
Agree with student PT treatment note. Henryk Ursin, PT DPT [phone]  

## 2011-10-07 NOTE — Progress Notes (Signed)
Rehab admissions - I spoke with mom today.  Mom reports she and "Bjorn Loser" have the money to send to [employer] to cobra patient insurance for 6 months. Mom reports she met with [facility] yesterday.   I am planning to admit to inpatient rehab tomorrow, Thursday, if patient is medically ready for admission.  Mom is in agreement.  I answered additional questions for mom about rehab process.  Pager [phone]

## 2011-10-07 NOTE — Progress Notes (Signed)
Physical Therapy Treatment Patient Details Name: Mark [name] MRN: [id number] DOB: 1965/08/12 Today's Date: 10/07/2011  PT Assessment/Plan  PT - Assessment/Plan Comments on Treatment Session: Pt tolerated sitting at EOB for about 30 minutes.  Pt followed simple RUE commands for "thumbs up", pt moved to grab a picture of his dog with assistance, pt initiated grabbing a spoon and eating ice chips and apple sauce (demonstrated delayed swallow response).  Pt demonstrating extensor tone in both UEs and LEs.  Pt exhibited R ATNR while in sitting.  Pt responded to questions appropriately by either shaking or nodding his head with 75% consistency.  Pt demonstrated significantly increased RUE and trunk movement while sitting at EOB, patient exploring his environment and turning head and eyes to both sides.  Pt demosntrated activation of his trunk extensors when OT/PT initiated extention of his head.  Pt was activiting his LUE in order to assist with trunk support.  Pt tolerated PMSV while sitting edge of bed.  Pt continues to be Rancho 3 with emerging characteristics of a Rancho 4. PT Plan: Discharge plan remains appropriate;Frequency remains appropriate PT Frequency: Min 3X/week Recommendations for Other Services: Rehab consult Follow Up Recommendations: Inpatient Rehab Equipment Recommended: Defer to next venue PT Goals  Acute Rehab PT Goals PT Goal Formulation: Patient unable to participate in goal setting Time For Goal Achievement: 2 weeks Additional Goals Additional Goal #1: Pt will consistently demonstrate [place]o level III responses (localized response) and emerging Rancho level IV (confused, agitated). PT Goal: Additional Goal #1 - Progress: Progressing toward goal  PT Treatment Precautions/Restrictions  Precautions Precautions: Fall;Other (comment) (Peg, Trach, Cervical) Required Braces or Orthoses: Yes Cervical Brace: Hard collar Restrictions Weight Bearing Restrictions: No Mobility  (including Balance) Bed Mobility Bed Mobility: Yes Supine to Sit: 1: +2 Total assist;HOB elevated (Comment degrees) (Pt = 0%) Supine to Sit Details (indicate cue type and reason): Pt unable to assist with transfer, required assist to move LE to EOB and to lift shoulders into sitting. Sitting - Scoot to Edge of Bed: 1: +2 Total assist;Patient percentage (comment) (pt = 0%) Sitting - Scoot to Edge of Bed Details (indicate cue type and reason): Pt unable to assist with weight shifting and scooting to EOB.  Pt required max assist at trunk in order to maintain sitting during scooting. Sit to Supine: 1: +2 Total assist;Patient percentage (comment) (pt = 0%) Sit to Supine - Details (indicate cue type and reason): Pt unable to assist with return to bed. Scooting to [facility]: Other (comment) (+4 total assist, pt = 0%) Scooting to [facility] Details (indicate cue type and reason): Patient required 4 person assist in order to scoot to [facility]. Transfers Transfers: No  Posture/Postural Control Posture/Postural Control: No significant limitations Balance Balance Assessed: Yes Static Sitting Balance Static Sitting - Balance Support: Left upper extremity supported;Feet supported Static Sitting - Level of Assistance: 2: Max assist Static Sitting - Comment/# of Minutes: ~30 minutes.  Pt tolerated sitting at edge of bed with max assist.  Pt demonstrated instances of requiring mod assist.  Pt has a tendency to lean posteriorly while sitting. Exercise    End of Session PT - End of Session Activity Tolerance: Patient limited by fatigue Patient left: in bed;with call bell in reach;with family/visitor present General Behavior During Session: Flat affect Cognition: Impaired Cognitive Impairment: [place]o coma recovery level III (localized)  Ezzard Standing SPT 10/07/2011, 1:10 PM

## 2011-10-07 NOTE — Progress Notes (Signed)
Occupational Therapy Treatment Patient Details Name: Mark [name] MRN: [id number] DOB: [birth year] Today's Date: 10/07/2011  OT Assessment/Plan   OT Goals ADL Goals Pt Will Perform Grooming: with mod assist;Sitting, edge of bed;with cueing (comment type and amount) ADL Goal: Grooming - Progress: Progressing toward goals Miscellaneous OT Goals Miscellaneous OT Goal #3: Pt will complete sit<>stand from bed level with bed elevated using lift equipment as appropriate total +2 pt 10% OT Goal: Miscellaneous Goal #3 - Progress: Progressing toward goals  OT Treatment Precautions/Restrictions  Precautions Precautions: Fall;Other (comment) (peg trach) Required Braces or Orthoses: Yes Cervical Brace: Hard collar Restrictions Weight Bearing Restrictions: No   ADL ADL Eating/Feeding: Performed;Other (comment) (hand over hand to self feed apple sauce) Where Assessed - Eating/Feeding: Edge of bed (pt initiating task with multimodal cues) Grooming: Performed;Wash/dry face;Minimal assistance Where Assessed - Grooming: Sitting, bed;Supported ADL Comments: Pt sitting at EOB max (A) with moments of Mod (A) for ~30 seconds. Pt weight bearing through Lt UE and activating Lt UE. Pt more alert this session and scanning environemental with head turning. pt nodding head yes and no. Pt demonstrates ATNR with Rt UE PROM abduction shoulder. Pt attempting to adduct Rt UE with PROM. Pt provided ice chips and chewing on the ice. Pt provided apple sauce and initiating self feeding. pt with delayed swallow. Pt tolerated PMV at EOB. Pt demonstrated activation of the truck to self correct posture however pt with extensor tone .Rancho coma recovery level III (localized) Mobility  Bed Mobility Bed Mobility: Yes Supine to Sit: 1: +2 Total assist;HOB elevated (Comment degrees) Supine to Sit Details (indicate cue type and reason): pt =0% HOB 45 degrees Exercises    End of Session OT - End of Session Equipment  Utilized During Treatment: Cervical collar Activity Tolerance: Patient tolerated treatment well Patient left: in bed;with call bell in reach (mother present) Nurse Communication: Need for lift equipment General Behavior During Session:  (smiling several times this session) Cognition: Impaired Cognitive Impairment: Rancho coma recovery level III (localized)  Lucile Shutters  10/07/2011, 1:04 PM Pager: [phone]

## 2011-10-08 ENCOUNTER — Encounter (HOSPITAL_COMMUNITY): Payer: Self-pay | Admitting: Physician Assistant

## 2011-10-08 ENCOUNTER — Inpatient Hospital Stay (HOSPITAL_COMMUNITY)
Admission: RE | Admit: 2011-10-08 | Payer: BC Managed Care – PPO | Source: Ambulatory Visit | Admitting: Physical Medicine & Rehabilitation

## 2011-10-08 ENCOUNTER — Encounter (HOSPITAL_COMMUNITY): Payer: Self-pay | Admitting: *Deleted

## 2011-10-08 ENCOUNTER — Inpatient Hospital Stay (HOSPITAL_COMMUNITY)
Admission: RE | Admit: 2011-10-08 | Discharge: 2011-11-19 | DRG: 462 | Disposition: A | Discharge status: Skilled Nursing Facility | Payer: BC Managed Care – PPO | Source: Ambulatory Visit | Attending: Physical Medicine & Rehabilitation | Admitting: Physical Medicine & Rehabilitation

## 2011-10-08 DIAGNOSIS — X749XXA Intentional self-harm by unspecified firearm discharge, initial encounter: Secondary | ICD-10-CM | POA: Diagnosis present

## 2011-10-08 DIAGNOSIS — S0190XA Unspecified open wound of unspecified part of head, initial encounter: Secondary | ICD-10-CM | POA: Diagnosis present

## 2011-10-08 DIAGNOSIS — F3289 Other specified depressive episodes: Secondary | ICD-10-CM | POA: Diagnosis present

## 2011-10-08 DIAGNOSIS — D62 Acute posthemorrhagic anemia: Secondary | ICD-10-CM | POA: Diagnosis present

## 2011-10-08 DIAGNOSIS — R339 Retention of urine, unspecified: Secondary | ICD-10-CM | POA: Diagnosis present

## 2011-10-08 DIAGNOSIS — R1310 Dysphagia, unspecified: Secondary | ICD-10-CM | POA: Diagnosis present

## 2011-10-08 DIAGNOSIS — S069X9A Unspecified intracranial injury with loss of consciousness of unspecified duration, initial encounter: Secondary | ICD-10-CM

## 2011-10-08 DIAGNOSIS — M79609 Pain in unspecified limb: Secondary | ICD-10-CM | POA: Diagnosis present

## 2011-10-08 DIAGNOSIS — J9600 Acute respiratory failure, unspecified whether with hypoxia or hypercapnia: Secondary | ICD-10-CM | POA: Diagnosis present

## 2011-10-08 DIAGNOSIS — W320XXA Accidental handgun discharge, initial encounter: Secondary | ICD-10-CM

## 2011-10-08 DIAGNOSIS — G8190 Hemiplegia, unspecified affecting unspecified side: Secondary | ICD-10-CM | POA: Diagnosis present

## 2011-10-08 DIAGNOSIS — Z5189 Encounter for other specified aftercare: Secondary | ICD-10-CM

## 2011-10-08 DIAGNOSIS — J189 Pneumonia, unspecified organism: Secondary | ICD-10-CM | POA: Diagnosis not present

## 2011-10-08 DIAGNOSIS — F329 Major depressive disorder, single episode, unspecified: Secondary | ICD-10-CM | POA: Diagnosis present

## 2011-10-08 DIAGNOSIS — J15211 Pneumonia due to Methicillin susceptible Staphylococcus aureus: Secondary | ICD-10-CM | POA: Diagnosis not present

## 2011-10-08 DIAGNOSIS — E876 Hypokalemia: Secondary | ICD-10-CM | POA: Diagnosis present

## 2011-10-08 DIAGNOSIS — G4700 Insomnia, unspecified: Secondary | ICD-10-CM | POA: Diagnosis present

## 2011-10-08 DIAGNOSIS — R7309 Other abnormal glucose: Secondary | ICD-10-CM | POA: Diagnosis present

## 2011-10-08 DIAGNOSIS — S069X5A Unspecified intracranial injury with loss of consciousness greater than 24 hours with return to pre-existing conscious level, initial encounter: Secondary | ICD-10-CM

## 2011-10-08 LAB — GLUCOSE, CAPILLARY
Glucose-Capillary: 129 mg/dL — ABNORMAL HIGH (ref 70–99)
Glucose-Capillary: 145 mg/dL — ABNORMAL HIGH (ref 70–99)

## 2011-10-08 MED ORDER — JEVITY 1.2 CAL PO LIQD
1000.0000 mL | Freq: Every day | ORAL | Status: DC
  Administered 2011-10-08: 22:00:00
  Administered 2011-10-09: 250 mL
  Administered 2011-10-09: 200 mL
  Administered 2011-10-09: 350 mL
  Administered 2011-10-09: 05:00:00
  Administered 2011-10-09: 300 mL
  Administered 2011-10-10: 400 mL
  Administered 2011-10-10: 420 mL
  Administered 2011-10-10: 18:00:00
  Administered 2011-10-10 (×2): 420 mL
  Administered 2011-10-11: 11:00:00
  Administered 2011-10-11: 420 mL
  Administered 2011-10-11 (×2): 1000 mL
  Administered 2011-10-11: 420 mL
  Administered 2011-10-12: 1000 mL
  Administered 2011-10-12: 420 mL
  Filled 2011-10-08 (×30): qty 1000

## 2011-10-08 MED ORDER — TRAZODONE HCL 50 MG PO TABS: 25.0000 mg | ORAL_TABLET | Freq: Every evening | ORAL | Status: DC | PRN

## 2011-10-08 MED ORDER — TRAMADOL HCL 50 MG PO TABS
50.0000 mg | ORAL_TABLET | Freq: Four times a day (QID) | ORAL | Status: DC | PRN
  Administered 2011-10-14 – 2011-10-18 (×4): 50 mg via ORAL
  Filled 2011-10-08 (×5): qty 1

## 2011-10-08 MED ORDER — CHLORHEXIDINE GLUCONATE 0.12 % MT SOLN
15.0000 mL | Freq: Four times a day (QID) | OROMUCOSAL | Status: DC
  Administered 2011-10-08 – 2011-11-19 (×144): 15 mL via OROMUCOSAL
  Filled 2011-10-08 (×176): qty 15

## 2011-10-08 MED ORDER — OXYCODONE HCL 5 MG PO TABS
5.0000 mg | ORAL_TABLET | ORAL | Status: DC | PRN
  Administered 2011-10-10 – 2011-10-11 (×2): 10 mg via ORAL
  Administered 2011-10-13 – 2011-10-19 (×4): 5 mg via ORAL
  Filled 2011-10-08: qty 2
  Filled 2011-10-08 (×2): qty 1
  Filled 2011-10-08: qty 2
  Filled 2011-10-08 (×3): qty 1

## 2011-10-08 MED ORDER — DIPHENHYDRAMINE HCL 12.5 MG/5ML PO ELIX
12.5000 mg | ORAL_SOLUTION | Freq: Four times a day (QID) | ORAL | Status: DC | PRN
  Administered 2011-10-11: 12.5 mg via ORAL
  Filled 2011-10-08 (×2): qty 10

## 2011-10-08 MED ORDER — PANTOPRAZOLE SODIUM 40 MG PO PACK
40.0000 mg | PACK | Freq: Every day | ORAL | Status: DC
  Administered 2011-10-08 – 2011-10-18 (×11): 40 mg
  Filled 2011-10-08 (×13): qty 20

## 2011-10-08 MED ORDER — FERROUS SULFATE 300 (60 FE) MG/5ML PO SYRP
300.0000 mg | ORAL_SOLUTION | Freq: Two times a day (BID) | ORAL | Status: DC
  Administered 2011-10-08 – 2011-10-19 (×21): 300 mg
  Filled 2011-10-08 (×24): qty 5

## 2011-10-08 MED ORDER — PROCHLORPERAZINE MALEATE 5 MG PO TABS
5.0000 mg | ORAL_TABLET | Freq: Four times a day (QID) | ORAL | Status: DC | PRN
  Filled 2011-10-08: qty 2

## 2011-10-08 MED ORDER — PROCHLORPERAZINE 25 MG RE SUPP
12.5000 mg | Freq: Four times a day (QID) | RECTAL | Status: DC | PRN
  Filled 2011-10-08: qty 1

## 2011-10-08 MED ORDER — CLONAZEPAM 0.125 MG PO TBDP: 0.5000 mg | ORAL_TABLET | Freq: Every day | ORAL | Status: DC

## 2011-10-08 MED ORDER — GUAIFENESIN-DM 100-10 MG/5ML PO SYRP: 5.0000 mL | ORAL_SOLUTION | Freq: Four times a day (QID) | ORAL | Status: DC | PRN

## 2011-10-08 MED ORDER — BISACODYL 10 MG RE SUPP
10.0000 mg | Freq: Every day | RECTAL | Status: DC | PRN
  Administered 2011-10-09 – 2011-11-09 (×3): 10 mg via RECTAL
  Filled 2011-10-08 (×3): qty 1

## 2011-10-08 MED ORDER — ALUM & MAG HYDROXIDE-SIMETH 200-200-20 MG/5ML PO SUSP
30.0000 mL | ORAL | Status: DC | PRN
  Administered 2011-10-18: 30 mL via ORAL
  Filled 2011-10-08: qty 30

## 2011-10-08 MED ORDER — METOPROLOL TARTRATE 25 MG/10 ML ORAL SUSPENSION
50.0000 mg | Freq: Two times a day (BID) | ORAL | Status: DC
  Administered 2011-10-08 – 2011-10-19 (×22): 50 mg
  Filled 2011-10-08 (×27): qty 20

## 2011-10-08 MED ORDER — CLONAZEPAM 0.5 MG PO TABS
0.5000 mg | ORAL_TABLET | Freq: Every day | ORAL | Status: DC
  Administered 2011-10-08 – 2011-10-18 (×11): 0.5 mg
  Filled 2011-10-08 (×11): qty 1

## 2011-10-08 MED ORDER — ACETAMINOPHEN 325 MG PO TABS: 325.0000 mg | ORAL_TABLET | ORAL | Status: DC | PRN

## 2011-10-08 MED ORDER — METHYLPHENIDATE HCL 5 MG PO TABS
5.0000 mg | ORAL_TABLET | Freq: Two times a day (BID) | ORAL | Status: DC
Start: 2011-10-09 — End: 2011-10-12
  Administered 2011-10-09 – 2011-10-11 (×5): 5 mg
  Filled 2011-10-08 (×5): qty 1

## 2011-10-08 MED ORDER — POLYETHYLENE GLYCOL 3350 17 G PO PACK
17.0000 g | PACK | Freq: Every day | ORAL | Status: DC | PRN
  Administered 2011-10-09: 17 g via ORAL
  Filled 2011-10-08: qty 1

## 2011-10-08 MED ORDER — FREE WATER
200.0000 mL | Freq: Four times a day (QID) | Status: DC
  Administered 2011-10-08 – 2011-10-19 (×43): 200 mL

## 2011-10-08 MED ORDER — PROCHLORPERAZINE EDISYLATE 5 MG/ML IJ SOLN
5.0000 mg | Freq: Four times a day (QID) | INTRAMUSCULAR | Status: DC | PRN
  Filled 2011-10-08: qty 2

## 2011-10-08 MED ORDER — CITALOPRAM HYDROBROMIDE 10 MG/5ML PO SOLN
10.0000 mg | Freq: Every day | ORAL | Status: DC
  Administered 2011-10-08: 10 mg
  Filled 2011-10-08 (×4): qty 10

## 2011-10-08 MED ORDER — INSULIN ASPART 100 UNIT/ML ~~LOC~~ SOLN
0.0000 [IU] | SUBCUTANEOUS | Status: DC
  Administered 2011-10-08 – 2011-10-09 (×2): 1 [IU] via SUBCUTANEOUS
  Administered 2011-10-09: 2 [IU] via SUBCUTANEOUS
  Administered 2011-10-09 – 2011-10-13 (×16): 1 [IU] via SUBCUTANEOUS
  Administered 2011-10-14: 2 [IU] via SUBCUTANEOUS
  Administered 2011-10-14: 1 [IU] via SUBCUTANEOUS
  Administered 2011-10-14 (×2): 2 [IU] via SUBCUTANEOUS
  Administered 2011-10-14 – 2011-10-15 (×5): 1 [IU] via SUBCUTANEOUS
  Administered 2011-10-16 (×2): 2 [IU] via SUBCUTANEOUS
  Administered 2011-10-16 – 2011-10-18 (×4): 1 [IU] via SUBCUTANEOUS
  Administered 2011-10-18 (×2): 2 [IU] via SUBCUTANEOUS
  Administered 2011-10-18 – 2011-10-19 (×3): 1 [IU] via SUBCUTANEOUS
  Administered 2011-10-19: 2 [IU] via SUBCUTANEOUS
  Administered 2011-10-19: 1 [IU] via SUBCUTANEOUS
  Administered 2011-10-19: 2 [IU] via SUBCUTANEOUS
  Administered 2011-10-19 – 2011-10-21 (×6): 1 [IU] via SUBCUTANEOUS

## 2011-10-08 MED ORDER — FLEET ENEMA 7-19 GM/118ML RE ENEM
1.0000 | ENEMA | Freq: Once | RECTAL | Status: AC | PRN
  Filled 2011-10-08: qty 1

## 2011-10-08 NOTE — Progress Notes (Signed)
Patient ID: Mark [name], male   DOB: 09-May-1966, 46 y.o.   MRN: [id number]
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
## 2011-10-08 NOTE — Progress Notes (Signed)
Clinical Social Worker continuing to follow patient and patient family for support and discharge planning needs.  CSW spoke with patient mother over the phone to discuss patient plans to discharge to inpatient rehab today - patient mother aware and very excited for patient to begin next step in recovery process.  CSW explained that a new Child psychotherapist would follow patient once on rehab but if there were further questions I would be happy to assist.  Patient mother had questions regarding Disability application - CSW directed her to financial counseling for assistance.  Patient to transfer to inpatient rehab later today.  Clinical Social Worker will sign off for now as social work intervention is no longer needed. Please consult [place] again if new need arises.  [address] [place], [place] [phone]

## 2011-10-08 NOTE — Progress Notes (Signed)
It is okay for this patient to go to rehab.  This patient has been seen and I agree with the findings and treatment plan.  Marta Lamas. Gae Bon, MD, FACS [phone] (pager) [phone] (direct pager) Trauma Surgeon

## 2011-10-08 NOTE — Discharge Summary (Signed)
Okay to go to Rehab.  This patient has been seen and I agree with the findings and treatment plan.  Lylianna Fraiser O. Chauncey Sciulli, III, MD, FACS [phone] (pager) [phone] (direct pager) Trauma Surgeon  

## 2011-10-08 NOTE — Progress Notes (Signed)
Rehab admissions - I spoke with trauma PA and will plan to admit patient to inpatient rehab today.  Call me for questions.  Pager [phone]

## 2011-10-08 NOTE — Progress Notes (Signed)
Report received at 1530 and pt. admitted to 4001 @ 1600 via bed by Morrie Sheldon, RN.  Pt. Alert, non-verbal, VSS, family at bedside.  Family oriented to unit.
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
## 2011-10-08 NOTE — Progress Notes (Signed)
Pt D/C'd to rehab @ 1540 per MD order. Rema Fendt, RN

## 2011-10-08 NOTE — Progress Notes (Signed)
Speech Language Pathology Treatment  Patient Details Name: Mark [name] MRN: [id number] DOB: [birth year] Today's Date: 10/08/2011  SLP Assessment/Plan/Recommendation  Pt. Seen for speech treatment with PMSV, cognition and po trials.  [name] was alert, in bed, no family present.  SLP provided oral care before donning valve.  SpO2 100%, HR and RR WNL's. Pt. Sustained attention to speaker and task for approximately 30 seconds.  Pt. Unable to initiate vocal or verbalizations given max verbal and visual cues.  Responded to basic yes/no questions via mostly head nods. Initiated brushing his hair when handed his comb. Much stronger cough noted today.  SLP provided mild hand over hand assist to scoop applesauce to oral cavity.  Cough noted x 3 after bites puree possibly due to reduced pharyngeal strength and ROM.  Pt. Transferring to CIR today.  Continue cognitive therapy and po trials for appropriateness of objective eval or initiation of po's.  Pt. Exhibiting behaviors of Rancho III (localized response).  SLP Goals   New Goals 10/06/11  1. Pt. will vocalize on command in 3 out of 5 opportunities with mod verbal cues  2. Pt. will verbalize basic wants and needs with mod verbal cues.  3. Pt. will demonstrate appropriate problem solving abilities during functional situations with mod verbal cues       Mark [name] M.Ed [employer] [phone]  10/08/2011

## 2011-10-09 DIAGNOSIS — W320XXA Accidental handgun discharge, initial encounter: Secondary | ICD-10-CM

## 2011-10-09 DIAGNOSIS — S069X9A Unspecified intracranial injury with loss of consciousness of unspecified duration, initial encounter: Secondary | ICD-10-CM

## 2011-10-09 DIAGNOSIS — Z5189 Encounter for other specified aftercare: Secondary | ICD-10-CM

## 2011-10-09 LAB — COMPREHENSIVE METABOLIC PANEL
AST: 16 U/L (ref 0–37)
Albumin: 2.9 g/dL — ABNORMAL LOW (ref 3.5–5.2)
Calcium: 10.1 mg/dL (ref 8.4–10.5)
Creatinine, Ser: 0.69 mg/dL (ref 0.50–1.35)

## 2011-10-09 LAB — URINALYSIS, ROUTINE W REFLEX MICROSCOPIC
Glucose, UA: NEGATIVE mg/dL
Ketones, ur: NEGATIVE mg/dL
Protein, ur: NEGATIVE mg/dL

## 2011-10-09 LAB — URINE MICROSCOPIC-ADD ON

## 2011-10-09 LAB — GLUCOSE, CAPILLARY
Glucose-Capillary: 121 mg/dL — ABNORMAL HIGH (ref 70–99)
Glucose-Capillary: 123 mg/dL — ABNORMAL HIGH (ref 70–99)
Glucose-Capillary: 154 mg/dL — ABNORMAL HIGH (ref 70–99)
Glucose-Capillary: 113 mg/dL — ABNORMAL HIGH (ref 70–99)

## 2011-10-09 LAB — CBC
HCT: 34.7 % — ABNORMAL LOW (ref 39.0–52.0)
Hemoglobin: 10.9 g/dL — ABNORMAL LOW (ref 13.0–17.0)
MCH: 27.5 pg (ref 26.0–34.0)
MCV: 87.6 fL (ref 78.0–100.0)
RBC: 3.96 MIL/uL — ABNORMAL LOW (ref 4.22–5.81)

## 2011-10-09 LAB — DIFFERENTIAL
Eosinophils Absolute: 0.2 10*3/uL (ref 0.0–0.7)
Eosinophils Relative: 2 % (ref 0–5)
Lymphs Abs: 2.8 10*3/uL (ref 0.7–4.0)
Monocytes Relative: 5 % (ref 3–12)

## 2011-10-09 LAB — HEMOGLOBIN A1C: Mean Plasma Glucose: 128 mg/dL — ABNORMAL HIGH (ref <117)

## 2011-10-09 MED ORDER — CITALOPRAM HYDROBROMIDE 10 MG/5ML PO SOLN
20.0000 mg | Freq: Every day | ORAL | Status: DC
  Administered 2011-10-09 – 2011-10-11 (×3): 20 mg
  Administered 2011-10-12: 2 mg
  Administered 2011-10-13 – 2011-10-18 (×6): 20 mg
  Filled 2011-10-09 (×12): qty 10

## 2011-10-09 MED ORDER — PRO-STAT SUGAR FREE PO LIQD
30.0000 mL | Freq: Two times a day (BID) | ORAL | Status: DC
  Administered 2011-10-09 – 2011-10-19 (×20): 30 mL
  Filled 2011-10-09 (×22): qty 30
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
## 2011-10-09 NOTE — Progress Notes (Signed)
[facility] Individual Statement of Services  Patient Name:  Mark [name]  Date:  10/09/2011  Welcome to the [facility].  Our goal is to provide you with an individualized program based on your diagnosis and situation, designed to meet your specific needs.  With this comprehensive rehabilitation program, you will be expected to participate in at least 3 hours of rehabilitation therapies Monday-Friday, with modified therapy programming on the weekends.  Your rehabilitation program will include the following services:  Physical Therapy (PT), Occupational Therapy (OT), Speech Therapy (ST), 24 hour per day rehabilitation nursing, Therapeutic Recreaction (TR), Case Management (RN and Child psychotherapist), Rehabilitation Medicine, Nutrition Services and Pharmacy Services  Weekly team conferences will be held on Tuesdays to discuss your progress.  Your RN Case Designer, television/film set will talk with you frequently to get your input and to update you on team discussions.  Team conferences with you and your family in attendance may also be held.  Expected length of stay: ~ 6 weeks   Overall anticipated outcome: Minimum assistance  Depending on your progress and recovery, your program may change.  Your RN Case Estate agent will coordinate services and will keep you informed of any changes.  Your RN Sports coach and SW names and contact numbers are listed  below.  The following services may also be recommended but are not provided by the [facility]:   Driving Evaluations  Home Health Rehabiltiation Services  Outpatient Rehabilitatation [facility]  Vocational Rehabilitation   Arrangements will be made to provide these services after discharge if needed.  Arrangements include referral to agencies that provide these services.  Your insurance has been verified to be:  COBRA - [employer] of [place] Your primary doctor is: _________________    Pertinent information will be shared with your doctor and your insurance company.  Case Manager: Melanee Spry, [facility] [phone]  Social Worker:  [place], [place] [phone]  Information discussed with and copy given to patient by: Meryl Dare, 10/09/2011
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
## 2011-10-09 NOTE — Plan of Care (Signed)
Problem: RH BLADDER ELIMINATION Goal: RH STG MANAGE BLADDER WITH ASSISTANCE STG Manage Bladder With Mod. Assistance  Outcome: Not Applicable Date Met:  10/09/11 Patient continues to have foley in place at this time.

## 2011-10-09 NOTE — Progress Notes (Signed)
Patient information reviewed and entered into [facility] system by Jule Whitsel, RN, CRRN, PPS Coordinator.  Information including medical coding and functional independence measure will be reviewed and updated through discharge.    

## 2011-10-09 NOTE — Plan of Care (Signed)
Problem: RH BOWEL ELIMINATION Goal: RH STG MANAGE BOWEL W/MEDICATION W/ASSISTANCE STG Manage Bowel with Medication with Assistance.  Outcome: Not Applicable Date Met:  10/09/11 Patient unable to monitor medication needs at this time. Reviewed medications with patient's mother. Goal: RH STG MANAGE BOWEL W/EQUIPMENT W/ASSISTANCE STG Manage Bowel With Equipment With Assistance  Outcome: Not Applicable Date Met:  10/09/11 Patient unable to verbalize need for bowel management at this time required toileting after tube feeds.
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
## 2011-10-09 NOTE — Progress Notes (Signed)
Patient alert, unable to communicate with trach in place.  Patient noted mildly agitated the first few hours, rested well since 12 am.  Family at bedside.  Continuous pulse ox between 97-100% on 28% Oxygen, throughout the night.  Trach care done this morning.  Family refuse oral care at this time, d/t patient sleeping.  Will continue with plan of care. Mark [name]
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
## 2011-10-10 DIAGNOSIS — S069X9A Unspecified intracranial injury with loss of consciousness of unspecified duration, initial encounter: Secondary | ICD-10-CM

## 2011-10-10 DIAGNOSIS — W320XXA Accidental handgun discharge, initial encounter: Secondary | ICD-10-CM

## 2011-10-10 DIAGNOSIS — Z5189 Encounter for other specified aftercare: Secondary | ICD-10-CM

## 2011-10-10 LAB — GLUCOSE, CAPILLARY
Glucose-Capillary: 115 mg/dL — ABNORMAL HIGH (ref 70–99)
Glucose-Capillary: 133 mg/dL — ABNORMAL HIGH (ref 70–99)
Glucose-Capillary: 141 mg/dL — ABNORMAL HIGH (ref 70–99)
Glucose-Capillary: 142 mg/dL — ABNORMAL HIGH (ref 70–99)

## 2011-10-10 LAB — URINE CULTURE
Colony Count: NO GROWTH
Culture: NO GROWTH

## 2011-10-10 NOTE — Progress Notes (Signed)
Occupational Therapy Session Note  Patient Details  Name: Mark [name] MRN: [id number] Date of Birth: 06-19-1966  Today's Date: 10/10/2011 Time: 1000-1100 Time Calculation (min): 60 min  Short Term Goals: Week 1:  OT Short Term Goal 1 (Week 1): Pt will tolerate sitting EOB for 15 min with mod A in prep for functional activity OT Short Term Goal 2 (Week 1): Pt will demonstrated focused attention to take familar grooming item from therapist in prep for performing tasks OT Short Term Goal 3 (Week 1): Pt will roll to left in bed for clothing management/hygiene with total A of one person. OT Short Term Goal 4 (Week 1): Pt will answer yes/ no questions with 80% accuracy for familar ADL/ orientation tasks  Skilled Therapeutic Interventions/Progress Updates:    [email or web address].wilson-patterson.[email or web address] back from Physical Therapy.  Addressed grooming at sink, using LUE/RUE to brush teeth.  Needed max assist to brush plus pt bites on swap.  Pt. laughed during session appropriately.  He demonstrated AROM in RUE elbow, hand.  Weakness noted in shoulder.  Needed max assist to use LUE.  Pt. Sacral sitting in wc and used maxi sky to get him back to bed secondary BM.  Therapy Documentation Precautions:  Precautions Precautions: Fall Required Braces or Orthoses: Cervical Brace Cervical Brace: Hard collar;Applied in supine position Restrictions Weight Bearing Restrictions: No General:   Pain:  None      Therapy/Group: Individual Therapy  Humberto Seals 10/10/2011, 4:31 PM

## 2011-10-10 NOTE — Progress Notes (Signed)
Physical Therapy Note  Patient Details  Name: Mark [name] MRN: [id number] Date of Birth: 03/01/1966 Today's Date: 10/10/2011  1400-1430  (30 minutes) Pain: nonverbal, no acute distress noted, lethargic Focus of treatment: Passive stretches bilateral LEs Treatment: Passive stretches bilaterally- heel cords, hamstrings, hip adductors , hip rotators;passive hip/knee flexion bilaterally focusing on RT knee/hip secondary to extensor tone   Mark [name],Mark [name] 10/10/2011, 2:27 PM

## 2011-10-10 NOTE — Progress Notes (Signed)
Physical Therapy Note  Patient Details  Name: Parry Po MRN: [id number] Date of Birth: December 25, 1965 Today's Date: 10/10/2011  0900-0955 (55 minutes) individual\ Pain: non-verbal/ no acute distress noted   Oxygen trach collar @   6L  (30%)  Focus of treatment: Bed mobilityTreatment: Rolling total assist +2 ; +2 assist for hygiene, pt incontinent of tar colored stool; passive stretch bilateral LEs ; pt has max extensor tone LT quads; Maxi-Sky transfer +2; up in reclining wc;  Danett Palazzo,JIM 10/10/2011, 9:50 AM

## 2011-10-10 NOTE — Progress Notes (Signed)
Pt cleaned of small pasty stool and repositioned to l side.Checked pt shortly after and pt had his r  Arm up and moving it about freelywbb

## 2011-10-10 NOTE — Progress Notes (Signed)
Pt cleaned after incont. Of a  moderate ,very soft ,pasty black stool. Pt repositioned onto his r side.wbb

## 2011-10-10 NOTE — Progress Notes (Signed)
Complete bath given.Then pt had bm.cleaned.Trach care completed w/ skin intact and thick secretions.tolerated well

## 2011-10-10 NOTE — Progress Notes (Signed)
Speech Language Pathology Daily Session Note  Patient Details  Name: Mark [name] MRN: [id number] Date of Birth: 05-02-1966  Today's Date: 10/10/2011 Time: 0802-0830 Time Calculation (min): 28 min  Short Term Goals: Week 1: SLP Short Term Goal 1 (Week 1): Pt will demonstrate focused attention to a functional and familiar task for ~60 secs with Max A verbal and tactile cues. SLP Short Term Goal 2 (Week 1): Pt will track to left space during functional and familair tasks with Max A verbal and visual cues. SLP Short Term Goal 3 (Week 1): Pt will answer yes/no questions with multimodal communication with ~75% accuracy. SLP Short Term Goal 4 (Week 1): Pt will tolerate PMSV for ~60 minutes without signs of distress with supervision.  SLP Short Term Goal 5 (Week 1): pt will initiate trials during self-feeding task with Max A verbal and tactile cues.   Skilled Therapeutic Interventions: Session focused on treatment with PMSV in place; Patient with yellowish mucous around trach with positive cough; SLP performed suctioning around trach site, which reduced cough and placed PMSV.  Patient maintained SpO2 98-100% and HR: 88 with humidified air via trach collar. SLP attempted oral care with max assist semantic, visual and tactile cues; however, patient refused by resisting.  SLP attempted yes/no questions with max assist semantic and visual cues with familiar family member pictures and accurate yes responses in 50% of opportunities and 0 no responses.  SLP also educated RN regarding recommendations for PMSV placement when awake with full staff supervision.   Daily Session Precautions/Restrictions    FIM:  Comprehension Comprehension Mode: Auditory Comprehension: 1-Understands basic less than 25% of the time/requires cueing 75% of the time Expression Expression Mode: Verbal Expression: 1-Expresses basis less than 25% of the time/requires cueing greater than 75% of the time. Social Interaction Social  Interaction: 1-Interacts appropriately less than 25% of the time. May be withdrawn or combative. Problem Solving Problem Solving: 1-Solves basic less than 25% of the time - needs direction nearly all the time or does not effectively solve problems and may need a restraint for safety Memory Memory: 1-Recognizes or recalls less than 25% of the time/requires cueing greater than 75% of the time General    Pain Pain Assessment Pain Assessment: Faces Faces Pain Scale: No hurt  Therapy/Group: Individual Therapy  Charlane Ferretti., CCC-SLP [phone]  [name] 10/10/2011, 8:45 AM
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
## 2011-10-11 LAB — GLUCOSE, CAPILLARY
Glucose-Capillary: 106 mg/dL — ABNORMAL HIGH (ref 70–99)
Glucose-Capillary: 139 mg/dL — ABNORMAL HIGH (ref 70–99)
Glucose-Capillary: 146 mg/dL — ABNORMAL HIGH (ref 70–99)

## 2011-10-11 LAB — URINALYSIS, ROUTINE W REFLEX MICROSCOPIC
Nitrite: NEGATIVE
Protein, ur: 30 mg/dL — AB
Specific Gravity, Urine: 1.019 (ref 1.005–1.030)
Urobilinogen, UA: 1 mg/dL (ref 0.0–1.0)

## 2011-10-11 LAB — CBC
MCHC: 32.1 g/dL (ref 30.0–36.0)
Platelets: 301 10*3/uL (ref 150–400)
RDW: 14.2 % (ref 11.5–15.5)
WBC: 7.7 10*3/uL (ref 4.0–10.5)

## 2011-10-11 LAB — URINE MICROSCOPIC-ADD ON

## 2011-10-11 NOTE — Progress Notes (Signed)
Physical Therapy Note  Patient Details  Name: Mark [name] MRN: [id number] Date of Birth: 21-Feb-1966 Today's Date: 10/11/2011  0945-1030 (45 minutes) individual Pain : nonverbal , no acute distress noted  Oxygen sats resting 100% 6L (30%) Focus of treatment: improve tolerance to sitting in wc Treatment PROM bilateral LEs in supine,bilateral heel cord stretches, extensor tone continues RT LE : Rolling total assist +2; transfer Maxi-Sky +2 assist; pt secured in wc (BP sitting 124/84 pulse 74  Oxygen sats 100%); pt displayed increased grip on right.  Adrianna Dudas,JIM 10/11/2011, 11:59 AM

## 2011-10-11 NOTE — Progress Notes (Signed)
Noted patient scratching chest area. Benadryl 12.5 this helped . Doctor notified of urine looking  [facility] reported blood clots in tube. No order given.
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
## 2011-10-11 NOTE — Progress Notes (Signed)
Doctor notified of increase in blood in foley tube and bag. No odor, cloudy. ua c/s sent at 1850. Cbc drawn and sent.. Patient not on Lovenox, green glove applied to rt hand to keep him from scratching . Foley tube secured to leg. Temp 99.2 at 4pm . Down to 98.1  At 5:45 pm.

## 2011-10-12 LAB — GLUCOSE, CAPILLARY
Glucose-Capillary: 150 mg/dL — ABNORMAL HIGH (ref 70–99)
Glucose-Capillary: 146 mg/dL — ABNORMAL HIGH (ref 70–99)

## 2011-10-12 MED ORDER — METHYLPHENIDATE HCL 5 MG PO TABS
10.0000 mg | ORAL_TABLET | Freq: Two times a day (BID) | ORAL | Status: DC
  Administered 2011-10-12 – 2011-10-13 (×4): 10 mg
  Filled 2011-10-12 (×4): qty 2

## 2011-10-12 MED ORDER — JEVITY 1.2 CAL PO LIQD
420.0000 mL | Freq: Every day | ORAL | Status: DC
  Administered 2011-10-12 – 2011-10-16 (×20): 420 mL
  Administered 2011-10-16: 237 mL
  Administered 2011-10-16 – 2011-10-17 (×4): 420 mL
  Administered 2011-10-18: 15:00:00
  Administered 2011-10-18: 420 mL
  Administered 2011-10-18 (×2)
  Administered 2011-10-19: 420 mL
  Filled 2011-10-12 (×44): qty 474

## 2011-10-12 MED ORDER — HYDROCORTISONE 1 % EX CREA
TOPICAL_CREAM | Freq: Four times a day (QID) | CUTANEOUS | Status: DC | PRN
  Administered 2011-10-12 – 2011-10-13 (×2): via TOPICAL
  Filled 2011-10-12: qty 28

## 2011-10-12 NOTE — Progress Notes (Addendum)
Occupational Therapy Session Note  Patient Details  Name: Mark [name] MRN: [id number] Date of Birth: [birth year]  Today's Date: 10/12/2011 Time: 0830-0900 Time Calculation (min): 30 min  Short Term Goals: Week 1:  OT Short Term Goal 1 (Week 1): Pt will tolerate sitting EOB for 15 min with mod A in prep for functional activity OT Short Term Goal 2 (Week 1): Pt will demonstrated focused attention to take familar grooming item from therapist in prep for performing tasks OT Short Term Goal 3 (Week 1): Pt will roll to left in bed for clothing management/hygiene with total A of one person. OT Short Term Goal 4 (Week 1): Pt will answer yes/ no questions with 80% accuracy for familar ADL/ orientation tasks  Skilled Therapeutic Interventions/Progress Updates:    Co treat with PT. Self care retraining. Focus on visual attention to stimuli, focused attention to familiar basic commands, bed mobility rolling right and left with use of bed rail rolling to the left for hygiene after incontinent BM and for LB clothing management, supine to sit with total A +2 using bed pad for positioning of hips. At EOB pt demonstrated increased trunk control and postural adjustments required min to mod A with tactile mod cues. Increased head control with functional task of donning shift. Pt also demonstrated increased initiation and release with grasp. Transferred with sliding board total A +2 with pt initiating a forward weight shift. With functional activity of throwing and catching ball- with focus to grasp and release with counting as a verbal cue and min to mod A for bilateral UE for reflexes.  Second session  11:00-12:00 Therapeutic activity and cognitive retraining. Used standing frame to promote sit to stand, forward weight shift, standing tolerance, X3 etc. Once standing, focus on trunk control with support through [name] outstretched and mod A, tactile cues for postural adjustments, head control. Able to achieve  knee flexion about 80 degrees by end of session. Visual attention to familiar family pictures and sports section of newspaper. Transferred back to bed with slide board Total A +2 with focus on pt's ability to weight shift forward and maintain forward posture to A with transfer. Bed mobility for hygiene after incontinent BM with max A +2. Pt very diaphoretic by end of session.     Therapy Documentation Precautions: use for  Precautions Precautions: Fall Required Braces or Orthoses: Cervical Brace Cervical Brace: Hard collar;Applied in supine position Restrictions Weight Bearing Restrictions: No Pain:  no c/o or grimaces for pain  See FIM for current functional status  Therapy/Group: Co-Treatment  Adan Sis 10/12/2011, 12:05 PM
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
## 2011-10-12 NOTE — Progress Notes (Signed)
Physical Therapy Note  Patient Details  Name: Mark [name] MRN: [id number] Date of Birth: 22-Mar-1966 Today's Date: 10/12/2011  Time: 0830-0930 60 minutes Co Treat with OT  No demonstration of pain.  Focus of treatment on active patient participation and encouraging initiation of purposeful task.  Patient incontinent of bowel; performed bed mobility for hygiene with +2 assist.  Patient able to initiate rolling to L with cuing for placement of R UE on rails and +2 total A; did not initiate rolling to R.  Patient able to initiate some hip flexion in R LE with increased time and cuing for LE dressing, but has rigid extension tone in the R knee preventing >15 degrees knee flexion.  +2 Total A for supine>sit transfer.  Min-max A for static sitting balance at EOB; patient able to maintain midline with min A but unable to initiate self-correction of lateral lean, especially to L.  Patient able to correct and bring trunk forward and to R with tactile, verbal cuing; increase trunk extension with manual facilitation.  Sliding board transfer to w/c with +2 total A, R LE in extensor tone.  Attempted throwing/catching activity with hand-over-hand assist on L for catching.  Patient had difficulty initiating throwing, especially with fatigue, despite hand over hand assist and counting.  Decreased attention <5 sec, slow to initiate movements, patient easily distracted by movement around him.  Patient demonstrated some signs of humor with appropriate smiling/laughing, and nod/shake his head in answer to simple yes/no questions with occasional perseveration of responses noted.  Individual therapy   Lockie Pares 10/12/2011, 11:54 AM

## 2011-10-12 NOTE — Progress Notes (Signed)
Orthopedic Tech Progress Note Patient Details:  Mark [name] 1965-09-03 [id number]  Other Ortho Devices Ortho Device Location: prafo boot  Ortho Device Interventions: Application   Cammer, Mickie Bail 10/12/2011, 11:32 AM

## 2011-10-12 NOTE — Progress Notes (Signed)
Recreational Therapy Assessment and Plan  Patient Details  Name: Mark [name] MRN: [id number] Date of Birth: February 18, 1966 Today's Date: 10/12/2011  Order received and chart reviewed.  Pt placed on HOLD for TR services at this time, will continue to monitor through team for future participation.  Iliany Losier 10/12/2011, 8:11 AM

## 2011-10-12 NOTE — Progress Notes (Signed)
Speech Language Pathology Daily Session Note  Patient Details  Name: Mark [name] MRN: [id number] Date of Birth: 1965-09-01  Today's Date: 10/12/2011 Time: 1330-1400 Time Calculation (min): 30 min  Short Term Goals: SLP Short Term Goal 1 (Week 1): Pt will demonstrate focused attention to a functional and familiar task for ~60 secs with Max A verbal and tactile cues. SLP Short Term Goal 2 (Week 1): Pt will track to left space during functional and familair tasks with Max A verbal and visual cues. SLP Short Term Goal 3 (Week 1): Pt will answer yes/no questions with multimodal communication with ~75% accuracy. SLP Short Term Goal 4 (Week 1): Pt will tolerate PMSV for ~60 minutes without signs of distress with supervision.  SLP Short Term Goal 5 (Week 1): pt will initiate trials during self-feeding task with Max A verbal and tactile cues.   Skilled Therapeutic Interventions: Co-tx with PT to focus on positioning during PO trials with focus on initiation, sustained attention, purposeful behavior, and yes/no questions. PMSV donned and oral care performed with total A. Pt resisting hand over hand with toothbrush but did allow clinician to complete. Pt consumed trials of ice chips, water and puree. Pt had overt cough X 1 due to large sip. Pt required Max A verbal and visual cues to initiate bites of puree textures and total A with hand over hand assist for initiation of sips via cup. Pt answering yes/no questions with 75% accuracy in regards to biographical information and pictures of family. Pt with increased engagment and smiling and laughing appropriately throughout session.    Daily Session FIM:  Comprehension Comprehension Mode: Auditory Comprehension: 2-Understands basic 25 - 49% of the time/requires cueing 51 - 75% of the time Expression Expression Mode: Verbal Expression: 1-Expresses basis less than 25% of the time/requires cueing greater than 75% of the time. Social Interaction Social  Interaction: 1-Interacts appropriately less than 25% of the time. May be withdrawn or combative. Problem Solving Problem Solving: 1-Solves basic less than 25% of the time - needs direction nearly all the time or does not effectively solve problems and may need a restraint for safety Memory Memory: 1-Recognizes or recalls less than 25% of the time/requires cueing greater than 75% of the time Pain Pain Assessment Pain Assessment: No/denies pain  Therapy/Group: Individual Therapy  Carlette Palmatier 10/12/2011, 3:47 PM

## 2011-10-12 NOTE — Progress Notes (Signed)
Physical Therapy Note  Patient Details  Name: Mark [name] MRN: [id number] Date of Birth: 1966-06-12 Today's Date: 10/12/2011  Time: 1300-1400 60 minutes  Patient demonstrated mild facial grimacing with some movement of R knee, relieved with rest.  Focus of treatment on sitting balance and participation in purposeful activity.  Patient required hand over hand assist to initiate rolling to L, +2 total A for sup>sit transfer.  Patient sat EOB x30 minutes with min to max A, some pushing with R UE.  Patient has extremely flexed trunk posture, posterior pelvic rotation and weakness in trunk extension, max A for upright posture despite cuing and manual facilitation.  He was inconsistently able to appropriately use spoon for ice cream, ice chips with SLP and initiate cleaning mouth with towel.  Transferred with sliding board transfer to w/c with +2 total A. Pt resistant to forward trunk flexion for transfer.  Patient was inconsistently able to correct L trunk lean in seated by using R LE. Pt inconsistently able to maintain head in midline with min A.  Attention <5 sec throughout treatment without redirection.    Co-treat with SLP Individual therapy  Lockie Pares 10/12/2011, 3:07 PM

## 2011-10-13 LAB — GLUCOSE, CAPILLARY
Glucose-Capillary: 103 mg/dL — ABNORMAL HIGH (ref 70–99)
Glucose-Capillary: 133 mg/dL — ABNORMAL HIGH (ref 70–99)
Glucose-Capillary: 134 mg/dL — ABNORMAL HIGH (ref 70–99)

## 2011-10-13 MED ORDER — DANTROLENE SODIUM 25 MG PO CAPS
25.0000 mg | ORAL_CAPSULE | Freq: Two times a day (BID) | ORAL | Status: DC
  Administered 2011-10-13 (×2): 25 mg via ORAL
  Filled 2011-10-13 (×5): qty 1

## 2011-10-13 NOTE — Progress Notes (Signed)
Occupational Therapy Session Note  Patient Details  Name: Mark [name] MRN: [id number] Date of Birth: 1965-10-28  Today's Date: 10/13/2011 Time: 1020-1050 Time Calculation (min): 30 min  Short Term Goals: Week 1:  OT Short Term Goal 1 (Week 1): Pt will tolerate sitting EOB for 15 min with mod A in prep for functional activity OT Short Term Goal 2 (Week 1): Pt will demonstrated focused attention to take familar grooming item from therapist in prep for performing tasks OT Short Term Goal 3 (Week 1): Pt will roll to left in bed for clothing management/hygiene with total A of one person. OT Short Term Goal 4 (Week 1): Pt will answer yes/ no questions with 80% accuracy for familar ADL/ orientation tasks  Skilled Therapeutic Interventions/Progress Updates:    1:1 cognitive retraining: focus on answering yes/no questions with 50% accuracy (lower than normal due to ?fatigue), sorting colored chips with mod (50%) accuracy from a field of two. Focus on initiation with tactile prompt each time two chips presented. Transferred back to bed with total A +2 (pt 0%) with decreased forward posture. To rest.  Therapy Documentation Precautions:  Precautions Precautions: Fall Required Braces or Orthoses: Cervical Brace Cervical Brace: Hard collar;Applied in supine position Restrictions Weight Bearing Restrictions: No Pain:  no c/o pain  See FIM for current functional status  Therapy/Group: Individual Therapy  [name] [facility] 10/13/2011, 10:52 AM

## 2011-10-13 NOTE — Progress Notes (Signed)
Patient alert, responding to questions by nodding his head yes or no. Trach site patent. Trach care done at 0500. Trach site with small amount thick yellow secretions. Suctioned trach site x 1. Peg site care done. Peg site with small amount reddish brown crusty drainage. Patient no unsafe behavior. A. Jomes Giraldo

## 2011-10-13 NOTE — Progress Notes (Signed)
Speech Language Pathology Daily Session Note  Patient Details  Name: Mark [name] MRN: [id number] Date of Birth: August 25, 1965  Today's Date: 10/13/2011 Time: 1330-1400 Time Calculation (min): 30 min  Short Term Goals:  SLP Short Term Goal 1 (Week 1): Pt will demonstrate focused attention to a functional and familiar task for ~60 secs with Max A verbal and tactile cues. SLP Short Term Goal 2 (Week 1): Pt will track to left space during functional and familair tasks with Max A verbal and visual cues. SLP Short Term Goal 3 (Week 1): Pt will answer yes/no questions with multimodal communication with ~75% accuracy. SLP Short Term Goal 4 (Week 1): Pt will tolerate PMSV for ~60 minutes without signs of distress with supervision.  SLP Short Term Goal 5 (Week 1): pt will initiate trials during self-feeding task with Max A verbal and tactile cues.   Skilled Therapeutic Interventions: Co-tx with Pt with focus on appropriate positioning, initiation and sustained attention during PO trials. PMSV donned and pt required Max verbal and tactile cues to respond to yes/no questions via head nods. All questions appeared to be answered appropriately. Pt performed oral care with Min verbal cues and appropriately spit in a cup with visual and verbal cues. Max-Total A for initiation of bites/sips with verbal and tactile cues. Pt with cough x 1 with puree textures due to decreased AP transit, however, pt demonstrated an increased swallow iniaition as trials progressed. Pt sustained attention to task for ~10 seconds. Pt able to appropriately sort colors from field of 3.   Daily Session FIM:  Comprehension Comprehension: 1-Understands basic less than 25% of the time/requires cueing 75% of the time Expression Expression Mode: Verbal Expression: 1-Expresses basis less than 25% of the time/requires cueing greater than 75% of the time. Social Interaction Social Interaction: 1-Interacts appropriately less than 25% of the  time. May be withdrawn or combative. Problem Solving Problem Solving: 1-Solves basic less than 25% of the time - needs direction nearly all the time or does not effectively solve problems and may need a restraint for safety Memory Memory: 1-Recognizes or recalls less than 25% of the time/requires cueing greater than 75% of the time Pain Pain Assessment Pain Assessment: PAINAD Pain Score: Asleep Faces Pain Scale: No hurt Pain Location: Leg Pain Orientation: Right PAINAD (Pain Assessment in Advanced Dementia) Breathing: normal Negative Vocalization: occasional moan/groan, low speech, negative/disapproving quality Facial Expression: smiling or inexpressive Body Language: relaxed Consolability: no need to console PAINAD Score: 1   Therapy/Group: Individual Therapy  Jaishaun Mcnab 10/13/2011, 2:44 PM

## 2011-10-13 NOTE — Progress Notes (Signed)
Occupational Therapy Session Note  Patient Details  Name: Mark [name] MRN: [id number] Date of Birth: 04-Jul-1965  Today's Date: 10/13/2011 Time: 0730-0830 Time Calculation (min): 60 min  Short Term Goals: Week 1:  OT Short Term Goal 1 (Week 1): Pt will tolerate sitting EOB for 15 min with mod A in prep for functional activity OT Short Term Goal 2 (Week 1): Pt will demonstrated focused attention to take familar grooming item from therapist in prep for performing tasks OT Short Term Goal 3 (Week 1): Pt will roll to left in bed for clothing management/hygiene with total A of one person. OT Short Term Goal 4 (Week 1): Pt will answer yes/ no questions with 80% accuracy for familar ADL/ orientation tasks  Skilled Therapeutic Interventions/Progress Updates:    1:1 self care retraining focus on attention task: focused to sustained with max verbal cuing and max tactile cuing to perform bed mobility rolling to don pants, transition to EOB for donning shirt, apply deodorant and lotion with hand over hand to initiate task. Pt needed mod A for EOB balance without UE support, when pt used right UE for support on EOB he could be min A, sat EOB for 10 min. Pt with increased head control through out session. Transferred bed to w/c via slide board with total A +2, pt able to initiate a forward weight shift but more fearful today  Of staying forward. Once in chair performed oral mouth care following one step motor commands with max A (stick out your tongue, open your mouth, show me your teeth, unscrewing toothpaste cap, squeezing toothpaste on toothbrush). Gave ice chip via spoon focusing on self feeding with setup and mod initiation cues. Unaware of being in the hospital via yes/no questions for orientation.  Therapy Documentation Precautions:  Precautions Precautions: Fall Required Braces or Orthoses: Cervical Brace Cervical Brace: Hard collar;Applied in supine position Restrictions Weight Bearing  Restrictions: No Pain:  no c/o pain  See FIM for current functional status  Therapy/Group: Individual Therapy  [name] [facility] 10/13/2011, 8:32 AM

## 2011-10-13 NOTE — Progress Notes (Signed)
Per Dr. Riley Kill- PT trach downsized to #4 Shiley CFLS. Two RT present- RT performed oral and tracheal suctioning prior to removing #6 Shiley CFLS. Sp02 pre removal 97% on RA, Sp02 post installation of #4 Shiley CFLS 98% on RA. Trach site was clean, dry, WNL. RN was present to ensure cervical spine was safe. Entire procedure was uneventful- resulted in positive End Tidal and bilateral breath sounds- PT is in no distress at this time.

## 2011-10-13 NOTE — Progress Notes (Signed)
Physical Therapy Note  Patient Details  Name: Mark [name] MRN: [id number] Date of Birth: 09-10-1965 Today's Date: 10/13/2011  Time: 4098-1191 55 minutes  Patient had facial grimacing and pointed to knee when asked location of pain, RN aware.  Standing frame for 10 minutes and 15 minutes for trunk control, WB through R LE to reduce tone.  Patient required min A for trunk control, max A to maintain midline with pushing on R UE with fatigue.  Patient able to complete sorting task with 2-3 colors with 90% accuracy, delayed initiation, reluctant to reach with R UE for blocks but able to reach to his face/leg without difficulty.  Attempted throwing with football in sitting; patient able to place football with max cuing and encouragement, reach with R UE within BOS in sitting.  Decreased attention <5 sec, diaphoretic throughout treatment, easily distracted in noisy environment.  Patient able to intermittently <50% respond appropriately to yes/no questions with nodding.  Individual therapy  Lockie Pares 10/13/2011, 12:34 PM

## 2011-10-13 NOTE — Progress Notes (Addendum)
Nutrition Follow-up  RN reports pt tolerating TF well at this time with no residuals.  Diet Order:  NPO TF: Bolus of Jevity 1.2, 420 ml, 5 times daily. 30 ml Prostat via tube BID. This provides: 2664 kcal, 148 grams protein; meeting 100% of estimated protein and energy needs.  Meds: Scheduled Meds:   . chlorhexidine  15 mL Mouth/Throat QID  . citalopram  20 mg Per Tube QHS  . clonazePAM  0.5 mg Per Tube QHS  . dantrolene  25 mg Oral BID  . feeding supplement (JEVITY 1.2 CAL)  420 mL Per Tube 5 X Daily  . feeding supplement  30 mL Per Tube BID  . ferrous sulfate  300 mg Per Tube BID WC  . free water  200 mL Per Tube QID  . insulin aspart  0-9 Units Subcutaneous Q4H  . methylphenidate  10 mg Per Tube BID WC  . metoprolol tartrate  50 mg Per Tube BID  . pantoprazole sodium  40 mg Per Tube Q1200   Continuous Infusions:  PRN Meds:.acetaminophen, alum & mag hydroxide-simeth, bisacodyl, guaiFENesin-dextromethorphan, hydrocortisone cream, oxyCODONE, polyethylene glycol, prochlorperazine, prochlorperazine, prochlorperazine, traMADol, traZODone  Labs:  CMP     Component Value Date/Time   NA 142 10/09/2011 0735   K 4.1 10/09/2011 0735   CL 105 10/09/2011 0735   CO2 26 10/09/2011 0735   GLUCOSE 121* 10/09/2011 0735   BUN 24* 10/09/2011 0735   CREATININE 0.69 10/09/2011 0735   CALCIUM 10.1 10/09/2011 0735   PROT 7.2 10/09/2011 0735   ALBUMIN 2.9* 10/09/2011 0735   AST 16 10/09/2011 0735   ALT 22 10/09/2011 0735   ALKPHOS 94 10/09/2011 0735   BILITOT 0.3 10/09/2011 0735   GFRNONAA >90 10/09/2011 0735   GFRAA >90 10/09/2011 0735   CBG (last 3)   Basename 10/13/11 1129 10/13/11 0712 10/13/11 0407  GLUCAP 117* 148* 103*     Intake/Output Summary (Last 24 hours) at 10/13/11 1429 Last data filed at 10/13/11 0600  Gross per 24 hour  Intake      0 ml  Output   1250 ml  Net  -1250 ml   Weight Status:  115.4 kg (4/12) - no new wt  Re-estimated needs:  2500 - 2800 kcal, 145 - 160 grams  protein  Nutrition Dx:  Inadequate oral intake r/t inability to eat AEB NPO status.  Goal:  Pt to tolerate TF. TF to meet >90% of estimated needs. Met.  Intervention:  Continue current TF regimen. Will continue to monitor for tolerance and weight maintenance.  Monitor:  TF tolerance, weights, labs, I/O's  Adair Laundry Pager #:  [phone]

## 2011-10-13 NOTE — Plan of Care (Signed)
Problem: RH BLADDER ELIMINATION Goal: RH STG MANAGE BLADDER WITH MEDICATION WITH ASSISTANCE STG Manage Bladder With Medication With min Assistance.  Outcome: Not Applicable Date Met:  10/13/11  Continue with foley until mobility increases . Goal: RH STG MANAGE BLADDER WITH EQUIPMENT WITH ASSISTANCE STG Manage Bladder With Equipment With min Assistance  Outcome: Not Applicable Date Met:  10/13/11 Continue with foley until mobility increases.  Problem: RH SKIN INTEGRITY Goal: RH STG ABLE TO PERFORM INCISION/WOUND CARE W/ASSISTANCE STG Able To Perform Incision/Wound Care With min Assistance.  Outcome: Not Progressing Patient unable to be perform wound care at this time.  Problem: RH COGNITION-NURSING Goal: RH STG ANTICIPATES NEEDS/CALLS FOR ASSIST W/ASSIST/CUES STG Anticipates Needs/Calls for Assist With mod Assistance/Cues.  Outcome: Not Progressing  Patient not using call bell or able to verbalize needs .

## 2011-10-13 NOTE — Progress Notes (Signed)
Physical Therapy Note  Patient Details  Name: Mark [name] MRN: [id number] Date of Birth: [birth year] Today's Date: 10/13/2011  Time: 1300-1400 60 minutes  Patient rubbing R knee; pain relieved with repositioning.  Patient required total A for initiation of bed mobility, completing sup>sit transfer.  +1 Max A for unsupporting sitting balance at EOB, decreased trunk control ? Due to fatigue from morning session, pt attempting to push with R UE.  Transferred to w/c with sliding board transfer and +3 assist, pt 10%.  Patient resistant to forward trunk lean to complete transfer.  Patient independently able to correct L lateral trunk lean in w/c without cuing.  Patient performed oral hygiene with SLP, using R hand for bringing toothbrush/spoon to his mouth.  Decreased initiation with max cuing, hand over hand assist to initiate bringing spoon to mouth.  Grasping and gripping cup with L hand with hand over hand assist to increase attention and awareness of L side.  Patient completed sorting activity with 3 colors with 80% accuracy with cuing.  Pt had decreased attention <5 sec throughout treatment, no evidence of diaphoresis.  Pt with increased fatigue at end of session, required increased time for initiation.  Co-treat with SLP Individual therapy  Lockie Pares 10/13/2011, 3:10 PM
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
## 2011-10-14 DIAGNOSIS — W320XXA Accidental handgun discharge, initial encounter: Secondary | ICD-10-CM

## 2011-10-14 DIAGNOSIS — Z5189 Encounter for other specified aftercare: Secondary | ICD-10-CM

## 2011-10-14 DIAGNOSIS — S069X9A Unspecified intracranial injury with loss of consciousness of unspecified duration, initial encounter: Secondary | ICD-10-CM

## 2011-10-14 LAB — URINE CULTURE: Colony Count: 80000

## 2011-10-14 LAB — GLUCOSE, CAPILLARY
Glucose-Capillary: 135 mg/dL — ABNORMAL HIGH (ref 70–99)
Glucose-Capillary: 136 mg/dL — ABNORMAL HIGH (ref 70–99)
Glucose-Capillary: 152 mg/dL — ABNORMAL HIGH (ref 70–99)
Glucose-Capillary: 91 mg/dL (ref 70–99)

## 2011-10-14 MED ORDER — METHYLPHENIDATE HCL 5 MG PO TABS
15.0000 mg | ORAL_TABLET | Freq: Two times a day (BID) | ORAL | Status: DC
  Administered 2011-10-14 – 2011-10-19 (×10): 15 mg
  Filled 2011-10-14 (×10): qty 3

## 2011-10-14 MED ORDER — TRAZODONE HCL 50 MG PO TABS
100.0000 mg | ORAL_TABLET | Freq: Every day | ORAL | Status: DC
  Administered 2011-10-14 – 2011-10-18 (×5): 100 mg via ORAL
  Filled 2011-10-14 (×5): qty 2

## 2011-10-14 MED ORDER — DANTROLENE SODIUM 25 MG PO CAPS
25.0000 mg | ORAL_CAPSULE | Freq: Three times a day (TID) | ORAL | Status: DC
  Administered 2011-10-14 – 2011-10-19 (×16): 25 mg via ORAL
  Filled 2011-10-14 (×19): qty 1
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
## 2011-10-14 NOTE — Progress Notes (Signed)
Speech Language Pathology Daily Session Note  Patient Details  Name: Mark [name] MRN: [id number] Date of Birth: 07-29-1965  Today's Date: 10/14/2011 Time: 4098-1191 Time Calculation: 60 mins  Short Term Goals:  SLP Short Term Goal 1 (Week 1): Pt will demonstrate focused attention to a functional and familiar task for ~60 secs with Max A verbal and tactile cues. SLP Short Term Goal 2 (Week 1): Pt will track to left space during functional and familair tasks with Max A verbal and visual cues. SLP Short Term Goal 3 (Week 1): Pt will answer yes/no questions with multimodal communication with ~75% accuracy. SLP Short Term Goal 4 (Week 1): Pt will tolerate PMSV for ~60 minutes without signs of distress with supervision.  SLP Short Term Goal 5 (Week 1): pt will initiate trials during self-feeding task with Max A verbal and tactile cues.   Skilled Therapeutic Interventions: Treatment focus on initiation, attention and purposeful behavior. Pt appears more restless and fatigued today. PMSV donned and responded with head nods to yes/no questions 25% of the time. Pt reported pain and pointed to his knee. RN made aware and medications given. Total hand over hand assist to initiate trials of ice and puree textures. Pt with overt cough with 2 out of 5 trials. Pt total A for functional communication to pointing to familiar family members in pictures. Focused attention for ~10 seconds.    Daily Session FIM:  Comprehension Comprehension Mode: Auditory Comprehension: 1-Understands basic less than 25% of the time/requires cueing 75% of the time Expression Expression Mode: Verbal Expression Assistive Devices: 6-Talk trach valve Expression: 1-Expresses basis less than 25% of the time/requires cueing greater than 75% of the time. Social Interaction Social Interaction: 1-Interacts appropriately less than 25% of the time. May be withdrawn or combative. Problem Solving Problem Solving: 1-Solves basic less than  25% of the time - needs direction nearly all the time or does not effectively solve problems and may need a restraint for safety Memory Memory: 1-Recognizes or recalls less than 25% of the time/requires cueing greater than 75% of the time Pain Pain Assessment Pain Location: Leg Pain Orientation: Left Pain Intervention(s): Medication (See eMAR);RN made aware;Repositioned  Therapy/Group: Individual Therapy  Maudie Shingledecker 10/14/2011, 4:45 PM

## 2011-10-14 NOTE — Progress Notes (Signed)
Trach care done at 2230 and 0630. Patient with scant amount thick yellow secretions at trach site. Patient able to cough secretions up himself. Peg care done at 0630. Peg site with small amount crusty reddish brown drainage. No facial expressions or grimace for symptoms of pain. Patient shakes his head "no". Mother at bedside for support.

## 2011-10-14 NOTE — Consult Note (Signed)
Neuropsychology  Name: Mark [name]  MRN: [id number]  Date of Birth: 1966/05/26   Reviewed chart and discussed with MSW and SLP. Patient currently lacks sufficient cognitive or communication skills to allow for psychological evaluation of mood. Please re-consult if desired after patient reaches at least a [place] Level VI.  Gladstone Pih, Ph.D

## 2011-10-14 NOTE — Progress Notes (Signed)
Social Work Patient ID: Mark [name], male   DOB: [birth year], 46 y.o.   MRN: [id number]   Met with patient's mother today to offer continued support and to see if any concerns/ questions for me at this time.  Mother is currently working on pt's Medicaid application process and did have some concerns about needed paperwork and information she was being asked to provide.  I directed her to contact the Medicaid Case Worker directly to ask these questions and to speak with her liason with [employer] to ensure her comfort with information she provides on pt's behalf.   Also, I provided mother with letter from MD to state pt's general medical and cognitive status should agencies request this information from her.    Will continue to follow.    Pia Jedlicka

## 2011-10-14 NOTE — Progress Notes (Signed)
Physical Therapy Note  Patient Details  Name: Mark [name] MRN: [id number] Date of Birth: [birth year] Today's Date: 10/14/2011  Time: 0930-1025 55 minutes  Pt rubbing R knee with knee flexion; pain relieved by repositioning.  Focus of treatment on participation in purposeful activities.  Standing frame up to 10 minutes x2 with mod-max A for trunk control, pt pushing with R UE towards L side, increased difficulty controlling anterior/posterior trunk deviations.  Pt encouraged to participate in color sorting activity with max encouragement, hand over hand assist on R for initiation with 100% accuracy in selecting between 2-3 colors.  Pt able to water plants with hand over hand assist for initiation, demonstration.  Seated ball throwing activity with hand over hand assist on L to encourage attention and awareness of L UE.  Pt had decreased attention throughout <3 sec, easily distractible by presence of other people in room and required max cuing for participation.    Individual therapy  Lockie Pares 10/14/2011, 12:07 PM

## 2011-10-14 NOTE — Progress Notes (Signed)
Physical Therapy Note  Patient Details  Name: Mark [name] MRN: [id number] Date of Birth: Dec 14, 1965 Today's Date: 10/14/2011  Time: 1415-1445 30 minutes  No c/o pain.  Attempted to engage pt in rolling to R and L with hand over hand assist for use of bedrails, pt <10% assist. Sup<>sit transfers with total A, pt <10% assist.  Pt sat EOB with mod-max A for maintaining upright midline posture with decreased attempts to push with R UE.  Pt performed oral hygiene with hand over hand assist to initiate, max encouragement. Pt unwilling to reach within BOS to grasp objects.  Transferred to w/c with [facility] lift.  Pt with decreased focused attention and limited participation despite cuing and assist, responding to <25% of yes/no questions.  Individual therapy  Lockie Pares 10/14/2011, 3:50 PM

## 2011-10-14 NOTE — Care Management Note (Signed)
Patient ID: Mark [name], male   DOB: April 04, 1966, 46 y.o.   MRN: [id number] Spoke w/ mother about team conference: ELOS @ 6 weeks.  Mother is aware of trach downsizing & plan to d/c trach soon.  She is w/ pt much of the day.

## 2011-10-14 NOTE — Progress Notes (Signed)
Occupational Therapy Session Note  Patient Details  Name: Mark [name] MRN: [id number] Date of Birth: 1965/12/24  Today's Date: 10/14/2011 Time: 0830-0930 Time Calculation (min): 60 min  Short Term Goals: Week 1:  OT Short Term Goal 1 (Week 1): Pt will tolerate sitting EOB for 15 min with mod A in prep for functional activity OT Short Term Goal 2 (Week 1): Pt will demonstrated focused attention to take familar grooming item from therapist in prep for performing tasks OT Short Term Goal 3 (Week 1): Pt will roll to left in bed for clothing management/hygiene with total A of one person. OT Short Term Goal 4 (Week 1): Pt will answer yes/ no questions with 80% accuracy for familar ADL/ orientation tasks  Skilled Therapeutic Interventions/Progress Updates:    1:1 self care retraining: focus on bed mobility with attention to turning head/eyes in the direction rolling, reaching for bed rail with bilateral hands, increased initiation with hip movement for hygiene from BM and LB clothing management. Transitioned to EOB with bed pad with total A. Pt demonstrated increased sitting balance without UE support, initially with max to total A but then able to sit statically with min A with support to left trunk demonstrating increased postural adjustments and forward weight shifts. Focus on focused to sustained attention for donning deodorant, lotion on LEs and shirt with increased right hand initiation with mod tactile cuing, and grasping with left hand for holding hand or container.Pt with increased head control in unsupported and supported sitting. Demonstrated appropriate laughing at jokes. Slide board transfer with +2 with focus on pt weight shifting forward and remaining forward. Pt also able to demonstrate slight flexion of right knee with knee supported with gravity.  Therapy Documentation Precautions:  Precautions Precautions: Fall Required Braces or Orthoses: Cervical Brace Cervical Brace: Hard  collar;Applied in supine position Restrictions Weight Bearing Restrictions: No Pain:  grimacing and rubbing right knee with head nod for pain , RN notified and gave meds  See FIM for current functional status  Therapy/Group: Individual Therapy  [name] [facility] 10/14/2011, 10:13 AM

## 2011-10-15 ENCOUNTER — Inpatient Hospital Stay (HOSPITAL_COMMUNITY): Payer: BC Managed Care – PPO

## 2011-10-15 DIAGNOSIS — S069X9A Unspecified intracranial injury with loss of consciousness of unspecified duration, initial encounter: Secondary | ICD-10-CM

## 2011-10-15 DIAGNOSIS — Z5189 Encounter for other specified aftercare: Secondary | ICD-10-CM

## 2011-10-15 DIAGNOSIS — W320XXA Accidental handgun discharge, initial encounter: Secondary | ICD-10-CM

## 2011-10-15 NOTE — Progress Notes (Signed)
Speech Language Pathology Daily Session Note  Patient Details  Name: Mark [name] MRN: [id number] Date of Birth: [birth year]  Today's Date: 10/15/2011 Time: 4098-1191 Time Calculation (min): 60 min  Short Term Goals:  SLP Short Term Goal 1 (Week 1): Pt will demonstrate focused attention to a functional and familiar task for ~60 secs with Max A verbal and tactile cues. SLP Short Term Goal 2 (Week 1): Pt will track to left space during functional and familair tasks with Max A verbal and visual cues. SLP Short Term Goal 3 (Week 1): Pt will answer yes/no questions with multimodal communication with ~75% accuracy. SLP Short Term Goal 4 (Week 1): Pt will tolerate PMSV for ~60 minutes without signs of distress with supervision.  SLP Short Term Goal 5 (Week 1): pt will initiate trials during self-feeding task with Max A verbal and tactile cues.   Skilled Therapeutic Interventions: Treatment focus on functional communication, initiation, sustained attention and purposeful behavior. Pt performed oral care with Min A verbal and visual cues. Pt required Max verbal, visual and tactile cues to initiate bites/sips of liquids. Pt with intermittent cough with trials secondary to large bites/sips. Pt "fidgeting" in chair and when asked what hurt, pt responded, "butt cheeks." Pt then proceeded to verbally answer biographical questions correctly in regards to name, age, date, identifying family in pictures and how he received the nickname "Ducky."  Pt also reported that he recently had pain medication and that he was not "due" for medication yet.   Daily Session FIM:  Comprehension Comprehension Mode: Auditory Comprehension: 2-Understands basic 25 - 49% of the time/requires cueing 51 - 75% of the time Expression Expression Mode: Verbal;Nonverbal Expression: 2-Expresses basic 25 - 49% of the time/requires cueing 50 - 75% of the time. Uses single words/gestures. Social Interaction Social Interaction: 1-Interacts  appropriately less than 25% of the time. May be withdrawn or combative. Problem Solving Problem Solving: 1-Solves basic less than 25% of the time - needs direction nearly all the time or does not effectively solve problems and may need a restraint for safety Memory Memory: 1-Recognizes or recalls less than 25% of the time/requires cueing greater than 75% of the time Pain Pain Assessment Pain Score:   5 Pain Location: Leg Pain Orientation: Left Pain Descriptors: Discomfort Pain Frequency: Occasional Pain Onset: With Activity Patients Stated Pain Goal: 2 Pain Intervention(s): Medication (See eMAR) PAINAD (Pain Assessment in Advanced Dementia) Breathing: normal Negative Vocalization: occasional moan/groan, low speech, negative/disapproving quality Facial Expression: facial grimacing Body Language: relaxed Consolability: no need to console PAINAD Score: 3   Therapy/Group: Individual Therapy  Shacara Cozine 10/15/2011, 4:22 PM

## 2011-10-15 NOTE — Progress Notes (Signed)
Patient noted pulling hair out on tip of head mitten applied to right hand and patient distracted and stopped pulling out hair. o2 sat at 94-100% on room air . Small amount yellow thick secretions. No suctioning required throughout the day . c-collar intact . Oxycodone 5mg  given via peg with relief of right leg pain this pm . Patient able to nod head and spoke short words at a whisper to ST. Continue with plan of care.                            Mark [name]
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
## 2011-10-15 NOTE — Progress Notes (Signed)
Occupational Therapy Session Note  Patient Details  Name: Mark [name] MRN: [id number] Date of Birth: 11/09/1965  Today's Date: 10/15/2011 Time: 1478-2956 Time Calculation (min): 45 min  Short Term Goals: Week 1:  OT Short Term Goal 1 (Week 1): Pt will tolerate sitting EOB for 15 min with mod A in prep for functional activity OT Short Term Goal 2 (Week 1): Pt will demonstrated focused attention to take familar grooming item from therapist in prep for performing tasks OT Short Term Goal 3 (Week 1): Pt will roll to left in bed for clothing management/hygiene with total A of one person. OT Short Term Goal 4 (Week 1): Pt will answer yes/ no questions with 80% accuracy for familar ADL/ orientation tasks  Skilled Therapeutic Interventions/Progress Updates:    1:1 self care retraining with heavy emphasis on cognition. Bed mobility with focus on turning eyes in direction rolling, increased initiation to use right hand to reach for rail and move upper trunk once hips were rotated. Pt communicated through yes/ no - he was very tired today. Mother did report he slept well last night. Once transitioned to EOB with total A+2 pt with posterior lean and pushing, able to transition to a neurtral upright sitting position with mod to max A (occassional min A). Pt with more difficulty maintaining forward posture. Also pt demonstrated decreased right knee flexion even with force. Could only approximally achiehve 20-25 degrees of flexion with max encouragement with gravity assisted with leg supported. Focused on right hand use with moderate  tactile cuing for initiating  For UB dressing and grooming (washing face, teeth brushing, donning deodorant)  Therapy Documentation Precautions:  Precautions Precautions: Fall Required Braces or Orthoses: Cervical Brace Cervical Brace: Hard collar;Applied in supine position Restrictions Weight Bearing Restrictions: No Vital Signs: Therapy Vitals Pulse Rate: 83  Oxygen  Therapy O2 Device: None (Room air) Pain:  rubbing right knee in session- RN notified, helped pt reposition  See FIM for current functional status  Therapy/Group: Individual Therapy  [name] [facility] 10/15/2011, 1:46 PM

## 2011-10-15 NOTE — Progress Notes (Signed)
Occupational Therapy Session Note  Patient Details  Name: Mark [name] MRN: [id number] Date of Birth: 1965/07/26  Today's Date: 10/15/2011 Time: 4098-1191 Time Calculation (min): 15 min  Short Term Goals: Week 1:  OT Short Term Goal 1 (Week 1): Pt will tolerate sitting EOB for 15 min with mod A in prep for functional activity OT Short Term Goal 2 (Week 1): Pt will demonstrated focused attention to take familar grooming item from therapist in prep for performing tasks OT Short Term Goal 3 (Week 1): Pt will roll to left in bed for clothing management/hygiene with total A of one person. OT Short Term Goal 4 (Week 1): Pt will answer yes/ no questions with 80% accuracy for familar ADL/ orientation tasks  Skilled Therapeutic Interventions/Progress Updates:  See SLP progress notes for full details.  Patient verbalizing, at a whisper, numerous times in session, oral care then ice chips, water and ice cream, identified individuals in 4 photos.  Hand over hand with left hand to pick up ~1" blocks as he named the colors then released them with max assist.  Precautions:  Precautions: Fall Required Braces or Orthoses: Cervical Brace Cervical Brace: Hard collar;Applied in supine position Weight Bearing Restrictions: No  Pain: Patient reported that his "butt cheeks" were hurting.  Patient also rubbing his right knee.  RN aware and medication to follow.  See FIM for current functional status  Therapy/Group: Co-Treatment with Speech Therapy  Mark [name] 10/15/2011, 5:01 PM

## 2011-10-15 NOTE — Progress Notes (Signed)
Physical Therapy Note  Patient Details  Name: Mark [name] MRN: [id number] Date of Birth: 1966/05/31 Today's Date: 10/15/2011  Time: 0930-1015  45 minutes  Pt rubbing R knee; offered to apply lotion post.  Transferred w/c<>mat with sliding board and +3 assist, pt <10% assist, resisting forward trunk flexion for transfer.  Pt sat at edge of mat x15 minutes for purposeful sorting activities with silverware, 90% accurate with 2 options and 75% accurate with 3.  Pt required min-max A for static sitting balance with increased restlessness and agitation especially towards end of session, scratching with R UE, <5 sec attention.  Decreased initiation requiring hand-over-hand assist for purposeful activities, especially with fatigue.    Individual therapy  Lockie Pares 10/15/2011, 12:07 PM

## 2011-10-15 NOTE — Progress Notes (Signed)
Physical Therapy Note  Patient Details  Name: Mark [name] MRN: [id number] Date of Birth: [birth year] Today's Date: 10/15/2011  Time: 1300-1330 30 minutes  No c/o pain.  Rolling to R with hand over hand assist for use of bed rails, noted slight initiation at shoulders for assisting with roll to R, overall pt <10% assist for sup>sit transfer.  Sliding board transfer with +3 total assist with pt initiating assisting with R UE for initial placement of sliding board.  Throwing football activity in w/c with brother.  Pt able to intermittently 25% initiate throwing or placing of ball with R UE.  Hand over hand assist for use of L UE to increase awareness and attention to L side, noted initiation of movement in L fingers to assist with holding ball.  <5 sec attention span but increased participation overall with brother.  Individual therapy  Lockie Pares 10/15/2011, 3:27 PM

## 2011-10-16 LAB — GLUCOSE, CAPILLARY
Glucose-Capillary: 118 mg/dL — ABNORMAL HIGH (ref 70–99)
Glucose-Capillary: 165 mg/dL — ABNORMAL HIGH (ref 70–99)

## 2011-10-16 NOTE — Progress Notes (Signed)
Occupational Therapy Session Note  Patient Details  Name: Haseeb Fiallos MRN: [id number] Date of Birth: [birth year]  Today's Date: 10/16/2011 Time: 1400-1430 Time Calculation (min): 30 min  Skilled Therapeutic Interventions/Progress Updates: Patient seen this pm for 1:1 session with OT. Worked with patient to achieve increased active knee flexion to allow him to prepare for more active transfers.  Assisted patient to flex upper trunk over lower trunk in preparation for transfers.  Patient reported low back stretching pain.  Neuromuscular reeducation to left upper extremity.  Patient able to complete composite flexion in left hand.  Provided orientation to patient.       Therapy Documentation Precautions:  Precautions Precautions: Fall Required Braces or Orthoses: Cervical Brace Cervical Brace: Hard collar;Applied in supine position Restrictions Weight Bearing Restrictions: No Trach with PMV during therapy  Pain: Pain Assessment Pain Assessment: Faces Faces Pain Scale: Hurts a little bit Pain Type: Acute pain Pain Location: Knee Pain Orientation: Right Pain Intervention(s): Repositioned    See FIM for current functional status  Therapy/Group: Individual Therapy  Collier Salina 10/16/2011, 3:11 PM
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
## 2011-10-16 NOTE — Progress Notes (Signed)
Physical Therapy Weekly Progress Note  Patient Details  Name: Mark [name] MRN: [id number] Date of Birth: Nov 05, 1965  Today's Date: 10/16/2011  Patient has met 1 of 3 short term goals.  Pt is able to attend to a functional task for 10 seconds (brushing teeth, eating ice cream) with min cuing, but continues to be limited in participation with functional mobility.  He is able to inconsistently initiate bed mobility and assisting with placement of sliding board, but continues to be <10% participation overall for bed mobility and transfers.  He demonstrates minimal sustained attention and no selective attention in noisy/busy environments.  Impaired initiation requiring hand over hand assist for UE tasks.  Patient continues to demonstrate the following deficits: muscle weakness, impaired initiation, attention, awareness, decreased functional mobility, decreased functional use of L side, and rigid R LE extensor tone and therefore will continue to benefit from skilled PT intervention to enhance overall performance with activity tolerance, balance, postural control, ability to compensate for deficits, functional use of  right upper extremity, right lower extremity, left upper extremity and left lower extremity, attention, awareness and coordination.  Patient progressing toward long term goals..  Continue plan of care.  PT Short Term Goals Week 1:  PT Short Term Goal 1 (Week 1): Pt will attend to functional task x 10 seconds with min cues PT Short Term Goal 1 - Progress (Week 1): Met PT Short Term Goal 2 (Week 1): pt will maintain sitting balance with mod A x 3 minutes PT Short Term Goal 2 - Progress (Week 1): Progressing toward goal PT Short Term Goal 3 (Week 1): initiate forward wt shift to assist with transfers 50 % of the time PT Short Term Goal 3 - Progress (Week 1): Progressing toward goal Week 2:  PT Short Term Goal 1 (Week 2): pt will attend to functional task x30 seconds with min cuing PT Short  Term Goal 2 (Week 2): pt will maintain sitting balance with mod A x 3 minutes PT Short Term Goal 3 (Week 2): initiate forward wt shift to assist with transfers 50 % of the time  Skilled Therapeutic Interventions/Progress Updates:  Ambulation/gait training;Balance/vestibular training;Cognitive remediation/compensation;Discharge planning;DME/adaptive equipment instruction;Functional mobility training;Patient/family education;Pain management;Neuromuscular re-education;Stair training;Therapeutic Activities;UE/LE Coordination activities;UE/LE Strength taining/ROM;Splinting/orthotics;Therapeutic Exercise;Wheelchair propulsion/positioning    See FIM for current functional status  Therapy/Group: Individual Therapy  Madolin Twaddle 10/16/2011, 4:36 PM

## 2011-10-16 NOTE — Progress Notes (Signed)
Occupational Therapy Note  Patient Details  Name: Nolyn Swab MRN: [id number] Date of Birth: 10/23/1965 Today's Date: 10/16/2011  1:1 OT Session: Time:1100-1200 Pt showed signs of slight pain with right knee flexion; repositioned Individual therapy  Engaged pt in activities requiring pt to chose correct color from choice of 3.  Pt 100% with questioning cues but required tactile cues to initiate selecting correct color with RUE to hand to therapist.  Pt easily distracted by external noises and activity.  Transitioned pt to [facility] gym but pt continued to require max verbal cues to sustain attention more than 5 seconds.  Pt became fidgety in w/c and returned to bed.  Pt tot A + 2 for sliding board transfer with increased extensor posturing during transfer.  Pt situated in bed, PMSV removed, Pulse Ox attached, and call bed placed nearby.    Lavone Neri [facility] 10/16/2011, 12:13 PM

## 2011-10-16 NOTE — Progress Notes (Signed)
Physical Therapy Note  Patient Details  Name: Mark [name] MRN: [id number] Date of Birth: 1965-07-27 Today's Date: 10/16/2011  Time: 1300-1330 30 minutes co tx with ST  No c/o pain.  Bed mobility with <10% patient assist, able to grasp railing with R hand but no initiation at shoulders or hips.  Total A for sup>sit transfer.  Pt initiating placement of sliding board with R UE but otherwise did not assist with sliding board transfer to chair, +3 assist.  Pt at sink for brushing teeth, initiated leaning forward to sink and brushing teeth with verbal cuing.  Pt continues to demonstrate limited attention <10sec requiring continual redirection, verbalizing a few words only and nodding yes/no in response to 25% of questions.  Individual therapy   Lockie Pares 10/16/2011, 3:39 PM

## 2011-10-16 NOTE — Progress Notes (Signed)
Speech Language Pathology Weekly Progress Note  Patient Details  Name: Mark [name] MRN: [id number] Date of Birth: 1965/11/04  Today's Date: 10/16/2011 Time: 1400-1430 Time Calculation (min): 30 min  Short Term Goals:  SLP Short Term Goal 1 (Week 1): Pt will demonstrate focused attention to a functional and familiar task for ~60 secs with Max A verbal and tactile cues. SLP Short Term Goal 1 - Progress (Week 1): Not met SLP Short Term Goal 2 (Week 1): Pt will track to left space during functional and familair tasks with Max A verbal and visual cues. SLP Short Term Goal 2 - Progress (Week 1): Met SLP Short Term Goal 3 (Week 1): Pt will answer yes/no questions with multimodal communication with ~75% accuracy. SLP Short Term Goal 3 - Progress (Week 1): Met SLP Short Term Goal 4 (Week 1): Pt will tolerate PMSV for ~60 minutes without signs of distress with supervision.  SLP Short Term Goal 4 - Progress (Week 4): Met SLP Short Term Goal 5 (Week 1): pt will initiate trials during self-feeding task with Max A verbal and tactile cues.  SLP Short Term Goal 5 - Progress (Week 1): Met  New Short Term Goals:  SLP Short Term Goal 1 (Week 2): Pt will initiate trials during self-feeding task with Mod A visual and tactile cues. SLP Short Term Goal 2 (Week 2): Pt will focus attention during a functional task for 30 secs with Max A verbal cues.  SLP Short Term Goal 3 (Week 2): Pt will verbalize yes/no in reponse to a question 50% of the time.  SLP Short Term Goal 4 (Week 2): Pt will focus attention to left enviornment with Mod verbal and visual cues.   Weekly Progress Updates: Pt has made great progress and has met 4 out of 5 STG's this reporting period. Currently, pt demonstrates behavior consistent with a Rancho Level III and is Max-Total A for initiation, purposeful behavior, focused attention, orientation, intellectual awareness, auditory comprehension and expression with multimodal communication. Pt is  tolerating PMSV during all waking hours without distress and has begun verbalized at the basic word level. Pt also consuming trials of thin liquids and puree textures with intermittent coughing. Pt would benefit from continued skilled SLP services to maximize swallow and cognitive function, functional communication and overall independence.   Daily Session Therapeutic Intervention: Co-tx with PT to facilitate arousal and appropriate positioning for PO trials. Pt performed oral care with Min A verbal and tactile cues and appropriately utilized a cup of water to "swish and spit."  Pt given trials of thin liquids and puree textures. Pt verbalized he wanted chocolate ice cream and refused trials of vanilla ice cream or applesauce. Pt then initiated sip of water via cup with Min A visual and verbal cue and demonstrated what appeared to be perseveration and "swished" the thin liquid throughout his oral cavity for ~2 minutes. Pt eventually expelled liquids when cup was help up to his mouth. Utilized pictures to facilitate verbal expression in regards to biographical information/questions with Max verbal and visual cues and extra processing time.  FIM:  Comprehension Comprehension: 2-Understands basic 25 - 49% of the time/requires cueing 51 - 75% of the time Expression Expression Mode: Verbal;Nonverbal Expression: 1-Expresses basis less than 25% of the time/requires cueing greater than 75% of the time. Social Interaction Social Interaction: 1-Interacts appropriately less than 25% of the time. May be withdrawn or combative. Problem Solving Problem Solving: 1-Solves basic less than 25% of the time - needs direction  nearly all the time or does not effectively solve problems and may need a restraint for safety Memory Memory: 1-Recognizes or recalls less than 25% of the time/requires cueing greater than 75% of the time Pain Pain Assessment Pain Assessment: Faces Faces Pain Scale: Hurts a little bit Pain Type:  Acute pain Pain Location: Knee Pain Orientation: Right Pain Intervention(s): Repositioned Cognition: Overall Cognitive Status: Impaired Arousal/Alertness: Awake/alert Orientation Level: Oriented to person Attention: Focused Focused Attention: Impaired Focused Attention Impairment: Verbal basic;Functional basic Sustained Attention: Impaired Sustained Attention Impairment: Verbal basic;Functional basic Memory: Impaired Memory Impairment: Decreased short term memory;Decreased recall of new information Decreased Short Term Memory: Verbal basic;Functional basic Awareness: Impaired Awareness Impairment: Intellectual impairment Problem Solving: Impaired Problem Solving Impairment: Verbal basic;Functional basic Executive Function:  (all impaired) Safety/Judgment: Impaired Rancho Mirant Scales of Cognitive Functioning: Localized response Oral/Motor: Oral Motor/Sensory Function Overall Oral Motor/Sensory Function: Appears within functional limits for tasks assessed Motor Speech Phonation: Aphonic (speaks at a "whisper" ) Effective Techniques: Increased vocal intensity Comprehension: Auditory Comprehension Overall Auditory Comprehension: Impaired Yes/No Questions: Within Functional Limits Basic Biographical Questions: 76-100% accurate Commands: Impaired One Step Basic Commands: 0-24% accurate Conversation: Simple Interfering Components: Attention;Pain;Processing speed EffectiveTechniques: Extra processing time;Visual/Gestural cues;Repetition Visual Recognition/Discrimination Discrimination:  (pt able to identify colors during sorting task) Reading Comprehension Reading Status: Not tested Expression: Expression Primary Mode of Expression: Verbal (and nonverbal) Verbal Expression Overall Verbal Expression: Impaired Initiation: Impaired Automatic Speech:  (N/A) Level of Generative/Spontaneous Verbalization: Word Pragmatics: Impairment Impairments: Abnormal affect;Eye  contact Effective Techniques:  (yes/no questions) Other Verbal Expression Comments: Pt tolerating PMSV during all therapy hours. Pt mostly communicates with gestures and head nods but has recently been verbalazing basic 1 word answers in response to questions.  Written Expression Dominant Hand: Right Written Expression: Not tested  Therapy/Group: Individual Therapy  Charita Lindenberger 10/16/2011, 3:24 PM

## 2011-10-16 NOTE — Progress Notes (Signed)
Occupational Therapy Session Note  Patient Details  Name: Mark [name] MRN: [id number] Date of Birth: [birth year]  Today's Date: 10/16/2011 Time: 0830-0930 Time Calculation (min): 60 min   Skilled Therapeutic Interventions/Progress Updates: Patient seen this am for dressing and grooming with OT.  Patient assisted up to wheelchair with two helpers, and third person to stabilize wheelchair.  Patient's PMV applied, although no vocalizations this session.  Patient initiated donning shirt with increased time.  Combed hair and brushed teeth, once movement initiated.     Therapy Documentation Precautions:  Precautions Precautions: Fall Required Braces or Orthoses: Cervical Brace Cervical Brace: Hard collar;Applied in supine position Restrictions Weight Bearing Restrictions: No Trach with PMV during therapy      See FIM for current functional status  Therapy/Group: Individual Therapy  Collier Salina 10/16/2011, 9:53 AM

## 2011-10-16 NOTE — Progress Notes (Signed)
Physical Therapy Note  Patient Details  Name: Mark [name] MRN: [id number] Date of Birth: September 22, 1965 Today's Date: 10/16/2011  Time: 1500-1540 40 minutes  No c/o pain.  Pt restless in w/c, nodding "yes" in response to questions about being uncomfortable.  Pt transferred to bed with sliding board and +3 assist, pt participating <10% with transfer.  Pt sat EOB x15 minutes with supervision>max A with fatigue, total A to return to midline from resting on elbows.  Pt attempting to push trunk posteriorly, required total A for repositioning to prevent pt from sliding from bed onto floor.  Pt able to rub lotion into R and L LEs and hands with manual assist for initiation, identify 50% of family in pictures.  Pt <10% assist for sit>sup transfer.  Hand over hand assist to place pt's hands on bed rails, pt initiating repositioning in bed with hips.  Pt with increased fatigue, continues to have decreased attention and severely impaired initiation with all movements.  Individual therapy  Lockie Pares 10/16/2011, 4:02 PM

## 2011-10-17 ENCOUNTER — Inpatient Hospital Stay (HOSPITAL_COMMUNITY): Payer: BC Managed Care – PPO

## 2011-10-17 LAB — GLUCOSE, CAPILLARY
Glucose-Capillary: 118 mg/dL — ABNORMAL HIGH (ref 70–99)
Glucose-Capillary: 110 mg/dL — ABNORMAL HIGH (ref 70–99)

## 2011-10-17 LAB — CBC
HCT: 40.4 % (ref 39.0–52.0)
MCV: 86.9 fL (ref 78.0–100.0)
Platelets: 220 10*3/uL (ref 150–400)
RBC: 4.65 MIL/uL (ref 4.22–5.81)
WBC: 13.3 10*3/uL — ABNORMAL HIGH (ref 4.0–10.5)

## 2011-10-17 LAB — BASIC METABOLIC PANEL
CO2: 24 mEq/L (ref 19–32)
Calcium: 10.2 mg/dL (ref 8.4–10.5)
Glucose, Bld: 138 mg/dL — ABNORMAL HIGH (ref 70–99)
Sodium: 142 mEq/L (ref 135–145)

## 2011-10-17 LAB — DIFFERENTIAL
Eosinophils Relative: 0 % (ref 0–5)
Lymphocytes Relative: 8 % — ABNORMAL LOW (ref 12–46)
Lymphs Abs: 1 10*3/uL (ref 0.7–4.0)

## 2011-10-17 MED ORDER — KCL IN DEXTROSE-NACL 20-5-0.45 MEQ/L-%-% IV SOLN
INTRAVENOUS | Status: DC
  Administered 2011-10-17: 23:00:00 via INTRAVENOUS
  Administered 2011-10-18: 20 mL/h via INTRAVENOUS
  Filled 2011-10-17 (×4): qty 1000

## 2011-10-17 NOTE — Progress Notes (Signed)
Occupational Therapy Session Note  Patient Details  Name: Mark [name] MRN: [id number] Date of Birth: [birth year]  Today's Date: 10/17/2011 Time: 4098-1191 Time Calculation (min): 39 min   Skilled Therapeutic Interventions/Progress Updates:    Pt in bed with O2 to trach.  RN advised to not get pt out of bed secondary to AM episode of desaturating.  Pt engaged in bed mobility including rolling right and rolling left with mod/max assist.  When asked where he was patient did not respond.  When therapist verbalized [name] patient responded [facility] but could not state if he was at hospital or hotel.  Using head movement patient indicated his right knee was uncomfortable with movement.  Pt repositioned in bed and mitt on right hand replaced per RN request.   Therapy Documentation Precautions:  Precautions Precautions: Fall Required Braces or Orthoses: Cervical Brace Cervical Brace: Hard collar;Applied in supine position Restrictions Weight Bearing Restrictions: No General:   Pain: Pain Assessment Pain Assessment: Faces Faces Pain Scale: Hurts a little bit Pain Location: Knee Pain Orientation: Right Pain Descriptors: Discomfort Pain Onset: With Activity Pain Intervention(s):  (meds admin prior to therapy)  See FIM for current functional status  Therapy/Group: Individual Therapy  [name] 10/17/2011, 1:42 PM

## 2011-10-17 NOTE — Progress Notes (Signed)
Pt grimaced while attempting to push feeding through pt's PEG tube, when asked if his stomach hurt , pt said yes and nodded his head. Pt has positive bowel sounds, distended stomach, 0cc residual from PEG prior to using it , and positive placement. RN  Placed a suppository in rectum, pt only had small bowel movement this morning. Dr. Amador Cunas notified and placed feeding on hold,  wait to see if patient has a bowel movement and keep MD updated.

## 2011-10-17 NOTE — Progress Notes (Signed)
Pt seen by speech therapist, Rn went in to check patient. Patients sat dropped to 38% , inner cannula and passey muir valve removed after a few attempts,O2 sats went up to 99% once removed. Speech placed valve on pt and  After episode RN spoke to patients mother. Patients mother did apply passey muir valve after it fell off, education performed on calling staff for assist with valve, patients mother verbalized an understanding. Patients O2 sats remained between 99-100%, vitals remained stable.Resp therapist assessed pt and performed trach care.

## 2011-10-17 NOTE — Progress Notes (Addendum)
Speech Language Pathology Daily Session Note  Patient Details  Name: Mark [name] MRN: [id number] Date of Birth: 04-Apr-1966  Today's Date: 10/17/2011 Time: 0830-0900 Time Calculation (min): 30 min  Short Term Goals: Week 2:  SLP Short Term Goal 1 (Week 2): Pt will initiate trials during self-feeding task with Mod A visual and tactile cues. SLP Short Term Goal 2 (Week 2): Pt will focus attention during a functional task for 30 secs with Max A verbal cues.  SLP Short Term Goal 3 (Week 2): Pt will verbalize yes/no in reponse to a question 50% of the time.  SLP Short Term Goal 4 (Week 2): Pt will focus attention to left enviornment with Mod verbal and visual cues.   Skilled Therapeutic Interventions:  Treatment focused on facilitation of communication, cognition as well as expectoration ability with PMSV in place.  Patient with reflexive coughing upon entry with no PMSV in place.  Valve placed with occasional cough, however patient unable to follow the command to cough volitionally to successfully expectorate these audible thick secretions.  Patient's mother assisted in supine to sit on EOB with SLP, however this did not increase coughing.  Sit to supine did increase coughing with Spo2 in the 80's back up into the 90's or 100%. Noteworthy: patient's O2 monitor frequently signaled a low saturation (60's, 70's) however, clinically, patient presented the same as he did when he was 100% Spo2.  No increased RR or HR during those moments and reapplication of finger probe correlated with return to 100% saturation.  Made RN aware.  PO trials of pudding thick and thin water resulted in inconsistent swallows per laryngeal palpation and suspected silent aspiration in those trials.  Clinical observation of 1 cough response post-swallow with thin water.  Patient verbalized "put cheese on it" and "cheeseburger" following topic of food, however with no further clarification if this was a specific request or delayed  response.  Education with mother regarding his abilities today and ongoing progress.  FIM:  Comprehension Comprehension Mode: Auditory Comprehension: 2-Understands basic 25 - 49% of the time/requires cueing 51 - 75% of the time Expression Expression Mode: Nonverbal Expression Assistive Devices: 6-Talk trach valve Expression: 1-Expresses basis less than 25% of the time/requires cueing greater than 75% of the time. Social Interaction Social Interaction: 1-Interacts appropriately less than 25% of the time. May be withdrawn or combative. Problem Solving Problem Solving: 1-Solves basic less than 25% of the time - needs direction nearly all the time or does not effectively solve problems and may need a restraint for safety Memory Memory: 1-Recognizes or recalls less than 25% of the time/requires cueing greater than 75% of the time  Pain Pain Assessment Pain Assessment: No/denies pain  Therapy/Group: Individual Therapy  Myra Rude, M.S.,CCC-SLP Pager [phone] 10/17/2011, 1:25 PM
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
## 2011-10-18 ENCOUNTER — Inpatient Hospital Stay (HOSPITAL_COMMUNITY): Payer: BC Managed Care – PPO

## 2011-10-18 LAB — URINALYSIS, ROUTINE W REFLEX MICROSCOPIC
Glucose, UA: NEGATIVE mg/dL
Specific Gravity, Urine: 1.021 (ref 1.005–1.030)
Urobilinogen, UA: 1 mg/dL (ref 0.0–1.0)

## 2011-10-18 LAB — DIFFERENTIAL
Eosinophils Absolute: 0 10*3/uL (ref 0.0–0.7)
Eosinophils Relative: 0 % (ref 0–5)
Lymphs Abs: 1.9 10*3/uL (ref 0.7–4.0)
Monocytes Absolute: 0.5 10*3/uL (ref 0.1–1.0)
Monocytes Relative: 5 % (ref 3–12)

## 2011-10-18 LAB — GLUCOSE, CAPILLARY
Glucose-Capillary: 124 mg/dL — ABNORMAL HIGH (ref 70–99)
Glucose-Capillary: 139 mg/dL — ABNORMAL HIGH (ref 70–99)
Glucose-Capillary: 148 mg/dL — ABNORMAL HIGH (ref 70–99)

## 2011-10-18 LAB — CBC
MCH: 27.3 pg (ref 26.0–34.0)
MCV: 87.4 fL (ref 78.0–100.0)
Platelets: 226 10*3/uL (ref 150–400)
RBC: 4.29 MIL/uL (ref 4.22–5.81)

## 2011-10-18 MED ORDER — MOXIFLOXACIN HCL IN NACL 400 MG/250ML IV SOLN
400.0000 mg | INTRAVENOUS | Status: DC
  Administered 2011-10-18: 400 mg via INTRAVENOUS
  Filled 2011-10-18 (×2): qty 250
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
## 2011-10-18 NOTE — Progress Notes (Signed)
Temperature 101.3 pulse trending up 104 to 133. Doctor notified. Orders given. Will continue to monitor.

## 2011-10-18 NOTE — Progress Notes (Signed)
Pt grimaced when attempting to place meds in PEG. No residual and placement verified. When asked if stomach hurt, pt nodded yes. MD notified and new orders received. Tube feedings held per MD request. Iv started with D5 1/2 NS with 20 K. Pt suctioned with small amount of thick tan colored sputum . Tolerated well.  Will continue to monitor.  Mark [name]

## 2011-10-18 NOTE — Progress Notes (Signed)
Physical Therapy Note  Patient Details  Name: Mark [name] MRN: [id number] Date of Birth: [birth year] Today's Date: 10/18/2011  9:30-10:20 individual therapy  Pt unable to rate pain, nodded yes when asked. Pt was premedicated and repositioned.  Performed rolling both ways with +2 total assist. Pt would grasp rail for 2 seconds only with rt. Hand and turn head on command. Assisted pt with donning brief and shorts with total assist. Used maxi sky for OOB transfer. With CNA.    Julian Reil 10/18/2011, 10:49 AM

## 2011-10-19 ENCOUNTER — Inpatient Hospital Stay (HOSPITAL_COMMUNITY): Payer: BC Managed Care – PPO

## 2011-10-19 DIAGNOSIS — S069X9A Unspecified intracranial injury with loss of consciousness of unspecified duration, initial encounter: Secondary | ICD-10-CM

## 2011-10-19 DIAGNOSIS — S069XAA Unspecified intracranial injury with loss of consciousness status unknown, initial encounter: Secondary | ICD-10-CM

## 2011-10-19 DIAGNOSIS — K9420 Gastrostomy complication, unspecified: Secondary | ICD-10-CM

## 2011-10-19 DIAGNOSIS — Z5189 Encounter for other specified aftercare: Secondary | ICD-10-CM

## 2011-10-19 DIAGNOSIS — W320XXA Accidental handgun discharge, initial encounter: Secondary | ICD-10-CM

## 2011-10-19 LAB — CBC
HCT: 37.8 % — ABNORMAL LOW (ref 39.0–52.0)
Hemoglobin: 12 g/dL — ABNORMAL LOW (ref 13.0–17.0)
MCHC: 31.7 g/dL (ref 30.0–36.0)
MCV: 86.3 fL (ref 78.0–100.0)
RDW: 14.3 % (ref 11.5–15.5)

## 2011-10-19 LAB — DIFFERENTIAL
Basophils Absolute: 0 10*3/uL (ref 0.0–0.1)
Basophils Relative: 0 % (ref 0–1)
Eosinophils Relative: 1 % (ref 0–5)
Monocytes Absolute: 0.7 10*3/uL (ref 0.1–1.0)
Monocytes Relative: 6 % (ref 3–12)

## 2011-10-19 LAB — GLUCOSE, CAPILLARY
Glucose-Capillary: 130 mg/dL — ABNORMAL HIGH (ref 70–99)
Glucose-Capillary: 137 mg/dL — ABNORMAL HIGH (ref 70–99)
Glucose-Capillary: 149 mg/dL — ABNORMAL HIGH (ref 70–99)
Glucose-Capillary: 154 mg/dL — ABNORMAL HIGH (ref 70–99)

## 2011-10-19 MED ORDER — DEXTROSE-NACL 5-0.45 % IV SOLN
INTRAVENOUS | Status: DC
  Administered 2011-10-19: 21:00:00 via INTRAVENOUS

## 2011-10-19 MED ORDER — IOHEXOL 300 MG/ML  SOLN
50.0000 mL | Freq: Once | INTRAMUSCULAR | Status: AC | PRN
  Administered 2011-10-19: 10 mL via INTRAVENOUS

## 2011-10-19 MED ORDER — TRAMADOL HCL 50 MG PO TABS
50.0000 mg | ORAL_TABLET | Freq: Four times a day (QID) | ORAL | Status: DC | PRN
  Administered 2011-10-21 – 2011-11-03 (×13): 50 mg via ORAL
  Filled 2011-10-19 (×14): qty 1

## 2011-10-19 MED ORDER — WHITE PETROLATUM GEL
Status: AC
  Filled 2011-10-19: qty 5

## 2011-10-19 MED ORDER — GUAIFENESIN-DM 100-10 MG/5ML PO SYRP: 5.0000 mL | ORAL_SOLUTION | Freq: Four times a day (QID) | ORAL | Status: DC | PRN

## 2011-10-19 MED ORDER — PIPERACILLIN-TAZOBACTAM 3.375 G IVPB
3.3750 g | Freq: Three times a day (TID) | INTRAVENOUS | Status: DC
  Administered 2011-10-19 – 2011-10-27 (×23): 3.375 g via INTRAVENOUS
  Filled 2011-10-19 (×26): qty 50

## 2011-10-19 MED ORDER — ALUM & MAG HYDROXIDE-SIMETH 200-200-20 MG/5ML PO SUSP: 30.0000 mL | ORAL | Status: DC | PRN

## 2011-10-19 MED ORDER — POLYETHYLENE GLYCOL 3350 17 G PO PACK
17.0000 g | PACK | Freq: Every day | ORAL | Status: DC | PRN
  Filled 2011-10-19: qty 1

## 2011-10-19 MED ORDER — VANCOMYCIN HCL IN DEXTROSE 1-5 GM/200ML-% IV SOLN
1000.0000 mg | Freq: Three times a day (TID) | INTRAVENOUS | Status: DC
  Administered 2011-10-19 – 2011-10-23 (×11): 1000 mg via INTRAVENOUS
  Filled 2011-10-19 (×15): qty 200

## 2011-10-19 MED ORDER — METOPROLOL TARTRATE 25 MG/10 ML ORAL SUSPENSION
50.0000 mg | Freq: Two times a day (BID) | ORAL | Status: DC
  Filled 2011-10-19: qty 20

## 2011-10-19 MED ORDER — MORPHINE SULFATE 2 MG/ML IJ SOLN
4.0000 mg | Freq: Four times a day (QID) | INTRAMUSCULAR | Status: DC | PRN
  Administered 2011-10-19 – 2011-10-20 (×2): 4 mg via INTRAVENOUS
  Filled 2011-10-19 (×2): qty 2

## 2011-10-19 MED ORDER — ACETAMINOPHEN 325 MG PO TABS
325.0000 mg | ORAL_TABLET | ORAL | Status: DC | PRN
  Administered 2011-10-22 – 2011-11-17 (×9): 650 mg via ORAL
  Filled 2011-10-19 (×9): qty 2
  Filled 2011-10-19: qty 1

## 2011-10-19 MED ORDER — IOHEXOL 300 MG/ML  SOLN
80.0000 mL | Freq: Once | INTRAMUSCULAR | Status: AC | PRN
  Administered 2011-10-19: 80 mL via INTRAVENOUS

## 2011-10-19 MED ORDER — MORPHINE SULFATE 10 MG/ML IJ SOLN: 10.0000 mg | Freq: Four times a day (QID) | INTRAMUSCULAR | Status: DC | PRN

## 2011-10-19 MED ORDER — OXYCODONE HCL 5 MG PO TABS
5.0000 mg | ORAL_TABLET | ORAL | Status: DC | PRN
  Administered 2011-10-23: 5 mg via ORAL
  Administered 2011-10-24: 10 mg via ORAL
  Administered 2011-10-26 (×2): 5 mg via ORAL
  Administered 2011-10-28 – 2011-10-29 (×4): 10 mg via ORAL
  Administered 2011-11-03: 5 mg via ORAL
  Administered 2011-11-04 (×2): 10 mg via ORAL
  Administered 2011-11-06: 5 mg via ORAL
  Administered 2011-11-06 – 2011-11-12 (×3): 10 mg via ORAL
  Administered 2011-11-16: 5 mg via ORAL
  Filled 2011-10-19 (×2): qty 1
  Filled 2011-10-19 (×4): qty 2
  Filled 2011-10-19 (×2): qty 1
  Filled 2011-10-19: qty 2
  Filled 2011-10-19: qty 1
  Filled 2011-10-19 (×2): qty 2
  Filled 2011-10-19: qty 1
  Filled 2011-10-19 (×4): qty 2
  Filled 2011-10-19: qty 1
  Filled 2011-10-19 (×2): qty 2

## 2011-10-19 MED ORDER — PANTOPRAZOLE SODIUM 40 MG IV SOLR
40.0000 mg | Freq: Every day | INTRAVENOUS | Status: DC
  Administered 2011-10-19 – 2011-10-20 (×2): 40 mg via INTRAVENOUS
  Filled 2011-10-19 (×3): qty 40

## 2011-10-19 MED ORDER — ACETAMINOPHEN 650 MG RE SUPP
650.0000 mg | RECTAL | Status: AC | PRN
  Administered 2011-10-19 – 2011-10-20 (×2): 650 mg via RECTAL
  Filled 2011-10-19 (×2): qty 1

## 2011-10-19 NOTE — Progress Notes (Signed)
Occupational Therapy Weekly Progress Note  Patient Details  Name: Mark [name] MRN: [id number] Date of Birth: [birth year]  Today's Date: 10/19/2011  Patient has met 4 of 4 short term goals. Pt's current behavior is consistent with Rancho Level III.  Pt requires Max-Total A for initiation, purposeful behavior, focused attention, orientation, intellectual awareness and functional communication. Pt is tolerating PMSV during all waking hours without distress and has begun verbalized at the basic word level and today 4/22 trach was plugged. Pt still requires total A +2 for basic bathing and dressing. Bathing continues to be completed by nursing. Pt has been transferring via slide board with +2 with focus on forward weight shift and a 3rd person holding the w/c. Pt continues to present with tone in right knee and decreased flexion.  Patient continues to demonstrate the following deficits: muscle weakness and muscle joint tightness, decreased cardiorespiratoy endurance, impaired timing and sequencing, abnormal tone, unbalanced muscle activation, decreased coordination and decreased motor planning, decreased midline orientation and decreased motor planning, decreased initiation, decreased attention, decreased awareness, decreased problem solving, decreased safety awareness, decreased memory and delayed processing and decreased sitting balance, decreased standing balance, decreased postural control, hemiplegia and decreased balance strategies and therefore will continue to benefit from skilled OT intervention to enhance overall performance with BADL.  Patient progressing toward long term goals..  Continue plan of care.  OT Short Term Goals Week 1:  OT Short Term Goal 1 (Week 1): Pt will tolerate sitting EOB for 15 min with mod A in prep for functional activity OT Short Term Goal 1 - Progress (Week 1): Met OT Short Term Goal 2 (Week 1): Pt will demonstrated focused attention to take familar grooming item from  therapist in prep for performing tasks OT Short Term Goal 2 - Progress (Week 1): Met OT Short Term Goal 3 (Week 1): Pt will roll to left in bed for clothing management/hygiene with total A of one person. OT Short Term Goal 3 - Progress (Week 1): Met OT Short Term Goal 4 (Week 1): Pt will answer yes/ no questions with 80% accuracy for familar ADL/ orientation tasks OT Short Term Goal 4 - Progress (Week 1): Met Week 2:  OT Short Term Goal 1 (Week 2): Pt will follow one step commands with extra time with mod tactile cuing OT Short Term Goal 2 (Week 2): Pt will tolerate sitting EOB for 10 min A in prep for UB dressing OT Short Term Goal 3 (Week 2): Pt will dress UB with Mod A OT Short Term Goal 4 (Week 2): Pt will engage in a grooming task with right hand with mod A and initiation cues.  Skilled Therapeutic Interventions/Progress Updates:  Biomedical scientist reintegration;Neuromuscular re-education;Psychosocial support;Skin care/wound managment;Therapeutic Exercise;UE/LE Coordination activities;UE/LE Strength taining/ROM;Therapeutic Activities;Self Care/advanced ADL retraining;Pain management;Patient/family education;Functional mobility training;DME/adaptive equipment instruction;Discharge planning;Cognitive remediation/compensation;Splinting/orthotics   Therapy Documentation Precautions:  Precautions Precautions: Fall Required Braces or Orthoses: Cervical Brace Cervical Brace: Hard collar;Applied in supine position Restrictions Weight Bearing Restrictions: No General: General Amount of Missed OT Time (min): 60 Minutes Missed Time Reason: CT/MRI, bed rest Vital Signs: Therapy Vitals Pulse Rate: 122  Resp: 22  Oxygen Therapy SpO2: 94 % O2 Device: None (Room air) ADL: ADL Eating: NPO Grooming: Dependent Upper Body Bathing: Dependent Lower Body Bathing: Dependent Upper Body Dressing: Dependent Lower Body Dressing: Dependent Toileting: Dependent  See FIM for  current functional status  Therapy/Group: Individual Therapy  [name] [facility] 10/19/2011, 2:30 PM
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
## 2011-10-19 NOTE — Progress Notes (Signed)
Called to room by another RT for inability to pass suction catheter.  Upon arrival placed Co2 detector on trach with no color change.  Assessed Trach and trach was still in stoma but out of airway.  VS were stable, pt was not in distress, Spo2 94%.  Placed obturator in trach and placed trach back in airway with minimal resistance.  Co2 detector + for color change.  BBS equal.  PA at bedside and asked RT to cap trach for 24 hours and then re-assess.  Removed inner cannula and capped trach.  Pt is currently capped on RA and tolerating very well.  Will continue to monitor.  Emergency equipment at bedside.

## 2011-10-19 NOTE — Progress Notes (Signed)
Nutrition Follow-up  Per chart review, pt experienced pain/burning with TF bolus on 4/20. KUB showed no obstruction.  BM this morning. RN reports TF currently on hold 2/2 possible infection at PEG site. Plan for CT later today, per RN.  Diet Order:  NPO TF: Bolus of Jevity 1.2, 420 ml, 5 times daily. 30 ml Prostat via tube BID. This provides: 2664 kcal, 148 grams protein; meeting 100% of estimated protein and energy needs.  Meds: Scheduled Meds:   . chlorhexidine  15 mL Mouth/Throat QID  . citalopram  20 mg Per Tube QHS  . clonazePAM  0.5 mg Per Tube QHS  . dantrolene  25 mg Oral TID  . feeding supplement (JEVITY 1.2 CAL)  420 mL Per Tube 5 X Daily  . feeding supplement  30 mL Per Tube BID  . ferrous sulfate  300 mg Per Tube BID WC  . free water  200 mL Per Tube QID  . insulin aspart  0-9 Units Subcutaneous Q4H  . methylphenidate  15 mg Per Tube BID WC  . metoprolol tartrate  50 mg Per Tube BID  . moxifloxacin  400 mg Intravenous Q24H  . pantoprazole sodium  40 mg Per Tube Q1200  . traZODone  100 mg Oral QHS  . DISCONTD: metoprolol tartrate  50 mg Per Tube BID   Continuous Infusions:   . dextrose 5 % and 0.45 % NaCl with KCl 20 mEq/L 20 mL/hr (10/18/11 1100)   PRN Meds:.acetaminophen, alum & mag hydroxide-simeth, bisacodyl, guaiFENesin-dextromethorphan, hydrocortisone cream, oxyCODONE, polyethylene glycol, prochlorperazine, prochlorperazine, prochlorperazine, traMADol  Labs:  CMP     Component Value Date/Time   NA 142 10/17/2011 2144   K 4.5 10/17/2011 2144   CL 104 10/17/2011 2144   CO2 24 10/17/2011 2144   GLUCOSE 138* 10/17/2011 2144   BUN 18 10/17/2011 2144   CREATININE 0.66 10/17/2011 2144   CALCIUM 10.2 10/17/2011 2144   PROT 7.2 10/09/2011 0735   ALBUMIN 2.9* 10/09/2011 0735   AST 16 10/09/2011 0735   ALT 22 10/09/2011 0735   ALKPHOS 94 10/09/2011 0735   BILITOT 0.3 10/09/2011 0735   GFRNONAA >90 10/17/2011 2144   GFRAA >90 10/17/2011 2144   CBG (last 3)   Basename  10/19/11 0902 10/19/11 0358 10/19/11 0002  GLUCAP 165* 154* 149*     Intake/Output Summary (Last 24 hours) at 10/19/11 1110 Last data filed at 10/19/11 1000  Gross per 24 hour  Intake      0 ml  Output   1900 ml  Net  -1900 ml   Weight Status:  113.8 kg - wt down 1.6 kg x 6 days  Estimated needs:  2500 - 2800 kcal, 145 - 160 grams protein  Nutrition Dx:  Inadequate oral intake r/t inability to eat AEB NPO status. Ongoing.  Goal:  Pt to tolerate TF. TF to meet >90% of estimated needs. Met.  Intervention:  Continue current TF regimen, as able. Will continue to monitor for tolerance and weight maintenance.  Monitor:  TF tolerance, weights, labs, I/O's, initiation of PO's  Adair Laundry Pager #:  [phone]

## 2011-10-19 NOTE — Procedures (Signed)
Gastrostomy site inject - No communication from skin to stomach lumen. A large amount of soft tissue swelling at the site was noted prior to injection worrisome for hematoma.

## 2011-10-19 NOTE — Progress Notes (Signed)
Case was reviewed with Dr. Bonnielee Haff from interventional radiology.  He plans to replace the tube tonight under fluoro.  Plan D/W patient's mother. Patient examined and I agree with the assessment and plan  Violeta Gelinas, MD, MPH, FACS Pager: [phone]  10/19/2011 6:13 PM

## 2011-10-19 NOTE — Progress Notes (Signed)
Speech Language Pathology Note  Patient Details  Name: Mark [name] MRN: [id number] Date of Birth: 1965-11-20 Today's Date: 10/19/2011  Treatment scheduled for 60 minutes cancelled due to patient off floor for CT scan.   Myra Rude, M.S.,CCC-SLP Pager [phone] 10/19/2011, 1:10 PM

## 2011-10-19 NOTE — Progress Notes (Addendum)
Late entry . Patient given meds via peg this am no residuals noted prior to giving meds. Patient grimacing when meds administered . P. Love, PA notified. Dr. Wynn Banker notified and also reported patient's heart rate between 120- 140's . . and orders received. Patient tolerating trach plugged throughout day. o2 sats between 94-96 % room air . Temp noted elevated 101.8 at 1200 . P. Love PA notified no orders received . Awaiting CT scan for confirmation of peg placement . Temp rechecked for 101.3 at 1641 requested meds to be changed to IV or PR from PA  . Patient taken to radiology for replacement of peg . Patient received from radiology  at 1845 no peg noted no report received from radiology at 1900. Tylenol suppository given after patient returned from radiology via August Saucer . Report given to next shift RN . Patient requesting quietly heart rate 112 at 1915. Continue with plan of care.                                                                                                                Cleotilde Neer

## 2011-10-19 NOTE — Progress Notes (Signed)
Patient ID: Mark [name], male   DOB: [birth year], 46 y.o.   MRN: [id number]  Asked to see pt as PEG tube was dislodged sometime over weekend and CT shows tube in abd wall with inflammation about the area. Original tube placement 09/24/11. Attempted to replace tube with Foley but in would not slide into the tract . Minimal drainage about area.   Will ask IR to see if they can  replace tube over wire. Agree with antibiotic Rx with Zosyn and Peggye Form Pager [phone] General Trauma Pager [phone]

## 2011-10-19 NOTE — Progress Notes (Signed)
Physical Therapy Note  Patient Details  Name: Mark [name] MRN: [id number] Date of Birth: Nov 09, 1965 Today's Date: 10/19/2011  Time: 1300 Pt unable to be seen for PT therapy this session due to being off the unit for CT scan.   Zeriah Baysinger 10/19/2011, 1:06 PM
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
## 2011-10-19 NOTE — Progress Notes (Signed)
Physical Therapy Note  Patient Details  Name: Mark [name] MRN: [id number] Date of Birth: September 23, 1965 Today's Date: 10/19/2011  Pt missed 60 minutes of PT d/t resting HR 133-134 and PA requested morning therapy be held while this is monitored.   Michaelene Song 10/19/2011, 9:56 AM

## 2011-10-19 NOTE — Progress Notes (Signed)
Occupational Therapy Session Note  Patient Details  Name: Mark [name] MRN: [id number] Date of Birth: September 24, 1965  Today's Date: 10/19/2011 Time: 0815-0910 Time Calculation (min): 55 min  Short Term Goals: Week 1:  OT Short Term Goal 1 (Week 1): Pt will tolerate sitting EOB for 15 min with mod A in prep for functional activity OT Short Term Goal 2 (Week 1): Pt will demonstrated focused attention to take familar grooming item from therapist in prep for performing tasks OT Short Term Goal 3 (Week 1): Pt will roll to left in bed for clothing management/hygiene with total A of one person. OT Short Term Goal 4 (Week 1): Pt will answer yes/ no questions with 80% accuracy for familar ADL/ orientation tasks  Skilled Therapeutic Interventions/Progress Updates:    1:1 when arrived RT adjusting trach, deep suctioning and capped trach. Pt with elevated HR in 130s, warm to touch and beads of sweat on head. Pt with different demeanor and visually tired. Focused session on following one step commands, answering yes/no questions, focused to sustained attention, use of right and left hands for simple grooming tasks. Transitioned to EOB where HR increased to 140s (142-143) with O2 remaining at 93%. Pt needed max cuing and tactile cuing for following commands and engaging with friend who visited. Secondary to HR elevation, returned to supine.  Therapy Documentation Precautions:  Precautions Precautions: Fall Required Braces or Orthoses: Cervical Brace Cervical Brace: Hard collar;Applied in supine position Restrictions Weight Bearing Restrictions: No General: General Missed Time Reason: MD hold (comment) (RHR 133) Pain:  with transitional movements pt with c/o back pain. RN present and notified   See FIM for current functional status  Therapy/Group: Individual Therapy  [name] [facility] 10/19/2011, 10:48 AM

## 2011-10-20 ENCOUNTER — Inpatient Hospital Stay (HOSPITAL_COMMUNITY): Payer: BC Managed Care – PPO

## 2011-10-20 LAB — DIFFERENTIAL
Basophils Absolute: 0 10*3/uL (ref 0.0–0.1)
Basophils Relative: 0 % (ref 0–1)
Eosinophils Absolute: 0.3 10*3/uL (ref 0.0–0.7)
Eosinophils Relative: 3 % (ref 0–5)
Lymphocytes Relative: 21 % (ref 12–46)
Monocytes Absolute: 0.7 10*3/uL (ref 0.1–1.0)

## 2011-10-20 LAB — GLUCOSE, CAPILLARY: Glucose-Capillary: 136 mg/dL — ABNORMAL HIGH (ref 70–99)

## 2011-10-20 LAB — BASIC METABOLIC PANEL
Calcium: 9.8 mg/dL (ref 8.4–10.5)
Creatinine, Ser: 0.65 mg/dL (ref 0.50–1.35)
GFR calc Af Amer: >90 mL/min (ref >90)
GFR calc non Af Amer: >90 mL/min (ref >90)
Sodium: 142 mEq/L (ref 135–145)

## 2011-10-20 LAB — CBC
MCH: 27.4 pg (ref 26.0–34.0)
MCHC: 31.6 g/dL (ref 30.0–36.0)
MCV: 86.5 fL (ref 78.0–100.0)
Platelets: 204 10*3/uL (ref 150–400)
RDW: 14.4 % (ref 11.5–15.5)
WBC: 9.4 10*3/uL (ref 4.0–10.5)

## 2011-10-20 MED ORDER — METOPROLOL TARTRATE 1 MG/ML IV SOLN
5.0000 mg | Freq: Four times a day (QID) | INTRAVENOUS | Status: DC
  Administered 2011-10-20 – 2011-10-21 (×6): 5 mg via INTRAVENOUS
  Filled 2011-10-20 (×10): qty 5
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
## 2011-10-20 NOTE — Procedures (Signed)
Fiberoptic Endoscopic Evaluation of Swallowing  Patient Details  Name: Ruth Tully MRN: [id number] Date of Birth: [birth year]  Today's Date: 10/20/2011 Time: 1330-1400 Time Calculation (min): 30 min  Past Medical History: No past medical history on file. Past Surgical History: No past surgical history on file. HPI:  See Cognitive-Linguistic evaluation on 10/09/11    Recommendation/Prognosis  Clinical Impression Dysphagia Diagnosis: Within Functional Limits Clinical impression: Demonstrates a functional oral-pharyngeal swallow with all tested consistencies and no observed laryngeal penetration or aspiration.  No dysphagia treatment goals at this time.  Will continue with cognitive based goals, which may be included within a meal setting.  Swallow Evaluation Recommendations Diet Recommendations: Regular;Thin liquid Liquid Administration via: Cup;Straw Medication Administration: Crushed with puree (Per mother's request) Supervision: Patient able to self feed;Full supervision/cueing for compensatory strategies Compensations: Slow rate;Small sips/bites Postural Changes and/or Swallow Maneuvers: Seated upright 90 degrees;Upright 30-60 min after meal Oral Care Recommendations: Oral care BID    Myra Rude, M.S.,CCC-SLP Pager [phone] 10/20/2011, 3:34 PM

## 2011-10-20 NOTE — Progress Notes (Signed)
Occupational Therapy Session Note  Patient Details  Name: Mark [name] MRN: [id number] Date of Birth: 07-23-1965  Today's Date: 10/20/2011 Time: 2440-1027 Time Calculation (min): 60 min  Short Term Goals: Week 2:  OT Short Term Goal 1 (Week 2): Pt will follow one step commands with extra time with mod tactile cuing OT Short Term Goal 2 (Week 2): Pt will tolerate sitting EOB for 10 min A in prep for UB dressing OT Short Term Goal 3 (Week 2): Pt will dress UB with Mod A OT Short Term Goal 4 (Week 2): Pt will engage in a grooming task with right hand with mod A and initiation cues.  Skilled Therapeutic Interventions/Progress Updates:    1:1 self care retraining : focus on initiation with a functional one step command with tactile cues and extra time, motorial follow through with UE once initiate movement, sitting EOB for 5 min with min A with brief moments of supervision (requires total A to get in a position that pt can support himself, grooming with hand over hand support, orientation questions slide board transfer with +2 to transition into w/c. Initially asked basic biographic information waiting for a verbal response but none given. Asked questions with a field of three options and pt answered 100% questions accurately pointing with right index finger (where he is, month, birthday, year, name of hospital) , and able to right his name with his right hand on given line. At this time pt unaware of why in the hospital. While sitting in w/c with leg rest promoting knee flexion, pt able to relax and achieve increased knee flexion to approx. 60 degrees  Therapy Documentation Precautions:  Precautions Precautions: Fall Required Braces or Orthoses: Cervical Brace Cervical Brace: Hard collar;Applied in supine position Restrictions Weight Bearing Restrictions: No Pain:  grimaces with rolling and transitioning to EOB (abdominal pain) Rn aware and gave meds  See FIM for current functional  status  Therapy/Group: Individual Therapy  [name] 10/20/2011, 12:09 PM

## 2011-10-20 NOTE — Progress Notes (Signed)
Physical Therapy Note  Patient Details  Name: Mark [name] MRN: [id number] Date of Birth: 07/01/1965 Today's Date: 10/20/2011  Session 1 0930-1000 30 minutes  Pt denied pain.  Focus of treatment on trunk control.  Initiated forward/backward trunk movement sitting in w/c with +2 manual facilitation, pt participating <25%.  Hand over hand assist for raising Swiss ball overhead for thoracic extension and awareness of L side with patient participating <10%.  HR 110-120 throughout in sitting.  Pt nodding/shaking head in response to yes/no questions but no verbalizations with cuing.  Pt c/o fatigue, minimally participatory throughout.  Individual therapy  Session 2 1100-1155 55 minutes  Pt facial grimacing with R knee flexion; pain relieved by repositioning.  Pt in [name] frame x20 minutes with min-max A at trunk to correct L lateral and forward trunk lean.  Pt attending to writing letters/simple shapes on paper with mod verbal cuing/redirection and setup x 5 minutes.  Sorting cards task with max cuing, 20% accuracy x2 min.  Pt demonstrates very poor selective attention in busy environment, easily distractible by extraneous visual stimuli.  HR in 130s throughout [name].  Pt returned to bed with Maxi move lift, pt <10% assist for grasping rails for rolling L and R in bed.  Individual therapy  Session 3 Time: 1300-1330 30 minutes  No c/o pain.  Pt <10% participating for rolling to R, sup>sit transfer.  Pt sat EOB for 15 minutes with supervision-min A with increased trunk sway noted at end of session, still attempting to push occasionally with R UE and requiring redirection.  Sorting activity with cards with sustained attention up to 1 minute without visual distraction, required max verbal/visual cuing to continue.  Noted L side bending and rotation of head throughout, unable to correct with manual cuing.  Transferred to w/c with Maxi Move.  HR 120 supine initially, decreased to 105 in sitting  with administration of meds by RN.  Pt seems to perform better and has increased participation after supine rest breaks.  Individual therapy   Lockie Pares 10/20/2011, 10:06 AM

## 2011-10-20 NOTE — Progress Notes (Signed)
Per RT Note written 10/19/2011 at 8:28 am - PA at bedside and asked RT to cap trach for 24 hours and then re-assess.    00:44  Patient remains capped and is tolerating well on a 1 Liter nasal cannula and is due to be reassessed this AM.   RT will continue to monitor.  Gypsy Decant, RRT, RCP

## 2011-10-20 NOTE — Progress Notes (Signed)
Speech Language Pathology Daily Session Note  Patient Details  Name: [name] MRN: [id number] Date of Birth: [birth year]  Today's Date: 10/20/2011 Time: 1400-1430 Time Calculation (min): 30 min  Short Term Goals: SLP Short Term Goal 1 (Week 2): Pt will initiate trials during self-feeding task with Mod A visual and tactile cues. SLP Short Term Goal 2 (Week 2): Pt will focus attention during a functional task for 30 secs with Max A verbal cues.  SLP Short Term Goal 3 (Week 2): Pt will verbalize yes/no in reponse to a question 50% of the time.  SLP Short Term Goal 4 (Week 2): Pt will focus attention to left enviornment with Mod verbal and visual cues.   Skilled Therapeutic Interventions: Treatment focus on pt/family education in regards to current swallowing function, current diet recommendations and compensatory strategies/aspiration precautions. [name] verbalized understanding. Pt will continue to need reinforcement and encouragement for PO intake.    Daily Session FIM:  Comprehension Comprehension Mode: Auditory Comprehension: 2-Understands basic 25 - 49% of the time/requires cueing 51 - 75% of the time Expression Expression Mode: Verbal Expression: 1-Expresses basis less than 25% of the time/requires cueing greater than 75% of the time. Social Interaction Social Interaction: 1-Interacts appropriately less than 25% of the time. May be withdrawn or combative. Problem Solving Problem Solving: 1-Solves basic less than 25% of the time - needs direction nearly all the time or does not effectively solve problems and may need a restraint for safety Memory Memory: 1-Recognizes or recalls less than 25% of the time/requires cueing greater than 75% of the time Pain Pain Assessment Pain Assessment: No/denies pain  Therapy/Group: Individual Therapy  Katherinne Mofield 10/20/2011, 4:19 PM

## 2011-10-20 NOTE — Progress Notes (Signed)
Patient tolerated regular consistency food . Appetite good . Patient did require minimal cues to slow eating and stay focused . No coughing noted . Patient up in chair for meal. o2 sat at 95% room air trach plugged.  Continue with plan of care.                                                                                                                   Mark [name]

## 2011-10-20 NOTE — Progress Notes (Signed)
Temp. recheck @ 2315 98.8 axillary. Patient received tylenol suppository for pain. Patient is tolerating 1 L [place] well, with trach being capped.  O2 sats between 96-99%, HR 110-112 though out the night. Patient resting well.  Family at bedside.  Received ordered from P. Love to change metoprolol to IV form, and CBG to q6h.  Continue with plan of care.

## 2011-10-20 NOTE — Progress Notes (Signed)
Delle Reining, PA notified of radiology results of abd due to PEG tube not replaced:  A large amt of soft tissue swelling at the site was noted prior to injection worrisome for hematoma. Orders received:  D5 1/2 NS at 100 ml/hr; CBC with diff and BMET in AM.

## 2011-10-21 LAB — GLUCOSE, CAPILLARY
Glucose-Capillary: 122 mg/dL — ABNORMAL HIGH (ref 70–99)
Glucose-Capillary: 109 mg/dL — ABNORMAL HIGH (ref 70–99)
Glucose-Capillary: 125 mg/dL — ABNORMAL HIGH (ref 70–99)

## 2011-10-21 MED ORDER — MEGESTROL ACETATE 40 MG/ML PO SUSP
400.0000 mg | Freq: Every day | ORAL | Status: DC
  Administered 2011-10-21: 400 mg via ORAL
  Filled 2011-10-21 (×3): qty 10

## 2011-10-21 MED ORDER — METHYLPHENIDATE HCL 5 MG PO TABS: 15.0000 mg | ORAL_TABLET | Freq: Two times a day (BID) | ORAL | Status: DC

## 2011-10-21 MED ORDER — METHYLPHENIDATE HCL 5 MG PO TABS
5.0000 mg | ORAL_TABLET | Freq: Two times a day (BID) | ORAL | Status: DC
  Administered 2011-10-22 – 2011-11-03 (×24): 5 mg
  Filled 2011-10-21 (×24): qty 1

## 2011-10-21 MED ORDER — PANTOPRAZOLE SODIUM 40 MG PO TBEC
40.0000 mg | DELAYED_RELEASE_TABLET | Freq: Every day | ORAL | Status: DC
  Administered 2011-10-21 – 2011-11-19 (×29): 40 mg via ORAL
  Filled 2011-10-21 (×30): qty 1

## 2011-10-21 MED ORDER — METOPROLOL TARTRATE 1 MG/ML IV SOLN
5.0000 mg | Freq: Four times a day (QID) | INTRAVENOUS | Status: DC
  Filled 2011-10-21 (×2): qty 5

## 2011-10-21 MED ORDER — HYDROCERIN EX CREA
TOPICAL_CREAM | Freq: Two times a day (BID) | CUTANEOUS | Status: DC
  Administered 2011-10-21 – 2011-11-19 (×55): via TOPICAL
  Filled 2011-10-21 (×4): qty 113

## 2011-10-21 MED ORDER — METOPROLOL TARTRATE 25 MG/10 ML ORAL SUSPENSION
50.0000 mg | Freq: Two times a day (BID) | ORAL | Status: DC
Start: 2011-10-21 — End: 2011-11-03
  Administered 2011-10-21 – 2011-11-03 (×26): 50 mg
  Filled 2011-10-21 (×31): qty 20

## 2011-10-21 MED ORDER — DANTROLENE SODIUM 25 MG PO CAPS
25.0000 mg | ORAL_CAPSULE | Freq: Two times a day (BID) | ORAL | Status: DC
  Administered 2011-10-21 – 2011-10-22 (×4): 25 mg via ORAL
  Filled 2011-10-21 (×7): qty 1

## 2011-10-21 MED ORDER — METOPROLOL TARTRATE 25 MG/10 ML ORAL SUSPENSION
50.0000 mg | Freq: Two times a day (BID) | ORAL | Status: DC
  Filled 2011-10-21: qty 20

## 2011-10-21 NOTE — Progress Notes (Signed)
Patient trach removed today- Patient sats have stayed in mid 90s on room air. Patient PEG site dressing changed twice today- moderate amount of tan tube feeding color secretions on old dressing. Patient foley intact- Patient found pulling on tube this evening some urine drainage noted. Glove put on patient and foley still intact. After patient put back to bed after dinner more urine drainage noted on clothing. Next nurse made aware to possible need for foley change. Patient did not have bowel movement today Night nurse made aware to give suppository. Patient vitals stable. Patient appetite good- Patient up in chair for all meals. Maxi lift transfer with nursing staff.

## 2011-10-21 NOTE — Progress Notes (Signed)
Speech Language Pathology Daily Session Note  Patient Details  Name: Mark [name] MRN: [id number] Date of Birth: 07-May-1966  Today's Date: 10/21/2011 Time: 1330-1430 Time Calculation (min): 60 min  Short Term Goals: Week 2: SLP Short Term Goal 1 (Week 2): Pt will initiate trials during self-feeding task with Mod A visual and tactile cues. SLP Short Term Goal 2 (Week 2): Pt will focus attention during a functional task for 30 secs with Max A verbal cues.  SLP Short Term Goal 3 (Week 2): Pt will verbalize yes/no in reponse to a question 50% of the time.  SLP Short Term Goal 4 (Week 2): Pt will focus attention to left enviornment with Mod verbal and visual cues.   Skilled Therapeutic Interventions: Treatment focus on initiation, selective attention and problem solving. Pt required Min verbal and visual cues to initiate bites towards beginning of the meal which faded to Mod I for initiation between textures and liquids. Pt without overt s/s of aspiration but required Max verbal and visual cues to self-monitor and correct bite size. Pt total A for follow basic 1 step commands in regards to money management (give me the quarters) but independently counted the change with 100% accuracy. Pt with minimal verbal expression (only named and counted coins) and gestured "no" in regards to all questions, however, "no" was not always accurate. Pt required Mod A verbal cues to redirect attention to meal/task in moderately distracting environment.   Daily Session FIM:  Comprehension Comprehension Mode: Auditory Comprehension: 2-Understands basic 25 - 49% of the time/requires cueing 51 - 75% of the time Expression Expression Mode: Verbal Expression: 1-Expresses basis less than 25% of the time/requires cueing greater than 75% of the time. Social Interaction Social Interaction: 1-Interacts appropriately less than 25% of the time. May be withdrawn or combative. Problem Solving Problem Solving: 1-Solves basic  less than 25% of the time - needs direction nearly all the time or does not effectively solve problems and may need a restraint for safety Memory Memory: 1-Recognizes or recalls less than 25% of the time/requires cueing greater than 75% of the time Pain Grimacing with coughing, pt reported pain near PEG site, RN notified   Therapy/Group: Individual Therapy  Tzion Wedel 10/21/2011, 3:56 PM

## 2011-10-21 NOTE — Procedures (Signed)
Tracheostomy Change Note  Patient Details:   Name: Flay Ghosh DOB: [birth year] MRN: [id number]    Airway Documentation:     Evaluation  O2 sats: stable throughout Complications: No apparent complications Patient did tolerate procedure well. Bilateral Breath Sounds: Clear Suctioning: Airway Pt decannulated per MD order. Dry gauze dressing applied and secured over stoma with paper tape. RT will continue to monitor.   Ok Anis, MA 10/21/2011, 11:44 AM

## 2011-10-21 NOTE — Progress Notes (Signed)
I have written a separate not.  Patient doing well. This patient has been seen and I agree with the findings and treatment plan.  Marta Lamas. Gae Bon, MD, FACS [phone] (pager) [phone] (direct pager) Trauma Surgeon
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
## 2011-10-21 NOTE — Progress Notes (Signed)
Physical Therapy Note  Patient Details  Name: Mark [name] MRN: [id number] Date of Birth: 02/18/1966 Today's Date: 10/21/2011  Session 1   Time: 1000-1100 60 minutes   Pt c/o pain in bottom; per RN pt was premedicated.  Attempted to stand patient in standing frame and pt resisting with R LE.  Instead focused on forward trunk flexion and rotation to L in sitting to promote participation in transfers and bending R knee to break extensor tone.  Pt able to place objects with R hand with extra time and verbal cuing, but no hand over hand assist for initiation.  Sliding board transfer with +2 total A, pt 10% participation, encouraging forward flexion with R hand placement on chair.  Educated family on importance of quiet environment for rest.  Individual therapy  Session 2  Time: 1300-1330 30 minutes  Pt demonstrated facial grimacing with R knee flexion and forward trunk flexion; relieved by repositioning.  Rolling to R with +2 total A, pt <10% participation.  Pt 10% assist for sup>sit transfer, +2 total assist.  Sliding board transfer with +2 total A, additional time to regular w/c.  Pt nodded "yes" to say that he was scared of leaning forward during transfer.  Educated patient on need for forward lean for safety; pt continued to push trunk backwards during transfer.  Individual therapy  Session 3  Time: 1430-1530 60 minutes  Pt demonstrated facial grimacing with R knee flexion; relieved by repositioning.  Pt propelled manual w/c with hand over hand assist on L, max A for steering over level tile surface x80 feet.  Pt refused standing in Sara+ walker; agreeable to leaning forward and reaching for objects to encourage forward trunk flexion x6 attempts.  Attempted to reduce extensor tone in R LE with thigh supported and lower leg unsupported; unable to allow R leg to relax >60 degrees flexion.  Pt sliding from manual w/c; returned to bed in [facility] lift.  Pt left on L side to relieve pressure  on bottom with brief removed for comfort and relief.  Pt is able to propel self in manual w/c with mod A with therapy, but due to safety concerns is safer in Tilt-in-space w/c for sitting upright at other times.  Pt more participatory overall today but continues to resist forward trunk flexion to assist with transfers, posture.  Individual therapy   Lockie Pares 10/21/2011, 12:33 PM
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
## 2011-10-21 NOTE — Progress Notes (Signed)
Occupational Therapy Session Note  Patient Details  Name: Mark [name] MRN: [id number] Date of Birth: 1965/11/11  Today's Date: 10/21/2011 Time: 0830-0930 Time Calculation (min): 60 min  Short Term Goals: Week 2:  OT Short Term Goal 1 (Week 2): Pt will follow one step commands with extra time with mod tactile cuing OT Short Term Goal 2 (Week 2): Pt will tolerate sitting EOB for 10 min A in prep for UB dressing OT Short Term Goal 3 (Week 2): Pt will dress UB with Mod A OT Short Term Goal 4 (Week 2): Pt will engage in a grooming task with right hand with mod A and initiation cues.  Skilled Therapeutic Interventions/Progress Updates:    1:1 self care retraining; LB dressing rolling in bed (resistant to rolling to left- abdominal issues with old PEG), transitioned supine to sit with total A +2 but once sitting able to maintain sitting balance with supervision for 5-7 minutes, slide board transfer with +2 plus someone holding the chair. Pt with extreme difficulty with forward weight shift resistance to A to lean forward. Engaged in self feeding with mod to max tactile cues for initiation to continue to self feed throughout meal. Pt ate about 75% of meal. Pt answering most yes/no questions with "no" today but when presented in a different manner pt participatory.   Therapy Documentation Precautions:  Precautions Precautions: Fall Required Braces or Orthoses: Cervical Brace Cervical Brace: Hard collar;Applied in supine position Restrictions Weight Bearing Restrictions: No Pain:  no c/o or grimaces  See FIM for current functional status  Therapy/Group: Individual Therapy  [name] [facility] 10/21/2011, 3:21 PM
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
## 2011-10-21 NOTE — Care Management Note (Signed)
Patient ID: Mark [name], male   DOB: [birth year], 46 y.o.   MRN: [id number] Tuesday met w/ mother& pt to give team conference report:  ELOS 5 weeks.  Feeding tube is out-- he will begin regular diet & liquids--intake will be monitored to see if he is eating enough to meet his nutritional needs.  Should d/c trach soon--he is tolerating having it plugged all the time.  Txs report slow, but daily improvement.  Pt w/out change of expression during conversation, though he maintained steady eye contact & used right hand to wave good bye.

## 2011-10-22 LAB — GLUCOSE, CAPILLARY
Glucose-Capillary: 134 mg/dL — ABNORMAL HIGH (ref 70–99)
Glucose-Capillary: 163 mg/dL — ABNORMAL HIGH (ref 70–99)

## 2011-10-22 LAB — URINE CULTURE: Colony Count: >=100000

## 2011-10-22 MED ORDER — BOOST / RESOURCE BREEZE PO LIQD
1.0000 | Freq: Two times a day (BID) | ORAL | Status: DC
  Administered 2011-10-22 – 2011-11-19 (×50): 1 via ORAL

## 2011-10-22 MED ORDER — CITALOPRAM HYDROBROMIDE 10 MG/5ML PO SOLN
20.0000 mg | Freq: Every day | ORAL | Status: DC
  Filled 2011-10-22: qty 10

## 2011-10-22 MED ORDER — PRO-STAT SUGAR FREE PO LIQD
30.0000 mL | Freq: Two times a day (BID) | ORAL | Status: DC
  Administered 2011-10-22 – 2011-11-19 (×54): 30 mL via ORAL
  Filled 2011-10-22 (×59): qty 30

## 2011-10-22 MED ORDER — CITALOPRAM HYDROBROMIDE 20 MG PO TABS
20.0000 mg | ORAL_TABLET | Freq: Every day | ORAL | Status: DC
  Administered 2011-10-22 – 2011-10-28 (×7): 20 mg via ORAL
  Filled 2011-10-22 (×8): qty 1

## 2011-10-22 MED ORDER — MEGESTROL ACETATE 40 MG/ML PO SUSP
400.0000 mg | Freq: Two times a day (BID) | ORAL | Status: DC
  Administered 2011-10-22 – 2011-10-27 (×10): 400 mg via ORAL
  Filled 2011-10-22 (×12): qty 10

## 2011-10-22 NOTE — Progress Notes (Signed)
Patient alert, agitated with non-restraint mitten on patient continue to pull on the foley catheter.  Foley catheter was leaking, new foley inserted this AM.  Patient's O2 sats stayed in mid 90's throughout the night on RA. Peg site drain moderate amount of tan color secretions, dsg changed once.  Patient had BM X2 without adm. suppository.  Maxi lift to transfer.  Continue with plan of care.

## 2011-10-22 NOTE — Progress Notes (Signed)
Occupational Therapy Session Note  Patient Details  Name: Mark [name] MRN: [id number] Date of Birth: 05/25/1966  Today's Date: 10/22/2011 Time: 1130-1200 Time Calculation (min): 30 min  Short Term Goals: Week 2:  OT Short Term Goal 1 (Week 2): Pt will follow one step commands with extra time with mod tactile cuing OT Short Term Goal 2 (Week 2): Pt will tolerate sitting EOB for 10 min A in prep for UB dressing OT Short Term Goal 3 (Week 2): Pt will dress UB with Mod A OT Short Term Goal 4 (Week 2): Pt will engage in a grooming task with right hand with mod A and initiation cues.  Skilled Therapeutic Interventions/Progress Updates:    1:1 cognitive retraining: Engaged in playing cards [facility] Ree Kida) focusing on recall of dealing 2 cards each, adding numbers, comparing 2 numbers for which is higher, turn taking, sustained attention with mod A with extra time for initiation and answering questions verbally. Sorted deck of cards to suits (a field of 4) with min A. Pt engaged in brief conversation about birthday and wanting a birthday party and that he would turn 68 in July! Pt with some tightness and discomfort in neck, stretching and positioning to neutral.  Therapy Documentation Precautions:  Precautions Precautions: Fall Required Braces or Orthoses: Cervical Brace Cervical Brace: Hard collar;Applied in supine position Restrictions Weight Bearing Restrictions: No Pain: Pain Assessment Pain Score:   8 Pain Location: Head Pain Orientation: Posterior Pain Descriptors: Aching Pain Intervention(s): Medication (See eMAR)  See FIM for current functional status  Therapy/Group: Individual Therapy  [name] [facility] 10/22/2011, 3:07 PM

## 2011-10-22 NOTE — Progress Notes (Addendum)
Nutrition Follow-up  CT of PEG tube showed tube in abdominal wall with inflammation about the area. PEG was not replaced as FEES on 4/23 recommending Regular diet with thin liquids. Trach removed 4/24.  Megace added this morning.  Discussed PO intake with SLP working with patient at breakfast this morning. Pt consumed all of his eggs and sausage. Also drank a [employer].  Diet Order: Regular with thin liquids Previous TF order, now discontinued: Bolus of Jevity 1.2, 420 ml, 5 times daily. 30 ml Prostat via tube BID.   Meds: Scheduled Meds:   . chlorhexidine  15 mL Mouth/Throat QID  . dantrolene  25 mg Oral BID  . hydrocerin   Topical BID  . megestrol  400 mg Oral BID  . methylphenidate  5 mg Per Tube BID WC  . metoprolol tartrate  50 mg Per Tube BID  . pantoprazole  40 mg Oral Q1200  . piperacillin-tazobactam (ZOSYN)  IV  3.375 g Intravenous Q8H  . vancomycin  1,000 mg Intravenous Q8H  . DISCONTD: insulin aspart  0-9 Units Subcutaneous Q4H  . DISCONTD: megestrol  400 mg Oral Daily  . DISCONTD: methylphenidate  15 mg Per Tube BID WC  . DISCONTD: metoprolol  5 mg Intravenous Q6H  . DISCONTD: metoprolol  5 mg Intravenous Q6H  . DISCONTD: metoprolol tartrate  50 mg Per Tube BID  . DISCONTD: pantoprazole (PROTONIX) IV  40 mg Intravenous QHS   Continuous Infusions:   . DISCONTD: dextrose 5 % and 0.45% NaCl 100 mL/hr at 10/19/11 2040   PRN Meds:.acetaminophen, alum & mag hydroxide-simeth, bisacodyl, guaiFENesin-dextromethorphan, hydrocortisone cream, oxyCODONE, polyethylene glycol, prochlorperazine, prochlorperazine, prochlorperazine, traMADol, DISCONTD:  morphine injection  Labs:  CMP     Component Value Date/Time   NA 142 10/20/2011 0514   K 4.1 10/20/2011 0514   CL 107 10/20/2011 0514   CO2 23 10/20/2011 0514   GLUCOSE 134* 10/20/2011 0514   BUN 17 10/20/2011 0514   CREATININE 0.65 10/20/2011 0514   CALCIUM 9.8 10/20/2011 0514   PROT 7.2 10/09/2011 0735   ALBUMIN 2.9*  10/09/2011 0735   AST 16 10/09/2011 0735   ALT 22 10/09/2011 0735   ALKPHOS 94 10/09/2011 0735   BILITOT 0.3 10/09/2011 0735   GFRNONAA >90 10/20/2011 0514   GFRAA >90 10/20/2011 0514   CBG (last 3)   Basename 10/21/11 1740 10/21/11 1347 10/21/11 1148  GLUCAP 126* 114* 125*    Intake/Output Summary (Last 24 hours) at 10/22/11 0901 Last data filed at 10/22/11 6213  Gross per 24 hour  Intake     60 ml  Output   1351 ml  Net  -1291 ml   Weight Status:  113.4 kg - wt stable  Estimated needs:  2500 - 2800 kcal, 145 - 160 grams protein  Nutrition Dx:  Inadequate oral intake now r/t variable appetite AEB variable PO intake and need for supplements to meet kcal and protein goals.   Previous Goal:  Pt to tolerate TF. TF to meet >90% of estimated needs. Discontinue. New Goal: Pt to consume >/= 75% of estimated needs. Improving.  Intervention:  To better meet estimated nutrition needs, pt would benefit from oral nutrition supplements. Will add Resource Breeze PO BID and 30 ml Prostat PO BID. These supplements will provide an additional 644 kcal and 48 grams protein.  Monitor: weights, labs, I/O's, PO intake  Adair Laundry Pager #:  [phone]

## 2011-10-22 NOTE — Progress Notes (Signed)
Physical Therapy Note  Patient Details  Name: Mark [name] MRN: [id number] Date of Birth: Sep 08, 1965 Today's Date: 10/22/2011  Time: 1300-1400 60 minutes  Pt with facial grimacing with movement of neck and R LE; relieved by repositioning.  PROM to neck to increase ROM in all directions, pt initially resistant but relaxed over time with treatment.  Reaching task to promote forward trunk flexion/rotation with R hand; pt participated with min-mod subtle/verbal cuing for x10 min with rest breaks between each repetition.  Hand over hand assist for L hand to promote reaching across midline, awareness and use of L UE.  Pt required mod A for opening L hand to release objects.  Standing in Sara+  with +2 assist initially to promote upright posture, positioning B LEs.  Pt tolerated standing x5 minutes, 2 minutes with mod A x1 for promoting extension of hips, knees, trunk.  Weightshifting over R and L with min A.  Pt with increased participation today with decreased distractibility, increased speaking throughout treatment.    Individual therapy  Lockie Pares 10/22/2011, 3:46 PM

## 2011-10-22 NOTE — Progress Notes (Signed)
Foley to straight drain with amber, cloudy color urine. Dry dressing to trach stoma, clean, dry, intact. Old peg site with moderate amount of serosanguinous drain.Area cleaned, new dressing reapplied. A small amount of dry blood noted on foley catheter. Night nurse report that pt was pulling on old foley catheter prior to insertion of new catheter. No visible noted in urine.  C/o headache with report of 10/10 pain. Tramadol 50mg  given-effective. Meds crushed in puree. Able to take thin liquids.  Follows commands appropriately.Respond with 1-2 words at a time. Dark discolored area to R posterior foot. Scrotum with mild edema. Receiving IV Vancomycin, and Zoysn per order. No unsafe behavior. At nurse's station when not in therapy session. Requires staff to anticipate needs. Mom in today for visit, supportive.

## 2011-10-22 NOTE — Progress Notes (Signed)
Social Work Patient ID: Mark [name], male   DOB: 1965-11-21, 46 y.o.   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  We were able to get his signature on a release form needed by DSS to process Medicaid application.  Pt smiled at joke from SW and mother.  Mother reports she is very encouraged by pt's progress. Will continue to follow for emotional support and d/c planning needs.  Jujuan Dugo

## 2011-10-22 NOTE — Progress Notes (Signed)
Physical Therapy Session Note  Patient Details  Name: Mark [name] MRN: [id number] Date of Birth: May 30, 1966  Today's Date: 10/22/2011 Time: 4098-1191 Time Calculation (min): 55 min  Short Term Goals: Week 2:  PT Short Term Goal 1 (Week 2): pt will attend to functional task x30 seconds with min cuing PT Short Term Goal 2 (Week 2): pt will maintain sitting balance with mod A x 3 minutes PT Short Term Goal 3 (Week 2): initiate forward wt shift to assist with transfers 50 % of the time  Skilled Therapeutic Interventions/Progress Updates:    Selective attention/sitting balance on mat  to hitting ball in min distractive environment 15 min with mod instructional cues; pt able to name each color and answer specific questions about his job and nickname without cues; pt sat EOM 45 minutes approx with close S with intermittent passive stretching to neck muscles to prevent further ROM loss and kinesiotape applied.  Transfers w/c to/from mat with slide board (pt assisted placing board) but little to no active participation in transfer- poor initiation of weight shift or pushing with UE's or LE; passive stretch to L knee to increase flexion 68 degrees; mother observed treatment  Therapy Documentation Precautions:  Precautions Precautions: Fall Required Braces or Orthoses: Cervical Brace Cervical Brace: Hard collar;Applied in supine position Restrictions Weight Bearing Restrictions: No      Pain: Pain Assessment Pain Score: 0-No pain; discomfort only with passive stretch to neck and R knee   Other Treatments:  a/rom neck within limited range  See FIM for current functional status  Therapy/Group: Individual Therapy  Michaelene Song 10/22/2011, 4:07 PM
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
## 2011-10-22 NOTE — Progress Notes (Signed)
Occupational Therapy Session Note  Patient Details  Name: Mark [name] MRN: [id number] Date of Birth: [birth year]  Today's Date: 10/22/2011 Time: 0830-0930 Time Calculation (min): 60 min  Short Term Goals: Week 2:  OT Short Term Goal 1 (Week 2): Pt will follow one step commands with extra time with mod tactile cuing OT Short Term Goal 2 (Week 2): Pt will tolerate sitting EOB for 10 min A in prep for UB dressing OT Short Term Goal 3 (Week 2): Pt will dress UB with Mod A OT Short Term Goal 4 (Week 2): Pt will engage in a grooming task with right hand with mod A and initiation cues.  Skilled Therapeutic Interventions/Progress Updates:    1:1 self care retraining: Bed mobility rolling side to side to remove clothing with max A (+2) with pt holding railing (with a person on each side), maxi sky lift to roll in shower chair, showered in room with focus on following one step commands with mod tactile cues for initiation, decreasing perseveration with washing UB with tactile cues, use of bilateral UE right> left, sit to stand with [place] Dec Plus with upright posture and anterior pelvic weight shift for clothing management. Still with tone in right LE, unable to gain any flexion in knee even with standing for proper foot placement.  Therapy Documentation Precautions:  Precautions Precautions: Fall Required Braces or Orthoses: Cervical Brace Cervical Brace: Hard collar;Applied in supine position Restrictions Weight Bearing Restrictions: No Pain:  headache and rib pain- RN notified - unrated    See FIM for current functional status  Therapy/Group: Individual Therapy  [name] [facility] 10/22/2011, 10:58 AM

## 2011-10-22 NOTE — Progress Notes (Signed)
Speech Language Pathology Daily Session Note  Patient Details  Name: Mark [name] MRN: [id number] Date of Birth: 04-24-1966  Today's Date: 10/22/2011 Time: 0930-1030 Time Calculation (min): 60 min  Short Term Goals: SLP Short Term Goal 1 (Week 2): Pt will initiate trials during self-feeding task with Mod A visual and tactile cues. SLP Short Term Goal 2 (Week 2): Pt will focus attention during a functional task for 30 secs with Max A verbal cues.  SLP Short Term Goal 3 (Week 2): Pt will verbalize yes/no in reponse to a question 50% of the time.  SLP Short Term Goal 4 (Week 2): Pt will focus attention to left enviornment with Mod verbal and visual cues.   Skilled Therapeutic Interventions: Treatment focus on attention, initiation and verbal expression with self-feeding. Pt required Mod cueing with placing fork in hand to initiate meal which faded to Min A throughout meal. Pt without overt s/s of aspiration but required Max A verbal and tactile cues to utilize small bites. Pt gesturing "no" throughout meal to questions in regards to hunger, etc, however, pt consumed 75% of meal and 32oz of liquid. Pt's answers appear more accurate when verbalized. For example, pt shook head "no" when asked if he was in pain but when clinician asked "what is wrong," the pt responded he had a headache. Pt performed "swish and spit" with medication and then demonstrated perseveration with "swishing and spitting" all liquids for the rest of the session. Pt sorted cards with pictures on them from field of four with 100% accuracy.    Daily Session FIM:  Comprehension Comprehension Mode: Auditory Comprehension: 2-Understands basic 25 - 49% of the time/requires cueing 51 - 75% of the time Expression Expression: 1-Expresses basis less than 25% of the time/requires cueing greater than 75% of the time. Social Interaction Social Interaction: 1-Interacts appropriately less than 25% of the time. May be withdrawn or  combative. Problem Solving Problem Solving: 1-Solves basic less than 25% of the time - needs direction nearly all the time or does not effectively solve problems and may need a restraint for safety Memory Memory: 1-Recognizes or recalls less than 25% of the time/requires cueing greater than 75% of the time FIM - Eating Eating Activity: 4: Helper occasionally scoops food on utensil Pain Pain Assessment Pain Assessment: 0-10 Pain Score: 10-Worst pain ever Pain Type: Acute pain Pain Location: Head Pain Orientation: Posterior Pain Descriptors: Aching Pain Onset: Unable to tell Patients Stated Pain Goal: 2 Pain Intervention(s): RN made aware Multiple Pain Sites: No  Therapy/Group: Individual Therapy  Khris Jansson 10/22/2011, 11:58 AM

## 2011-10-23 LAB — BASIC METABOLIC PANEL
BUN: 13 mg/dL (ref 6–23)
CO2: 25 mEq/L (ref 19–32)
Calcium: 9.9 mg/dL (ref 8.4–10.5)
Creatinine, Ser: 0.84 mg/dL (ref 0.50–1.35)
Glucose, Bld: 140 mg/dL — ABNORMAL HIGH (ref 70–99)
Sodium: 141 mEq/L (ref 135–145)

## 2011-10-23 MED ORDER — VANCOMYCIN HCL 1000 MG IV SOLR
1250.0000 mg | Freq: Two times a day (BID) | INTRAVENOUS | Status: DC
  Administered 2011-10-23 – 2011-10-27 (×8): 1250 mg via INTRAVENOUS
  Filled 2011-10-23 (×8): qty 1250

## 2011-10-23 MED ORDER — DANTROLENE SODIUM 25 MG PO CAPS
25.0000 mg | ORAL_CAPSULE | Freq: Three times a day (TID) | ORAL | Status: DC
  Administered 2011-10-23 – 2011-10-25 (×9): 25 mg via ORAL
  Filled 2011-10-23 (×13): qty 1

## 2011-10-23 MED ORDER — SACCHAROMYCES BOULARDII 250 MG PO CAPS
500.0000 mg | ORAL_CAPSULE | Freq: Two times a day (BID) | ORAL | Status: DC
  Administered 2011-10-23 – 2011-11-03 (×22): 500 mg via ORAL
  Filled 2011-10-23 (×25): qty 2

## 2011-10-23 MED ORDER — POTASSIUM CHLORIDE CRYS ER 20 MEQ PO TBCR
20.0000 meq | EXTENDED_RELEASE_TABLET | Freq: Two times a day (BID) | ORAL | Status: AC
  Administered 2011-10-23 – 2011-10-25 (×4): 20 meq via ORAL
  Filled 2011-10-23 (×5): qty 1
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
## 2011-10-23 NOTE — Progress Notes (Signed)
Physical Therapy Note  Patient Details  Name: Mark [name] MRN: [id number] Date of Birth: 19-Jul-1965 Today's Date: 10/23/2011  Time: 1100-1200 60 minutes  Facial grimacing with flexion of R knee; pain relieved by repositioning.  Focus of treatment on active participation in transfers.  Sliding board transfer with +3 assist from w/c to mat with focus on allowing pt to initiate movement.  Hand over hand assist for positioning UEs, manual/verbal cuing for forward trunk flexion, manual assist at pelvis to promote weight shifting.  Pt 10% assist with transfer with initiation noted at hips.  Scooting L and R on mat with +2 assist for forward trunk flexion and UE placement; pt stated that he was scared to lean forward.  Noted active movement in L UE to bring tissue to face.  Transferred w/c>bed with +3 total A, pt pushing trunk into extension and required total A to prevent from sliding to floor.  Pt stated that he had "had a big turd"; assisted 10% with rolling R and L for hygiene.  Overall pt more vocal throughout treatment with increased participation 10% for transfers, at a Level [place] level without aggression (increasing intellectual awareness of deficits, increasingly appropriate communication when verbalized, occasional attempts to pull at Foley, focused on basic needs).  Individual therapy  Lockie Pares 10/23/2011, 12:07 PM

## 2011-10-23 NOTE — Progress Notes (Signed)
Physical Therapy Note  Patient Details  Name: Mark [name] MRN: [id number] Date of Birth: 08/04/1965 Today's Date: 10/23/2011  Time: 1300-1355 55 minutes  Pt c/o pain in bottom, neck, genitals; pt had been previously premedicated.  Pt extremely fatigued throughout session with difficulty keeping eyes open with little participation during treatment.  Sup>sit transfer with +2 total A, pt 10% assist with HOB elevated for trunk.  Sliding board transfer with +3 assist to w/c; pt required continual repositioning of R UE throughout, total A for forward trunk flexion.  Pt initiating <10% for transfer.  Attempted to engage pt in reaching, playing cards; pt unable to keep eyes open and complaining of pain in bottom.  Discussed pt's fatigue with RN.  Individual therapy    Lockie Pares 10/23/2011, 3:26 PM

## 2011-10-23 NOTE — Progress Notes (Signed)
Speech Language Pathology Weekly Progress & Session Notes  Patient Details  Name: Mark [name] MRN: [id number] Date of Birth: 1965/11/30  Today's Date: 10/23/2011  Session Time 1: 0930-1030 Time Calculation: 60 min  Session Time 2: 1430-1510 Time Calculation (min): 40 min  Short Term Goals:  SLP Short Term Goal 1 (Week 2): Pt will initiate trials during self-feeding task with Mod A visual and tactile cues. SLP Short Term Goal 1 - Progress (Week 2): Met SLP Short Term Goal 2 (Week 2): Pt will focus attention during a functional task for 30 secs with Max A verbal cues.  SLP Short Term Goal 2 - Progress (Week 2): Met SLP Short Term Goal 3 (Week 2): Pt will verbalize yes/no in reponse to a question 50% of the time.  SLP Short Term Goal 3 - Progress (Week 2): Met SLP Short Term Goal 4 (Week 2): Pt will focus attention to left enviornment with Mod verbal and visual cues.  SLP Short Term Goal 4 - Progress (Week 2): Met  New Short Term Goals:  SLP Short Term Goal 1 (Week 3): Pt will initiate self-feeding task with Min A verbal and visual cues. SLP Short Term Goal 2 (Week 3): Pt will demonstrate sustained attention during a functional task for ~2 minutes with Max A verbal cues.  SLP Short Term Goal 3 (Week 3): Pt will verbally express wants and needs with Mod A questioning cues.  SLP Short Term Goal 4 (Week 3): Pt will demonstrate intellectual awareness with Mod A questioning and semantic cues.   Weekly Progress Updates: Pt has made functional gains and has met all STG's this reporting period. Currently, pt's cognitive function is inconsistent depending on fatigue and pain and is demonstrating behaviors between a Rancho Level IV-V. Pt has been decannulated and is consuming regular textures with thin liquids without overt s/s of aspiration. Pt is demonstrating increased verbal expression, especially in regards to wants/needs and is demonstrating increased inappropriate conversation. Pt is  consistently oriented to person and place and will independently utilize calendar to recall the date. Pt is overall Max A for initiation, working memory, intellectual awareness in regards to situation and deficits, and functional problem solving. Pt would benefit from continued skilled SLP treatment to maximize cognitive function and overall independence for discharge home.   Daily Session Skilled Therapeutic Intervention:   Session 1: Treatment focus on initiation, verbal expression, and utilization of external memory aids. Pt demonstrating increased verbal expression and answered all questions verbally today. Pt also demonstrating increased inappropriate verbal expression. SLP ignored inappropriate language and redirected conversation. Pt required Max A verbal, tactile and visual cueing to initiate self-feeding. Pt impulsive throughout meal and consumed extremely large bites without overt s/s of aspiration. Pt required Mod A verbal and visual cues to attend to simple reading comprehension task and completed task with 100% accuracy. Pt also completed written expression task of writing his name and address with Mod A verbal and visual cues to attend to left side of page.   Session 2: Treatment focus on sustained attention, initiation and functional problem solving. Pt sorted change from field of 4 with Mod verbal cues for organization and to self-monitor perseveration with change. Pt counted money with Mod A verbal and questioning cues to self-monitor and correct errors. Sustained attention to task impacted by pain and dry skin on pt's right hand. Pt sequenced 4 step picture cards with mod A semantic and questioning cues. Pt demonstrated increased intellectual awareness at end of session while  transferring back to bed by reporting, "this sucks, why am I here?"   FIM:  Comprehension Comprehension: 2-Understands basic 25 - 49% of the time/requires cueing 51 - 75% of the time Expression Expression Mode:  Verbal Expression: 2-Expresses basic 25 - 49% of the time/requires cueing 50 - 75% of the time. Uses single words/gestures. Social Interaction Social Interaction: 1-Interacts appropriately less than 25% of the time. May be withdrawn or combative. Problem Solving Problem Solving: 1-Solves basic less than 25% of the time - needs direction nearly all the time or does not effectively solve problems and may need a restraint for safety Memory Memory: 1-Recognizes or recalls less than 25% of the time/requires cueing greater than 75% of the time Pain: Pt reporting pain due to positioning in chair. Pt repositioned and reclined in chair and reported increased comfort.  Cognition: Overall Cognitive Status: Impaired Arousal/Alertness: Awake/alert Orientation Level: Oriented to place;Oriented to person;Oriented to time;Disoriented to situation Focused Attention: Appears intact Sustained Attention: Impaired Sustained Attention Impairment: Verbal basic;Functional basic Selective Attention: Impaired Selective Attention Impairment: Verbal basic;Functional basic Memory: Impaired Memory Impairment: Decreased short term memory;Decreased recall of new information Decreased Short Term Memory: Verbal basic;Functional basic Awareness: Impaired Awareness Impairment: Intellectual impairment Problem Solving: Impaired Problem Solving Impairment: Verbal basic;Functional basic Executive Function:  (all impaired) Behaviors: Perseveration Safety/Judgment: Impaired Comments: Pt is demonstrating inconsistent behaviors  between a Rancho Level IV-emerging V demending on fatigue.  Rancho [employer] Scales of Cognitive Functioning: Confused/agitated Oral/Motor: Oral Motor/Sensory Function Overall Oral Motor/Sensory Function: Appears within functional limits for tasks assessed Motor Speech Phonation: Low vocal intensity (speaks at a "whisper" ) Effective Techniques: Increased vocal intensity Comprehension: Auditory  Comprehension Overall Auditory Comprehension: Impaired Yes/No Questions: Within Functional Limits Basic Biographical Questions: 76-100% accurate (answers more accurate verbally than with gestures ) Commands: Impaired One Step Basic Commands: 50-74% accurate Conversation: Simple Interfering Components: Attention;Pain;Processing speed EffectiveTechniques: Extra processing time;Repetition;Visual/Gestural cues Visual Recognition/Discrimination Discrimination: Within Function Limits Reading Comprehension Reading Status:  (simple reading tasks: [facility]) Expression: Expression Primary Mode of Expression: Verbal Verbal Expression Overall Verbal Expression: Impaired Initiation: Impaired Automatic Speech: Name;Social Response Level of Generative/Spontaneous Verbalization: Phrase;Word;Sentence Repetition: No impairment Naming: No impairment Pragmatics: Impairment Impairments: Topic appropriateness;Eye contact;Abnormal affect Interfering Components: Attention Effective Techniques:  (yes/no questions) Other Verbal Expression Comments: Pt demonstrating increased verbal expression, especially in response to wants/needs. Pt aslo demonstrating increased inappropriate verbal expression.  Written Expression Dominant Hand: Right Written Expression:  (independently wrote name, assistance with address )  Therapy/Group: Individual Therapy  Wadell Craddock 10/23/2011, 3:48 PM
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
## 2011-10-23 NOTE — Significant Event (Signed)
CRITICAL VALUE ALERT  Critical value received:  Vancomycin  trough  Date of notification:  10-23-11  Time of notification:  1159  Critical value read back:yes  Nurse who received alert:  Rowe Clack RN  MD notified (1st page):  P. Love PA  Time of first page:  1215  MD notified (2nd page):  Time of second page:  Responding MD:  Cipriano Mile, RN  Time MD responded: [phone]

## 2011-10-23 NOTE — Progress Notes (Addendum)
Occupational Therapy Session Note  Patient Details  Name: Mark [name] MRN: [id number] Date of Birth: Jul 05, 1965  Today's Date: 10/23/2011 Time: 0830-0930 Time Calculation (min): 60 min  Short Term Goals: Week 2:  OT Short Term Goal 1 (Week 2): Pt will follow one step commands with extra time with mod tactile cuing OT Short Term Goal 2 (Week 2): Pt will tolerate sitting EOB for 10 min A in prep for UB dressing OT Short Term Goal 3 (Week 2): Pt will dress UB with Mod A OT Short Term Goal 4 (Week 2): Pt will engage in a grooming task with right hand with mod A and initiation cues.  Skilled Therapeutic Interventions/Progress Updates:    1:1 GREAT SESSION. Self care retraining: pt more engaged socially with more verbalizations in session, focus on functional use of bilateral UEs (right >left), assisting with bed mobility. Max A (bilateral LEs and trunk ) for supine to sitting EOB, sat EOB with supervision to don shirt and prep for transfer. Pt demonstrated intellectual awareness with transfer knowing a slide board goes under his thigh and a pad goes with him to the chair, pt increased awareness of process to transfer to chair but still needed total A+2 for transfer, performed grooming tasks at the sink with mod cuing for attention/perseveration/initiation/task sequence. Pt able to demonstrate increased right knee flexion sitting EOB and in w/c without leg rests on. Pt demonstrating more behaviors consistent with Rancho Level IV without the agitation (demonstrates poor memory, but increasing attention and intellectual awareness, functional communication, and some strange/inappropriate touching with decreased awareness and at times pulling at brief and foley.)  Therapy Documentation Precautions:  Precautions Precautions: Fall Required Braces or Orthoses: Cervical Brace Cervical Brace: Hard collar;Applied in supine position Restrictions Weight Bearing Restrictions: No Pain:  complaints of  abdominal pain. RN aware  See FIM for current functional status  Therapy/Group: Individual Therapy  [name] [facility] 10/23/2011, 9:33 AM

## 2011-10-23 NOTE — Progress Notes (Signed)
Physical Therapy Weekly Progress Note  Patient Details  Name: Mark [name] MRN: [id number] Date of Birth: 12/26/1965  Today's Date: 10/23/2011  Patient has met 1 of 3 short term goals.  Pt demonstrates improved unsupported sitting balance with overall supervision level at EOB and is more participatory during therapy, but continues to resist forward trunk flexion and is generally 10% participating during transfers.  He has begun initiating spontaneous use of L hand and increased tone in L LE.  Rigid tone in R LE is improving.  Pt c/o pain in neck, R knee, stomach and genital/perineal area which limits participation in therapy.  Sustained attention is inconsistent depending on fatigue and pt continues to be easily distracted by auditory/visual/internal stimuli.  Patient continues to demonstrate the following deficits: decreased functional mobility, decreased use of R LE, L UE and L LE, decreased activity tolerance, decreased strength, pain, decreased awareness and attention, and therefore will continue to benefit from skilled PT intervention to enhance overall performance with activity tolerance, balance, postural control, ability to compensate for deficits, functional use of  right lower extremity, left upper extremity and left lower extremity, attention, awareness and coordination.  Patient progressing toward long term goals..  Continue plan of care.  PT Short Term Goals Week 2:  PT Short Term Goal 1 (Week 2): pt will attend to functional task x30 seconds with min cuing PT Short Term Goal 1 - Progress (Week 2): Progressing toward goal (inconsistent) PT Short Term Goal 2 (Week 2): pt will maintain sitting balance with mod A x 3 minutes PT Short Term Goal 2 - Progress (Week 2): Met PT Short Term Goal 3 (Week 2): initiate forward wt shift to assist with transfers 50 % of the time PT Short Term Goal 3 - Progress (Week 2): Progressing toward goal Week 3:  PT Short Term Goal 1 (Week 3): pt will attend  to functional task x1 minute with min cuing consistently PT Short Term Goal 2 (Week 3): pt will tolerate standing upright in Sara+ x2 minutes with min A at trunk/hips PT Short Term Goal 3 (Week 3): initiate forward wt shift to assist with transfers 50 % of the time  Skilled Therapeutic Interventions/Progress Updates:  Ambulation/gait training;Balance/vestibular training;Discharge planning;Cognitive remediation/compensation;Functional mobility training;Therapeutic Activities;UE/LE Coordination activities;Stair training;Patient/family education;DME/adaptive equipment instruction;Neuromuscular re-education;Pain management;Splinting/orthotics;UE/LE Strength taining/ROM;Therapeutic Exercise;Wheelchair propulsion/positioning    See FIM for current functional status  Therapy/Group: Individual Therapy  Lockie Pares 10/23/2011, 3:58 PM
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
## 2011-10-24 LAB — CULTURE, BLOOD (ROUTINE X 2)
Culture  Setup Time: 201304212231
Culture: NO GROWTH

## 2011-10-24 NOTE — Progress Notes (Signed)
Occupational Therapy Session Note  Patient Details  Name: Mark [name] MRN: [id number] Date of Birth: [birth year]  Today's Date: 10/24/2011 Time: 1400-1430 Time Calculation (min): 30 min  Skilled Therapeutic Interventions/Progress Updates: patient in w/c upon arrival and completed oral care with R UE with HOH assist to stabilize toothbrush while he applied paste with right hand; neck rotation  Especially to the left; L UE ROM with some movement in shoulder extension and left digits    Therapy Documentation Precautions:  Precautions Precautions: Fall Required Braces or Orthoses: Cervical Brace Cervical Brace: Hard collar;Applied in supine position Restrictions Weight Bearing Restrictions: No  Pain:  No c/os   ADL Eating: NPO Grooming: Dependent Upper Body Bathing: Dependent Lower Body Bathing: Dependent Upper Body Dressing: Dependent Lower Body Dressing: Dependent Toileting: Dependent See FIM for current functional status  Therapy/Group: Individual Therapy  Bud Face [facility] 10/24/2011, 4:21 PM

## 2011-10-24 NOTE — Progress Notes (Signed)
Speech Language Pathology Daily Session Note  Patient Details  Name: Mark [name] MRN: [id number] Date of Birth: 22-Aug-1965  Today's Date: 10/24/2011 Time: 1191-4782 Time Calculation (min): 45 min  Short Term Goals:  SLP Short Term Goal 1 (Week 3): Pt will initiate self-feeding task with Min A verbal and visual cues. SLP Short Term Goal 2 (Week 3): Pt will demonstrate sustained attention during a functional task for ~2 minutes with Max A verbal cues.  SLP Short Term Goal 3 (Week 3): Pt will verbally express wants and needs with Mod A questioning cues.  SLP Short Term Goal 4 (Week 3): Pt will demonstrate intellectual awareness with Mod A questioning and semantic cues.   Skilled Therapeutic Interventions: Treatment focus on initiation, sustained attention and verbal expression. Total A to initiate verbal expression at beginning of session. Required Mod verbal and tactile cues for slow pace with meal. Pt did not demonstrate overt s/s of aspiration. Required Min A verbal and visual cue to initiate meal and Min A to select attention to meal for ~30 minutes in a mildly distracting environment (door open in room). Pt demonstrating appropriate humor throughout meal and initiated expression of wants and needs in regards to pain. Pt with inconsistent increased vocal intensity and hoarseness during verbal expression.    Daily Session FIM:  Comprehension Comprehension Mode: Auditory Comprehension: 2-Understands basic 25 - 49% of the time/requires cueing 51 - 75% of the time Expression Expression: 2-Expresses basic 25 - 49% of the time/requires cueing 50 - 75% of the time. Uses single words/gestures. Social Interaction Social Interaction: 2-Interacts appropriately 25 - 49% of time - Needs frequent redirection. Problem Solving Problem Solving: 1-Solves basic less than 25% of the time - needs direction nearly all the time or does not effectively solve problems and may need a restraint for  safety Memory Memory: 1-Recognizes or recalls less than 25% of the time/requires cueing greater than 75% of the time FIM - Eating Eating Activity: 5: Set-up assist for cut food Pain Pain Assessment Pain Assessment: 0-10 Pain Score:   9 Pain Type: Acute pain Pain Location: Neck Pain Descriptors: Aching Pain Onset: Unable to tell Patients Stated Pain Goal: 2 Pain Intervention(s): Repositioned;RN made aware Multiple Pain Sites: No  Therapy/Group: Individual Therapy  [name] 10/24/2011, 8:49 AM
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
## 2011-10-25 NOTE — Plan of Care (Signed)
Problem: RH BOWEL ELIMINATION Goal: RH STG MANAGE BOWEL WITH ASSISTANCE STG Manage Bowel with Mod. Assistance.  Outcome: Not Progressing Continues with incontinence.c-diff negative  Problem: RH COGNITION-NURSING Goal: RH STG ANTICIPATES NEEDS/CALLS FOR ASSIST W/ASSIST/CUES STG Anticipates Needs/Calls for Assist With mod Assistance/Cues.  Outcome: Not Progressing Does not use call bell
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
## 2011-10-25 NOTE — Progress Notes (Signed)
Physical Therapy Note  Patient Details  Name: Demitrious Mccannon MRN: [id number] Date of Birth: 1965-08-22 Today's Date: 10/25/2011  [id number] (445 minutes) individual Pain: pt reports spasms rt LE/meds given during therapy Focus of treatment: AAROM bilateral LEs focusing on slow passive stretch RT knee to improve functional flexion in sitting; transfers +2 MaxiSKY bed > WC   Luby Seamans,JIM 10/25/2011, 10:28 AM

## 2011-10-26 ENCOUNTER — Inpatient Hospital Stay (HOSPITAL_COMMUNITY): Payer: BC Managed Care – PPO

## 2011-10-26 MED ORDER — DANTROLENE SODIUM 25 MG PO CAPS
25.0000 mg | ORAL_CAPSULE | Freq: Four times a day (QID) | ORAL | Status: DC
  Administered 2011-10-26 – 2011-11-04 (×38): 25 mg via ORAL
  Filled 2011-10-26 (×41): qty 1

## 2011-10-26 MED ORDER — DICLOFENAC SODIUM 1 % TD GEL
Freq: Four times a day (QID) | TRANSDERMAL | Status: DC
  Administered 2011-10-26 – 2011-11-19 (×91): via TOPICAL
  Filled 2011-10-26 (×4): qty 100

## 2011-10-26 MED ORDER — FENTANYL 12 MCG/HR TD PT72
12.5000 ug | MEDICATED_PATCH | TRANSDERMAL | Status: DC
  Administered 2011-10-26 – 2011-11-19 (×8): 12.5 ug via TRANSDERMAL
  Filled 2011-10-26 (×8): qty 1
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
## 2011-10-26 NOTE — Progress Notes (Signed)
Physical Therapy Note  Patient Details  Name: Mark [name] MRN: [id number] Date of Birth: 1965-09-30 Today's Date: 10/26/2011  Time: 1500-1530 30 minutes  Pt with crying out, wincing with R knee flexion.  Pt with decreased initiation and decreased assistance with mobility noted this afternoon ? Fatigue vs pain.  Maxi sky transfer to bed.  Rolling in bed with pt assisting <10%.  Pt with increased talking, better able to communicate needs with therapists.  Some agitation noted as pt yelling curse words during transfers due to pain.  Pt missed 30 minutes skilled PT due to off unit for x ray.  Individual therapy     Inna Tisdell 10/26/2011, 4:26 PM

## 2011-10-26 NOTE — Progress Notes (Signed)
Occupational Therapy Weekly Progress Note  Patient Details  Name: Mark [name] MRN: [id number] Date of Birth: 1965-11-24  Today's Date: 10/26/2011  Patient has met 4 of 5 short term goals.  Pt continues to make good gains meeting 4 out of 5 goals. Currently, pt's cognitive function is inconsistent depending on fatigue and pain and is demonstrating behaviors between a Rancho Level IV-V.  Pt is demonstrating increased verbal expression, especially in regards to wants/needs and is demonstrating increased inappropriate conversation and touching. Pt is consistently oriented to person and place and will independently utilize calendar to recall the date. Pt is overall Max A for initiation, termination, attention, working memory, intellectual awareness in regards to situation and deficits, and functional problem solving. Patient continues to demonstrate the following deficits:muscle weakness and muscle joint tightness, decreased cardiorespiratoy endurance, impaired timing and sequencing, abnormal tone, unbalanced muscle activation, decreased coordination and decreased motor planning, decreased midline orientation and decreased motor planning, decreased initiation, decreased attention, decreased awareness, decreased problem solving, decreased safety awareness, decreased memory and delayed processing and decreased standing balance and decreased balance strategies and therefore will continue to benefit from skilled OT intervention to enhance overall performance with BADL.  Patient progressing toward long term goals..  Continue plan of care.  OT Short Term Goals Week 2:  OT Short Term Goal 1 (Week 2): Pt will follow one step commands with extra time with mod tactile cuing OT Short Term Goal 1 - Progress (Week 2): Met OT Short Term Goal 2 (Week 2): Pt will tolerate sitting EOB for 10 min A in prep for UB dressing OT Short Term Goal 2 - Progress (Week 2): Met OT Short Term Goal 3 (Week 2): Pt will dress UB with Mod  A OT Short Term Goal 3 - Progress (Week 2): Partly met OT Short Term Goal 4 (Week 2): Pt will engage in a grooming task with right hand with mod A and initiation cues. OT Short Term Goal 4 - Progress (Week 2): Met   Week 3:  OT Short Term Goal 1 (Week 3): Pt will follow one step command with mod cuing for a familar tasks OT Short Term Goal 2 (Week 3): Pt will A during a slide board transfer 25% including weight shift in functional transfer OT Short Term Goal 3 (Week 3): Pt will initiate tasks with min cuing 50% time OT Short Term Goal 4 (Week 3): Pt will use call bell 25% appropriately  OT Short Term Goal 5 (Week 3): Pt will demonstrate sustained attention for 3 min with functional and familiar task.  Skilled Therapeutic Interventions/Progress Updates:  Balance/vestibular training;Neuromuscular re-education;Psychosocial support;Skin care/wound managment;Therapeutic Exercise;UE/LE Coordination activities;Wheelchair propulsion/positioning;UE/LE Strength taining/ROM;Therapeutic Activities;Self Care/advanced ADL retraining;Pain management;Patient/family education;Functional mobility training;DME/adaptive equipment instruction;Discharge planning;Cognitive remediation/compensation;Community reintegration;Disease mangement/prevention   Therapy Documentation Precautions:  Precautions Precautions: Fall Required Braces or Orthoses: Cervical Brace Cervical Brace: Hard collar;Applied in supine position Restrictions Weight Bearing Restrictions: No General: General Missed Time Reason: X-Ray Vital Signs: Therapy Vitals Temp: 97.9 F (36.6 C) Temp src: Oral Pulse Rate: 100  Resp: 20  BP: 141/96 mmHg Patient Position, if appropriate: Sitting Oxygen Therapy SpO2: 99 % O2 Device: None (Room air) Pain: Pain Assessment Pain Score:   5 ADL: ADL Eating: Supervision/safety;Moderate cueing Grooming: Minimal assistance;Moderate cueing Upper Body Bathing: Dependent Lower Body Bathing:  Dependent Upper Body Dressing: Maximal assistance Lower Body Dressing: Dependent Toileting: Dependent  See FIM for current functional status  Therapy/Group: Individual Therapy  [name] [facility] 10/26/2011, 4:42 PM

## 2011-10-26 NOTE — Progress Notes (Signed)
Speech Language Pathology Daily Session Notes  Patient Details  Name: Mark [name] MRN: [id number] Date of Birth: Dec 15, 1965  Today's Date: 10/26/2011  Session 1 Time: 0930-1030 Time Calculation (min): 60 min  Session 2 Time: 1430-1500 Time Calculation: 30 min   Short Term Goals:  SLP Short Term Goal 1 (Week 3): Pt will initiate self-feeding task with Min A verbal and visual cues. SLP Short Term Goal 2 (Week 3): Pt will demonstrate sustained attention during a functional task for ~2 minutes with Max A verbal cues.  SLP Short Term Goal 3 (Week 3): Pt will verbally express wants and needs with Mod A questioning cues.  SLP Short Term Goal 4 (Week 3): Pt will demonstrate intellectual awareness with Mod A questioning and semantic cues.   Skilled Therapeutic Interventions:   Session 1: Treatment focus on sustained attention, verbal expression and functional problem solving. Pt required total A to initiate self-feeding of meal today and Max A verbal and tactile cues to sustain attention for 30-60 seconds. Pt with increased problem solving for opening containers and straws. SLP facilitated verbal expression by utilizing picture cards to compare and contrast 2 items. Pt completed task with Min A semantic cues.   Session 2: Treatment focus on sustained attention and problem solving. Pt required Max verbal, semantic and tactile cues to attend to "uno" game. Pt's attention impacted by pain. Pt required Mod A verbal and visual cues for functional problem solving for matching numbers and colors. Pt with mild verbal agitation at beginning of session.    Daily Session FIM:  Comprehension Comprehension Mode: Auditory Comprehension: 3-Understands basic 50 - 74% of the time/requires cueing 25 - 50%  of the time Expression Expression Mode: Verbal Expression: 2-Expresses basic 25 - 49% of the time/requires cueing 50 - 75% of the time. Uses single words/gestures. Social Interaction Social Interaction:  2-Interacts appropriately 25 - 49% of time - Needs frequent redirection. Problem Solving Problem Solving: 1-Solves basic less than 25% of the time - needs direction nearly all the time or does not effectively solve problems and may need a restraint for safety Memory Memory: 1-Recognizes or recalls less than 25% of the time/requires cueing greater than 75% of the time Pain No/Denies Pain  Therapy/Group: Individual Therapy  Mariellen Blaney 10/26/2011, 12:11 PM

## 2011-10-26 NOTE — Progress Notes (Signed)
Nutrition Follow-up  Pt continues on Regular diet with thin liquids. Eating well and taking supplements as scheduled per RN.  Diet Order:  Regular with thin liquids Supplements: Resource Breeze PO BID and 30 ml Prostat PO BID (provides an additional 644 kcal and 48 grams protein)  Meds: Scheduled Meds:   . chlorhexidine  15 mL Mouth/Throat QID  . citalopram  20 mg Oral QHS  . dantrolene  25 mg Oral QID  . diclofenac sodium   Topical QID  . feeding supplement  30 mL Oral BID  . feeding supplement  1 Container Oral BID BM  . fentaNYL  12.5 mcg Transdermal Q72H  . hydrocerin   Topical BID  . megestrol  400 mg Oral BID  . methylphenidate  5 mg Per Tube BID WC  . metoprolol tartrate  50 mg Per Tube BID  . pantoprazole  40 mg Oral Q1200  . piperacillin-tazobactam (ZOSYN)  IV  3.375 g Intravenous Q8H  . saccharomyces boulardii  500 mg Oral BID  . vancomycin  1,250 mg Intravenous Q12H  . DISCONTD: dantrolene  25 mg Oral TID   Continuous Infusions:  PRN Meds:.acetaminophen, alum & mag hydroxide-simeth, bisacodyl, guaiFENesin-dextromethorphan, hydrocortisone cream, oxyCODONE, polyethylene glycol, prochlorperazine, prochlorperazine, prochlorperazine, traMADol  Labs:  CMP     Component Value Date/Time   NA 141 10/23/2011 1037   K 3.1* 10/23/2011 1037   CL 104 10/23/2011 1037   CO2 25 10/23/2011 1037   GLUCOSE 140* 10/23/2011 1037   BUN 13 10/23/2011 1037   CREATININE 0.84 10/23/2011 1037   CALCIUM 9.9 10/23/2011 1037   PROT 7.2 10/09/2011 0735   ALBUMIN 2.9* 10/09/2011 0735   AST 16 10/09/2011 0735   ALT 22 10/09/2011 0735   ALKPHOS 94 10/09/2011 0735   BILITOT 0.3 10/09/2011 0735   GFRNONAA >90 10/23/2011 1037   GFRAA >90 10/23/2011 1037   CBG (last 3)  No results found for this basename: GLUCAP:3 in the last 72 hours   Intake/Output Summary (Last 24 hours) at 10/26/11 1523 Last data filed at 10/26/11 0900  Gross per 24 hour  Intake    480 ml  Output   1150 ml  Net   -670 ml     Weight Status:  113.4 kg - no new wt available  Estimated needs:  2500 - 2800 kcal, 145 - 160 grams protein  Nutrition Dx:  Inadequate oral intake now r/t variable appetite AEB variable PO intake and need for supplements to meet kcal and protein goals. Improved.  Goal:  Pt to consume >/= 75% of estimated needs. Improving.  Intervention:  Continue current interventions.  Monitor:  Weights, labs, I/O's, PO intake  Adair Laundry Pager #:  [phone]

## 2011-10-26 NOTE — Progress Notes (Signed)
Occupational Therapy Session Note  Patient Details  Name: Mark [name] MRN: [id number] Date of Birth: 1966-06-20  Today's Date: 10/26/2011 Time: 1478-2956 Time Calculation (min): 60 min  Short Term Goals: Week 2:  OT Short Term Goal 1 (Week 2): Pt will follow one step commands with extra time with mod tactile cuing OT Short Term Goal 2 (Week 2): Pt will tolerate sitting EOB for 10 min A in prep for UB dressing OT Short Term Goal 3 (Week 2): Pt will dress UB with Mod A OT Short Term Goal 4 (Week 2): Pt will engage in a grooming task with right hand with mod A and initiation cues.  Skilled Therapeutic Interventions/Progress Updates:    1:1 self care retraining: bed mobility to come to EOB with max A (needing a for both LE and trunk), able to maintain balance in sitting with distant supervision, focus on trying to thread pants and push feet into shoes, secondary to abdominal pain unable to fasten shoe when attempted, used SARA PLUS (with foot plate) to perform sit to stand with even weight bearing for clothing management and then transition to w/c. Grooming at sink with tactile cuing for initiation and termination for tasks (brushing hair, teeth brushing, and washing face.)   Therapy Documentation Precautions:  Precautions Precautions: Fall Required Braces or Orthoses: Cervical Brace Cervical Brace: Hard collar;Applied in supine position Restrictions Weight Bearing Restrictions: No Pain:  c/o neck, abdomen, right knee and left shoulder discomfort with mobility- RN aware and gave meds, assisted with repositioning.  See FIM for current functional status  Therapy/Group: Individual Therapy  [name] [facility] 10/26/2011, 12:19 PM

## 2011-10-26 NOTE — Progress Notes (Signed)
Physical Therapy Note  Patient Details  Name: Mark [name] MRN: [id number] Date of Birth: 1965/12/20 Today's Date: 10/26/2011  Time: 1300-1400 60 minutes  Pt c/o pain in R knee with movement, positioning.  Pt treatment focused on standing in standing frame for LE strength, endurance, and upright posture.  Pt stood 10 minutes, 6 minutes with max assist for upright posture, max cues to attend to functional reaching task.  Pt became agitated during standing, cursing, yelling at therapists but when asked pt said he would like to remain standing and that it felt better than sitting.  Pt positioned in new w/c to improve postural alignment.  Attempted to engage pt in horseshoe game, pt unable to initiate or engage ? Due to fatigue after standing.  Pt is improving strength and activity tolerance and is consistently displaying agitated behaviors.  Individual therapy   Afton Lavalle 10/26/2011, 4:38 PM

## 2011-10-26 NOTE — Progress Notes (Signed)
Patient feeding self by grabbing food with right hand. Patient requires multiple cues to slow down feeding . Patient up for meals . Patient tolerating dys 3 thin liquids . Patient complained of right knee pain this afternoon and oxycodone given 5mg   With some relief . P. Love PA notified of patient's complaint and orders received. Continue with plan of care.                                                                                                                    Cleotilde Neer

## 2011-10-27 DIAGNOSIS — S069X9A Unspecified intracranial injury with loss of consciousness of unspecified duration, initial encounter: Secondary | ICD-10-CM

## 2011-10-27 DIAGNOSIS — Z5189 Encounter for other specified aftercare: Secondary | ICD-10-CM

## 2011-10-27 DIAGNOSIS — W320XXA Accidental handgun discharge, initial encounter: Secondary | ICD-10-CM

## 2011-10-27 LAB — VANCOMYCIN, TROUGH: Vancomycin Tr: 13.1 ug/mL (ref 10.0–20.0)

## 2011-10-27 MED ORDER — NITROFURANTOIN MONOHYD MACRO 100 MG PO CAPS
100.0000 mg | ORAL_CAPSULE | Freq: Two times a day (BID) | ORAL | Status: AC
  Administered 2011-10-27 – 2011-10-29 (×6): 100 mg via ORAL
  Filled 2011-10-27 (×7): qty 1
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
## 2011-10-27 NOTE — Progress Notes (Signed)
Occupational Therapy Session Note  Patient Details  Name: Mark [name] MRN: [id number] Date of Birth: 1965/12/08  Today's Date: 10/27/2011 Time: 0830-0930 Time Calculation (min): 60 min  Short Term Goals: Week 3:  OT Short Term Goal 1 (Week 3): Pt will follow one step command with mod cuing for a familar tasks OT Short Term Goal 2 (Week 3): Pt will A during a slide board transfer 25% including weight shift in functional transfer OT Short Term Goal 3 (Week 3): Pt will initiate tasks with min cuing 50% time OT Short Term Goal 4 (Week 3): Pt will use call bell 25% appropriately  OT Short Term Goal 5 (Week 3): Pt will demonstrate sustained attention for 3 min with functional and familiar task.  Skilled Therapeutic Interventions/Progress Updates:    1:1 self care retraining. Including getting to EOB for donning pants and shoes, pt grunting/ having a bowel movement without alerting OT in room of need to go, performed sit to stand with SARA Plus for hygiene after BM and for clothing management. Needed A to manage right LE to be underneath him in standing and for anterior weight shift with anterior pelvic tilt. Pt transferred into w/c with SARA. Pt then shared his feelings of sadness and that this OT would not understand why- made SW aware. Ace wrapped right knee for swelling and for comfort. Pt perform grooming tasks with active assist for left UE  And with max cuing for initiation. Engaged in game of Spot It- identifying matching images from a max distracting environment with mod A for attention to card before just saying "no"  Therapy Documentation Precautions:  Precautions Precautions: Fall Required Braces or Orthoses: Cervical Brace Cervical Brace: Hard collar;Applied in supine position Restrictions Weight Bearing Restrictions: No Pain: Pain Assessment Pain Assessment: 0-10 Pain Score:   9 Pain Type: Acute pain Pain Location: Knee Pain Orientation: Right Pain Descriptors:  Aching;Constant;Discomfort Pain Frequency: Constant Pain Onset: On-going Patients Stated Pain Goal: 3 Pain Intervention(s): Medication (See eMAR);Repositioned;Cold applied    See FIM for current functional status  Therapy/Group: Individual Therapy  [name] [facility] 10/27/2011, 9:44 AM
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
## 2011-10-27 NOTE — Progress Notes (Signed)
Speech Language Pathology Daily Session Note  Patient Details  Name: Mark [name] MRN: [id number] Date of Birth: [birth year]  Today's Date: 10/27/2011 Time: 0930-1100 Time Calculation (min): 90 min  Short Term Goals:  SLP Short Term Goal 1 (Week 3): Pt will initiate self-feeding task with Min A verbal and visual cues. SLP Short Term Goal 2 (Week 3): Pt will demonstrate sustained attention during a functional task for ~2 minutes with Max A verbal cues.  SLP Short Term Goal 3 (Week 3): Pt will verbally express wants and needs with Mod A questioning cues.  SLP Short Term Goal 4 (Week 3): Pt will demonstrate intellectual awareness with Mod A questioning and semantic cues.   Skilled Therapeutic Interventions: Treatment focus on verbal expression, initiation, sustained attention and purposeful behavior. Pt appears to be a little more "down" today and reported that he felt "sad." Pt required Mod verbal cues to initiate self-feeding with using a utensil with Max A tactile cues.  Total A to decrease impulsivity/perseveration with meal by removing utensil from pt's hand. Pt then utilized his hands and tray had to be removed from pt's reach. Sustained attention to task with Max A verbal and tactile cues. Pt with decreased verbal expression throughout session and required Max verbal cues to engage/participate with multiple tasks. Matched pictures from a Max distracting visual field with Mod verbal cues.    Daily Session FIM:  Comprehension Comprehension Mode: Auditory Comprehension: 3-Understands basic 50 - 74% of the time/requires cueing 25 - 50%  of the time Expression Expression Mode: Verbal Expression: 2-Expresses basic 25 - 49% of the time/requires cueing 50 - 75% of the time. Uses single words/gestures. Social Interaction Social Interaction: 2-Interacts appropriately 25 - 49% of time - Needs frequent redirection. Problem Solving Problem Solving: 1-Solves basic less than 25% of the time - needs  direction nearly all the time or does not effectively solve problems and may need a restraint for safety Memory Memory: 1-Recognizes or recalls less than 25% of the time/requires cueing greater than 75% of the time FIM - Eating Eating Activity: 4: Helper occasionally scoops food on utensil Pain Pain Assessment Pain Assessment: 0-10 Pain Score:   4 Pain Type: Acute pain Pain Location: Knee Pain Orientation: Right Pain Descriptors: Aching Pain Onset: On-going Patients Stated Pain Goal: 3 Pain Intervention(s): Repositioned  Therapy/Group: Individual Therapy  Breeanna Galgano 10/27/2011, 3:42 PM
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
## 2011-10-28 DIAGNOSIS — S069X9A Unspecified intracranial injury with loss of consciousness of unspecified duration, initial encounter: Secondary | ICD-10-CM

## 2011-10-28 DIAGNOSIS — Z5189 Encounter for other specified aftercare: Secondary | ICD-10-CM

## 2011-10-28 DIAGNOSIS — W320XXA Accidental handgun discharge, initial encounter: Secondary | ICD-10-CM

## 2011-10-28 NOTE — Progress Notes (Signed)
Physical Therapy Note  Patient Details  Name: Mark [name] MRN: [id number] Date of Birth: Jul 03, 1965 Today's Date: 10/28/2011  Time: 1430-1530 60 minutes  Pt c/o pain in L shoulder with extension, relieved by repositioning. Sliding board transfer +2 total assist w/c<>mat with pt <10% participating, increased ability to maintain forward trunk lean with manual facilitation/verbal cues.  Stood 3-Musketeers style with +2 mod A; pt had difficulty bringing weight forward with posterior trunk lean but was able to achieve upright posture up to 30 sec.  Pt requested to return to room; transferred to bed with +2 total A sliding board transfer.  Asked patient to initiate sit>sup transfer and pt stated that he did not want to lay down.  Waited for patient to initiate x3 min, pt distracted by skin on hands and TED hose. Pt able to apply lotion to legs and hands with min cuing.  Pt transferred with +2 total A to supine, repositioned with +2 total A.  Soft tissue mobilization to cervical paraspinals for muscle relaxation and decreased pain secondary to c/o soreness in back of neck.  Pt had increased participation today with improved initiation, but continues to show minimal participation for functional mobility.    Individual therapy  Lockie Pares 10/28/2011, 3:42 PM

## 2011-10-28 NOTE — Progress Notes (Signed)
Occupational Therapy Session Note  Patient Details  Name: Mark [name] MRN: [id number] Date of Birth: Oct 20, 1965  Today's Date: 10/28/2011 Time: 0830-0930 Time Calculation (min): 60 min  Short Term Goals: Week 3:  OT Short Term Goal 1 (Week 3): Pt will follow one step command with mod cuing for a familar tasks OT Short Term Goal 2 (Week 3): Pt will A during a slide board transfer 25% including weight shift in functional transfer OT Short Term Goal 3 (Week 3): Pt will initiate tasks with min cuing 50% time OT Short Term Goal 4 (Week 3): Pt will use call bell 25% appropriately  OT Short Term Goal 5 (Week 3): Pt will demonstrate sustained attention for 3 min with functional and familiar task.  Skilled Therapeutic Interventions/Progress Updates:    1:1 self care retraining. Bed mobility to get to EOB with max A, pt had been incontinent of BM. Focus on sit to stand, standing balance (with A for positioning of right LE/ foot) for hygiene and clothing management with SARA PLUs. Pt demonstrating increased weight bearing through left LE with knee support. Pt wanted to sit on toilet to try to continue to void, used SARA PLUS to transfer. Pt continues to voice his frustration with current situation. Pt demonstrated increased initiation with automatic tasks with tactile cues ie donning shirt, preparing to stand.  Therapy Documentation Precautions:  Precautions Precautions: Fall Required Braces or Orthoses: Cervical Brace Cervical Brace: Hard collar;Applied in supine position Restrictions Weight Bearing Restrictions: No Pain: Pain Assessment Pain Assessment: 0-10 Pain Score: 10-Worst pain ever Pain Type: Acute pain Pain Location: Head Pain Intervention(s): RN made aware Multiple Pain Sites: No  See FIM for current functional status  Therapy/Group: Individual Therapy  [name] [facility] 10/28/2011, 12:17 PM
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
## 2011-10-28 NOTE — Plan of Care (Signed)
Problem: RH KNOWLEDGE DEFICIT Goal: RH STG INCREASE KNOWLEDGE OF HYPERTENSION Patient and mother will be able to verbalize understanding of signs and symptoms of hypertension.  Outcome: Progressing mother Goal: RH STG INCREASE KNOWLEDGE OF DYSPHAGIA/FLUID INTAKE Patient and mother will be able to verbalize understanding of dysphasia .  Outcome: Progressing mother

## 2011-10-28 NOTE — Progress Notes (Signed)
Pts foley d/c'd at 0900; incontinent lg. amt urine at 1230; scanned for 11cc. Pt stated he was aware of episode, had urge and awareness but "too fast". Initiate timed toileting,offer urinal. Oxy IR effective for c/o headache and rt sided neck pain. K-pad to neck as well, repositioned. Carlean Purl
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
## 2011-10-28 NOTE — Progress Notes (Signed)
Speech Language Pathology Daily Session Note  Patient Details  Name: Mark [name] MRN: [id number] Date of Birth: 1966-01-06  Today's Date: 10/28/2011 Time: 0930-1030 Time Calculation (min): 60 min  Short Term Goals: Week 3:  SLP Short Term Goal 1 (Week 3): Pt will initiate self-feeding task with Min A verbal and visual cues. SLP Short Term Goal 2 (Week 3): Pt will demonstrate sustained attention during a functional task for ~2 minutes with Max A verbal cues.  SLP Short Term Goal 3 (Week 3): Pt will verbally express wants and needs with Mod A questioning cues.  SLP Short Term Goal 4 (Week 3): Pt will demonstrate intellectual awareness with Mod A questioning and semantic cues.   Skilled Therapeutic Interventions:  Treatment focused on functional sustained attention in self-feeding task with breakfast tray, requiring moderate verbal/tactile/visual cues to inhibit impulsive self-feeding and complete chew/swallow sequence before the next bite.  Occasional throat clearing throughout meal.  Patient did verbally respond to approximately 80% of SLP's questions regarding biographical information and current, immediate environment at 2-4 word phrasal level with minimal verbal prompts and evidence of a continued hoarse vocal quality.  Patient verbalized "almost depressed" when asked how he was feeling today.  Photo card sequencing task in a field of 3-4 with moderate semantic cues, patient able to consistently and accurately identify the first and last step in every sequence.  FIM:  Comprehension Comprehension Mode: Auditory Comprehension: 3-Understands basic 50 - 74% of the time/requires cueing 25 - 50%  of the time Expression Expression Mode: Verbal Expression: 2-Expresses basic 25 - 49% of the time/requires cueing 50 - 75% of the time. Uses single words/gestures. Social Interaction Social Interaction: 2-Interacts appropriately 25 - 49% of time - Needs frequent redirection. Problem Solving Problem  Solving: 2-Solves basic 25 - 49% of the time - needs direction more than half the time to initiate, plan or complete simple activities Memory Memory: 1-Recognizes or recalls less than 25% of the time/requires cueing greater than 75% of the time FIM - Eating Eating Activity: 5: Needs verbal cues/supervision  Pain Pain Assessment Pain Assessment: 0-10 Pain Score: 10-Worst pain ever Pain Type: Acute pain Pain Location: Head Pain Intervention(s): RN made aware Multiple Pain Sites: No  Therapy/Group: Individual Therapy  Myra Rude, M.S.,CCC-SLP Pager [phone] 10/28/2011, 10:48 AM
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
## 2011-10-28 NOTE — Progress Notes (Signed)
Physical Therapy Note  Patient Details  Name: Tynell Winchell MRN: [id number] Date of Birth: 09-03-1965 Today's Date: 10/28/2011  Time: 1055-1150 55 minutes  C/o L shoulder pain with elevation; relieved with repositioning.  Sliding board transfer to mat with +2 total assist, pt initiating coming forward 10% of time, continues to have posterior lean despite verbal cuing/manual facilitation.  Pt able to read directions and use L hand to hold objects for R hand to manipulate with hand over hand assist to place (making pudding).  Required mod cuing/encouragement to continue with stirring to make pudding, attention <30sec.  Standing three-musketeers style x3 attempts up to 45 sec; pt stood with +2 mod A, manual facilitation at quads/glutes for activation.  Attempted to slide feet on floor with manual assist; pt unable to achieve even stance due to R LE extensor tone.  Pt fatigued quickly and required max A x 2 on last attempt to stand.  UE ergometer x3 min with B UEs, hand over hand assist to maintain L hand on grip; +1 min with R UE only.  Pt declined returning to bed afterwards, left seated at nursing station.  Pt occasionally agitated, saying "Stop saying that, I don't understand why we're doing this"; explained to patient the need for therapy to improve functional mobility.  Pt had increased participation today throughout entire 55 minutes of therapy.  Individual therapy  Lockie Pares 10/28/2011, 11:52 AM

## 2011-10-28 NOTE — Care Management Note (Signed)
Patient ID: Mark [name], male   DOB: 12-04-1965, 46 y.o.   MRN: [id number] Mother made aware of decrease  ELOS 4 weeks.   Per [name], LCSW, she, pt's mother & brother to [facility] w/ pt tomorrow to answer his questions about what has happened to him.

## 2011-10-29 MED ORDER — CITALOPRAM HYDROBROMIDE 40 MG PO TABS
40.0000 mg | ORAL_TABLET | Freq: Every day | ORAL | Status: DC
  Administered 2011-10-29 – 2011-11-18 (×20): 40 mg via ORAL
  Filled 2011-10-29 (×26): qty 1

## 2011-10-29 NOTE — Progress Notes (Signed)
Speech Language Pathology Daily Session Note  Patient Details  Name: Mark [name] MRN: [id number] Date of Birth: 19-Jun-1966  Today's Date: 10/29/2011 Time: 5956-3875 Time Calculation (min): 60 min  Short Term Goals:  SLP Short Term Goal 1 (Week 3): Pt will initiate self-feeding task with Min A verbal and visual cues. SLP Short Term Goal 2 (Week 3): Pt will demonstrate sustained attention during a functional task for ~2 minutes with Max A verbal cues.  SLP Short Term Goal 3 (Week 3): Pt will verbally express wants and needs with Mod A questioning cues.  SLP Short Term Goal 4 (Week 3): Pt will demonstrate intellectual awareness with Mod A questioning and semantic cues.   Skilled Therapeutic Interventions: Treatment focus on functional communication, verbal expression, sequencing and problem solving. Pt required Max A verbal, semantic and visual cues for redirection to task. Max A verbal and semantic cues for sequencing and problem solving while making a calendar. Pt is going to participate in a cooking task next week and  generated a list of ingredients and recipe with Max A semantic and verbal cues. Pt independently reporting pain in back, clinician repositioned. Pt demonstrated increased social interaction and engagement.   Daily Session FIM:  Comprehension Comprehension Mode: Auditory Comprehension: 3-Understands basic 50 - 74% of the time/requires cueing 25 - 50%  of the time Expression Expression Mode: Verbal Expression: 2-Expresses basic 25 - 49% of the time/requires cueing 50 - 75% of the time. Uses single words/gestures. Social Interaction Social Interaction: 2-Interacts appropriately 25 - 49% of time - Needs frequent redirection. Problem Solving Problem Solving: 2-Solves basic 25 - 49% of the time - needs direction more than half the time to initiate, plan or complete simple activities Memory Memory: 1-Recognizes or recalls less than 25% of the time/requires cueing greater than 75%  of the time FIM - Eating Eating Activity: 5: Needs verbal cues/supervision Pain Pain Assessment Pain Assessment: No/denies pain  Therapy/Group: Individual Therapy  Shantella Blubaugh 10/29/2011, 4:17 PM

## 2011-10-29 NOTE — Progress Notes (Signed)
Physical Therapy Note  Patient Details  Name: Mark [name] MRN: [id number] Date of Birth: 08-25-1965 Today's Date: 10/29/2011  Time: 1130-1200 30 minutes  No c/o pain.  Practiced standing in parallel bars x3 attempts with +3 max assist.  Pt stood up to 45 sec with mod-max A, able to achieve and maintain fully upright posture for up to 10 sec.  Pt unable to shift weight for taking steps.  Noted pt had bowel movement in chair; pt returned to bed using MaxiMove lift.  When questioned pt stated that he could tell he needed to use the bathroom prior to having a bowel movement.  Patient required mod A for selective attention in busy environment but demonstrated best standing tolerance to date.     Individual therapy  Lockie Pares 10/29/2011, 12:18 PM
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
## 2011-10-29 NOTE — Progress Notes (Signed)
Recreational Therapy Session Note  Patient Details  Name: Mark [name] MRN: [id number] Date of Birth: [birth year] Today's Date: 10/29/2011 Time: 1445-1500 Pain: no c/o Skilled Therapeutic Interventions/Progress Updates: Pt maintained dynamic sitting balance EOB with feet unsupported for Animal Assisted Therapy(AAT).  Upon entering the room with therapy dog, pt initiated sitting more upright and began smiling.  Pt with increased verbalizations which he initiated today during AAT.  Pt sat EOB ~10 minutes using LUE to pet the dog.  Therapy/Group: Co-Treatment- AAT  Activity Level:moderate Level of assist: Min Assist  Salik Grewell 10/29/2011, 4:22 PM

## 2011-10-29 NOTE — Progress Notes (Signed)
Occupational Therapy Session Note  Patient Details  Name: Redmond Whittley MRN: [id number] Date of Birth: May 29, 1966  Today's Date: 10/29/2011 Time: 0830-0930 Time Calculation (min): 60 min  Short Term Goals: Week 3:  OT Short Term Goal 1 (Week 3): Pt will follow one step command with mod cuing for a familar tasks OT Short Term Goal 2 (Week 3): Pt will A during a slide board transfer 25% including weight shift in functional transfer OT Short Term Goal 3 (Week 3): Pt will initiate tasks with min cuing 50% time OT Short Term Goal 4 (Week 3): Pt will use call bell 25% appropriately  OT Short Term Goal 5 (Week 3): Pt will demonstrate sustained attention for 3 min with functional and familiar task.  Skilled Therapeutic Interventions/Progress Updates:    1:1 self care retraining at EOB, focus on bed mobility, attempted to perform sit to stands 3 musketeers (with +2) but pt resisting standing nor performing upright posture- in order to perform clothing management. Pt resistant to trying first and not saying "no." With extra time pt able to initiate with simple tasks but needed A complete all tasks.   Therapy Documentation Precautions:  Precautions Precautions: Fall Required Braces or Orthoses: Cervical Brace Cervical Brace: Hard collar;Applied in supine position Restrictions Weight Bearing Restrictions: No Pain:  right LE discomfort with any flexion  See FIM for current functional status  Therapy/Group: Individual Therapy  [name] [facility] 10/29/2011, 3:24 PM

## 2011-10-29 NOTE — Progress Notes (Signed)
Physical Therapy Session Note  Patient Details  Name: Belton Peplinski MRN: [id number] Date of Birth: 1965-06-30  Today's Date: 10/29/2011 Time: 1100-1135 Time Calculation (min): 35 min  Short Term Goals: Week 3:  PT Short Term Goal 1 (Week 3): pt will attend to functional task x1 minute with min cuing consistently PT Short Term Goal 2 (Week 3): pt will tolerate standing upright in Sara+ x2 minutes with min A at trunk/hips PT Short Term Goal 3 (Week 3): initiate forward wt shift to assist with transfers 50 % of the time  Skilled Therapeutic Interventions/Progress Updates:    worked on forward weight shifting in preparation for sit to stand with assistance of friend- pt leaning forward to hit ball with bat, leaning forward to use elastic resistance band, unable to get pt to weight shift hips reciprocally forward Pain- no c/o pain Therapy Documentation Precautions:  Precautions Precautions: Fall Required Braces or Orthoses: Cervical Brace Cervical Brace: Hard collar;Applied in supine position Restrictions Weight Bearing Restrictions: No Therapy/Group: Individual Therapy  Michaelene Song 10/29/2011, 11:42 AM

## 2011-10-29 NOTE — Progress Notes (Signed)
Physical Therapy Note  Patient Details  Name: Mark [name] MRN: [id number] Date of Birth: [birth year] Today's Date: 10/29/2011  Time: 1430-1530 60 minutes  No c/o pain.  Pt required mod A for sup>sit transfer for B LE management and only min A at trunk.  Pt sat EOB with feet unsupported x10 minutes with encouragement to use B UEs to pat therapy dog with recreational therapy.  Sliding board transfer to w/c with +2 total A, pt 10% assist with encouragement to place R hand on w/c.  Pt propelled w/c x100' with max A, hand over hand A on L and max A for steering. Sliding board transfers with +2 total A to mat and to tilt-in-space w/c as pt is unsafe to remain in manual w/c at this time.  Pt continues to be limited by decreased attention, internal distractions, decreased initiation.  Individual therapy  Lockie Pares 10/29/2011, 3:46 PM

## 2011-10-30 NOTE — Progress Notes (Signed)
Social Work Patient ID: Mark [name], male   DOB: 1966-05-28, 46 y.o.   MRN: [id number]  Met yesterday afternoon with pt's brother and mother - Had originally planned that I would meet with them and with patient together to speak with patient about his suicide attempt in order to determine his awareness of event and what emotional supports would be needed.   As I met initially with family, brother reports that he had already spoken with patient earlier in the afternoon and that pt had confirmed with him that he was aware that he had "done this to himself".  Brother feels confident that pt is aware that he made an suicide attempt despite having no recall of the actual event.  Brother and mother agreeable that staff can certainly continue to discuss this with patient as appropriate and as prompted by patient.  I will continue to follow patient for emotional adjustment needs and will discuss case with Dr. [name] as well.  Patient already on Celexa - family aware and is in agreement for staff to offer any support/ intervention they feel necessary.  Karilyn Wind
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
## 2011-10-30 NOTE — Plan of Care (Signed)
Problem: RH PAIN MANAGEMENT Goal: RH STG PAIN MANAGED AT OR BELOW PT'S PAIN GOAL Pain goal <2 on scale 0-10 Pt able to state when he needs pain meds and pain is managed with current regimen

## 2011-10-30 NOTE — Progress Notes (Signed)
Physical Therapy Weekly Progress Note  Patient Details  Name: Elon Eoff MRN: [id number] Date of Birth: 1965/07/20  Today's Date: 10/30/2011  Patient has met 1 of 3 short term goals.  He has improved forward trunk lean for sliding board transfers but continues to be total assist for all mobility.  Pt's progress is limited by decreased initiation, decreased attention, decreased awareness, increased pain, decreased strength.  Patient continues to demonstrate the following deficits: decreased initiation, decreased attention, decreased awareness, increased pain, decreased strength and therefore will continue to benefit from skilled PT intervention to enhance overall performance with activity tolerance, balance, postural control, ability to compensate for deficits, functional use of  left upper extremity and left lower extremity, attention, awareness and coordination.  Patient progressing toward long term goals..  Continue plan of care.  PT Short Term Goals Week 3:  PT Short Term Goal 1 (Week 3): pt will attend to functional task x1 minute with min cuing consistently PT Short Term Goal 1 - Progress (Week 3): Progressing toward goal PT Short Term Goal 2 (Week 3): pt will tolerate standing upright in Sara+ x2 minutes with min A at trunk/hips PT Short Term Goal 2 - Progress (Week 3): Progressing toward goal PT Short Term Goal 3 (Week 3): initiate forward wt shift to assist with transfers 50 % of the time PT Short Term Goal 3 - Progress (Week 3): Met Week 4:  PT Short Term Goal 1 (Week 4): Pt will attend to functional task x 1 minute with min cuing PT Short Term Goal 2 (Week 4): Pt will assist with bed <> chair 60 % of time PT Short Term Goal 3 (Week 4): Pt will roll in bed with mod A  Skilled Therapeutic Interventions/Progress Updates:  Ambulation/gait training;Balance/vestibular training;Cognitive remediation/compensation;Discharge planning;DME/adaptive equipment instruction;Functional mobility  training;Patient/family education;Pain management;Neuromuscular re-education;Stair training;Splinting/orthotics;Therapeutic Activities;UE/LE Strength taining/ROM;Therapeutic Exercise;UE/LE Coordination activities;Wheelchair propulsion/positioning    See FIM for current functional status    Ronzell Laban 10/30/2011, 4:07 PM

## 2011-10-30 NOTE — Progress Notes (Signed)
Speech Language Pathology Daily Session Note  Patient Details  Name: Mark [name] MRN: [id number] Date of Birth: 1965-07-10  Today's Date: 10/30/2011 Time: 1530-1600 Time Calculation (min): 30 min  Short Term Goals: Week 4: SLP Short Term Goal 1 (Week 4): Pt will intiate self-feeding with supervision verbal cues SLP Short Term Goal 2 (Week 4): Pt demonstrated sustained attention to a functional task for ~ 2 minutes with Mod A SLP Short Term Goal 3 (Week 4): pt will verball express wants/needs with Mod A semantic cues SLP Short Term Goal 4 (Week 4): Pt will identify 1 physical and 1 cognitive function with Max semantic cues.   Skilled Therapeutic Interventions: Treatment focus on functional communication, verbal expression, initiation of requests for help as needed with basic problem solving. Pt required Max A verbal, semantic cues to initiate verbal requests with spontaneous request x1. Pt demonstrated increased social interaction and engagement.   Daily Session Pain Pain Assessment Pain Assessment: No/denies pain Pain Score: 0-No pain Pain Type: Acute pain Pain Location: Neck Pain Descriptors: Aching  Therapy/Group: Individual Therapy  Charlane Ferretti., CCC-SLP [phone]  [name] 10/30/2011, 5:27 PM

## 2011-10-30 NOTE — Progress Notes (Signed)
Physical Therapy Note  Patient Details  Name: Mark [name] MRN: [id number] Date of Birth: February 26, 1966 Today's Date: 10/30/2011  Time: 1400-1500 60 minutes  Pt c/o pain in L shoulder with elevation; relieved with rest/repositioning.  Pt performed bed mobility with mod A for B LE management, min A at trunk with HOB elevated.  Sit<>stand transfers 3 musketeers style with +2 total A and MaxiSky lift.  Pt sat on blue Swiss ball x10 min with mod- total A for balance at pelvis and L LE, reaching with R and L hand within BOS.  Pelvic shifting to promote initiation of movement at hips with total A.  Sliding board transfer bed>w/c with +2 total A.  UBE x6 min with B UEs, hand over hand assist on L to maintain grip on handle.  Pt continues to show minimal initiation with transfers, perseveration and difficulty with terminating movement.    Individual therapy  Lockie Pares 10/30/2011, 3:49 PM

## 2011-10-30 NOTE — Plan of Care (Signed)
Problem: RH BOWEL ELIMINATION Goal: RH STG MANAGE BOWEL WITH ASSISTANCE STG Manage Bowel with Mod. Assistance.  Outcome: Not Progressing Pt frequently incont of bowel at this time

## 2011-10-30 NOTE — Progress Notes (Signed)
Physical Therapy Note  Patient Details  Name: Macari Zalesky MRN: [id number] Date of Birth: 12/28/1965 Today's Date: 10/30/2011  Time: 1100-1200 60 minutes  Pt c/o pain in R LE with activity; RN aware.  Pt stood 3 musketeers style with +2 total A.  Ambulation in MaxiSki lift, +2 A with Eva walker 2x5', total A for advancement of L LE but pt able to initiate advancement of R LE.  Assist at trunk for weight shifting at pelvis, upright posture. Pt returned to bed with sliding board transfer +2 total A, +2 total A for sit>sup transfer and rolling.  Pt able to assist with repositioning in bed with R UE placed on headboard, mod A for repositioning.  Pt demonstrated good initiation with advancement of R LE but continues to show little to no initiation with transfers.  Individual therapy    Lockie Pares 10/30/2011, 12:15 PM

## 2011-10-30 NOTE — Progress Notes (Signed)
Speech Language Pathology Weekly Progress Note  Patient Details  Name: Mark [name] MRN: [id number] Date of Birth: [birth year]  Today's Date: 10/30/2011 Time: 1400-1455 Time Calculation (min): 55 min  Short Term Goals:  SLP Short Term Goal 1 (Week 3): Pt will initiate self-feeding task with Min A verbal and visual cues. SLP Short Term Goal 1 - Progress (Week 3): Met SLP Short Term Goal 2 (Week 3): Pt will demonstrate sustained attention during a functional task for ~2 minutes with Max A verbal cues.  SLP Short Term Goal 2 - Progress (Week 3): Met SLP Short Term Goal 3 (Week 3): Pt will verbally express wants and needs with Mod A questioning cues.  SLP Short Term Goal 3 - Progress (Week 3): Not met SLP Short Term Goal 4 (Week 3): Pt will demonstrate intellectual awareness with Mod A questioning and semantic cues.  SLP Short Term Goal 4 - Progress (Week 3): Not met  New Short Term Goals:  SLP Short Term Goal 1 (Week 4): Pt will intiate self-feeding with supervision verbal cues SLP Short Term Goal 2 (Week 4): Pt demonstrated sustained attention to a functional task for ~ 2 minutes with Mod A SLP Short Term Goal 3 (Week 4): pt will verball express wants/needs with Mod A semantic cues SLP Short Term Goal 4 (Week 4): Pt will identify 1 physical and 1 cognitive function with Max semantic cues.   Weekly Progress Updates: Pt has made some functional gains and has met 2 of 4 STG's this reporting period. Currently, pt is overall Max A for sustained attention, verbal expression of wants/needs, functional problem solving and working memory. Pt will initiate self-feeding with Min verbal and visual cues. Pt requires Min-Mod verbal and tactile cues to monitor and correct impulsivity with bite size and rate with regular textures and thin liquids. Pt's verbal expression/ social interaction impacted by pt's mood/reports of "sadness" and "depression." Pt/family education ongoing. Pt would benefit from continued  skilled SLP services to maximize overall cognitive function and independence.   Daily Session Skilled Therapeutic Intervention: Treatment focus on initiation, problem solving, sequencing, and written expression. Pt initiated meal independently with his hands, however, required Min verbal and visual cues to utilize utensils. SLP facilitated written expression by having pt write a list of ingredients from his mom for a specific meal. Pt required Max A verbal and visual cues to self-monitor spelling errors and visual-spatial errors. Total A for sequencing steps for preparing meal.  FIM:  Comprehension Comprehension Mode: Auditory Comprehension: 3-Understands basic 50 - 74% of the time/requires cueing 25 - 50%  of the time Expression Expression Mode: Verbal Expression: 2-Expresses basic 25 - 49% of the time/requires cueing 50 - 75% of the time. Uses single words/gestures. Problem Solving Problem Solving: 2-Solves basic 25 - 49% of the time - needs direction more than half the time to initiate, plan or complete simple activities Memory Memory: 2-Recognizes or recalls 25 - 49% of the time/requires cueing 51 - 75% of the time FIM - Eating Eating Activity: 5: Supervision/cues Pain Pain Assessment Pain Assessment: PAINAD Pain Type: Acute pain Pain Location: Neck Pain Descriptors: Aching  Therapy/Group: Individual Therapy  [name] 10/30/2011, 3:30 PM

## 2011-10-30 NOTE — Progress Notes (Signed)
Occupational Therapy Session Note  Patient Details  Name: Mark [name] MRN: [id number] Date of Birth: 1965-07-10  Today's Date: 10/30/2011  Pt missed 60 min of OT secondary to fatigue/ no sleep last night per mom and pt heavily sleeping now. Allow rest now for better performance in morning therapies. Mom agrees to this  [name] [facility] 10/30/2011, 8:32 AM

## 2011-10-31 DIAGNOSIS — W320XXA Accidental handgun discharge, initial encounter: Secondary | ICD-10-CM

## 2011-10-31 DIAGNOSIS — S069X9A Unspecified intracranial injury with loss of consciousness of unspecified duration, initial encounter: Secondary | ICD-10-CM

## 2011-10-31 DIAGNOSIS — Z5189 Encounter for other specified aftercare: Secondary | ICD-10-CM

## 2011-10-31 MED ORDER — POLYVINYL ALCOHOL 1.4 % OP SOLN
1.0000 [drp] | OPHTHALMIC | Status: DC | PRN
  Administered 2011-11-04 – 2011-11-05 (×2): 1 [drp] via OPHTHALMIC
  Filled 2011-10-31 (×2): qty 15

## 2011-10-31 NOTE — Progress Notes (Signed)
Physical Therapy Session Note  Patient Details  Name: Kip Cropp MRN: [id number] Date of Birth: 1966/04/25  Today's Date: 10/31/2011 Time: 1030-1100 Time Calculation (min): 30 min  Short Term Goals: Week 3: PT Short Term Goal 1 (Week 3): pt will attend to functional task x1 minute with min cuing consistently  PT Short Term Goal 1 - Progress (Week 3): Progressing toward goal  PT Short Term Goal 2 (Week 3): pt will tolerate standing upright in Sara+ x2 minutes with min A at trunk/hips  PT Short Term Goal 2 - Progress (Week 3): Progressing toward goal  PT Short Term Goal 3 (Week 3): initiate forward wt shift to assist with transfers 50 % of the time  PT Short Term Goal 3 - Progress (Week 3): Met  Week 4: PT Short Term Goal 1 (Week 4): Pt will attend to functional task x 1 minute with min cuing  PT Short Term Goal 2 (Week 4): Pt will assist with bed <> chair 60 % of time  PT Short Term Goal 3 (Week 4): Pt will roll in bed with mod A   Skilled Therapeutic Interventions/Progress Updates:   Therapy Documentation  Pain: Reports upper back/neck pain 9/10.... Nursing notified and received medications during therapy session.  Therapeutic Activity (15') dynamic sitting balance and trunk control, repositioning in w/c Exercises: Therapeutic Exercise (15') B LE's AROM/AAROM/PROM in sitting  Other Treatments: Family training (mother) for positioning with protection/padding on back of Right lower leg when resting over foot rest of w/c  See FIM for current functional status  Therapy/Group: Individual Therapy  Kee Drudge J 10/31/2011, 10:39 AM

## 2011-10-31 NOTE — Progress Notes (Signed)
Speech Language Pathology Daily Session Note  Patient Details  Name: Mark [name] MRN: [id number] Date of Birth: 1966-05-31  Today's Date: 10/31/2011 Time: 0700-0730 Time Calculation (min): 30 min  Short Term Goals: Week 4:  SLP Short Term Goal 1 (Week 4): Pt will intiate self-feeding with supervision verbal cues SLP Short Term Goal 2 (Week 4): Pt demonstrated sustained attention to a functional task for ~ 2 minutes with Mod A SLP Short Term Goal 3 (Week 4): pt will verball express wants/needs with Mod A semantic cues SLP Short Term Goal 4 (Week 4): Pt will identify 1 physical and 1 cognitive function with Max semantic cues.   Skilled Therapeutic Interventions:  Treatment focused on functional writing of biographical information (name, address, phone number) with max verbal cues to sustain attention, initiate and engage.  Appeared behavioral as patient not responsive to any SLP yes/no questions until ending the session was offered and he consistently nodded "yes" to do so.  Mother present who voiced her concerns over early morning session time (0700) and said she would speak with his treatment team to ensure he not start any therapies until 0800.     FIM:  Comprehension Comprehension Mode: Auditory Comprehension: 2-Understands basic 25 - 49% of the time/requires cueing 51 - 75% of the time Expression Expression Mode: Verbal Expression: 2-Expresses basic 25 - 49% of the time/requires cueing 50 - 75% of the time. Uses single words/gestures. Social Interaction Social Interaction: 2-Interacts appropriately 25 - 49% of time - Needs frequent redirection. Problem Solving Problem Solving: 2-Solves basic 25 - 49% of the time - needs direction more than half the time to initiate, plan or complete simple activities Memory Memory: 2-Recognizes or recalls 25 - 49% of the time/requires cueing 51 - 75% of the time   Pain Pain Assessment Pain Assessment: No/denies pain  Therapy/Group: Individual  Therapy  Myra Rude, M.S.,CCC-SLP Pager [phone] 10/31/2011, 7:32 AM
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
## 2011-10-31 NOTE — Progress Notes (Signed)
Patient alert to self, Patient received tramadol this morning for back pain with some relief. Patient reported some throbbing pain in right eye. Dr Wynn Banker notified. New orders received. Upon recheck on patient- Patient denied eye pain. Patient tolerating meals well. Patient up in chair for a few hours today and tolerated well. Patient incontinent x3. Patient unable to vocalize when need to go. Will try to time toilet with urinal. Continue with plan of care.

## 2011-11-01 NOTE — Plan of Care (Signed)
Problem: RH BOWEL ELIMINATION Goal: RH STG MANAGE BOWEL WITH ASSISTANCE STG Manage Bowel with Mod. Assistance.  Outcome: Not Progressing Patient continent only with brief. Patient unable to vocalize when need to have bowel movement

## 2011-11-01 NOTE — Progress Notes (Signed)
Occupational Therapy Session Note  Patient Details  Name: Mark [name] MRN: [id number] Date of Birth: Mar 29, 1966  Today's Date: 11/01/2011 Time: 1000-1045 Time Calculation (min): 45 min  Skilled Therapeutic Interventions/Progress Updates: scheduled for dressing therapy but only participated for assisting to don shirt;  Maxi Sky used to [employer] patient to bed and back to his chair to complete LB dressing     Therapy Documentation  Pain:  Per report both hands no rating given    See FIM for current functional status  Therapy/Group: Individual Therapy  Bud Face [facility] 11/01/2011, 4:23 PM
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
## 2011-11-02 DIAGNOSIS — R259 Unspecified abnormal involuntary movements: Secondary | ICD-10-CM

## 2011-11-02 DIAGNOSIS — W320XXA Accidental handgun discharge, initial encounter: Secondary | ICD-10-CM

## 2011-11-02 DIAGNOSIS — Z5189 Encounter for other specified aftercare: Secondary | ICD-10-CM

## 2011-11-02 DIAGNOSIS — M1710 Unilateral primary osteoarthritis, unspecified knee: Secondary | ICD-10-CM

## 2011-11-02 DIAGNOSIS — S069X9A Unspecified intracranial injury with loss of consciousness of unspecified duration, initial encounter: Secondary | ICD-10-CM

## 2011-11-02 NOTE — Progress Notes (Signed)
Speech Language Pathology Daily Session Note  Patient Details  Name: Mark [name] MRN: [id number] Date of Birth: Sep 03, 1965  Today's Date: 11/02/2011 Time: 4098-1191 Time Calculation (min): 60 min  Short Term Goals: SLP Short Term Goal 1 (Week 4): Pt will intiate self-feeding with supervision verbal cues SLP Short Term Goal 2 (Week 4): Pt demonstrated sustained attention to a functional task for ~ 2 minutes with Mod A SLP Short Term Goal 3 (Week 4): pt will verball express wants/needs with Mod A semantic cues SLP Short Term Goal 4 (Week 4): Pt will identify 1 physical and 1 cognitive function with Max semantic cues.   Skilled Therapeutic Interventions: Treatment focus on initiation, problem solving and verbal expression. Pt required hand over hand assist to utilize BUE's to open containers. Pt initiated self-feeding with Min verbal and visual cues. Pt required total A to utilize an increased vocal quality at the word level. Pt with limited verbal expression/engagement with clinician. Pt reported something was bothering him that "was complicated and that I wouldn't understand." He also reported he wanted to speak to his mother about it. Pt redirected to functional task with limited engagement. Pt was total A for counting change and sustained attention to task.  Pt required Mod A verbal and visual cues to utilize BUE's to donn lotion on hands.   Daily Session FIM:  Comprehension Comprehension Mode: Auditory Comprehension: 3-Understands basic 50 - 74% of the time/requires cueing 25 - 50%  of the time Expression Expression Mode: Verbal Expression: 2-Expresses basic 25 - 49% of the time/requires cueing 50 - 75% of the time. Uses single words/gestures. Social Interaction Social Interaction: 2-Interacts appropriately 25 - 49% of time - Needs frequent redirection. Problem Solving Problem Solving: 1-Solves basic less than 25% of the time - needs direction nearly all the time or does not effectively  solve problems and may need a restraint for safety Memory Memory: 1-Recognizes or recalls less than 25% of the time/requires cueing greater than 75% of the time FIM - Eating Eating Activity: 5: Supervision/cues  Pain: Reports pain in his neck, unable to rate: RN notified and medications given   Therapy/Group: Individual Therapy  Delayna Sparlin 11/02/2011, 12:05 PM

## 2011-11-02 NOTE — Progress Notes (Signed)
Nutrition Follow-up  Requires cues to slow down eating. Megace discontinued, pt eating well. Eating 50 - 100% of meals.  Diet Order:  Regular Supplements: Resource Breeze PO BID and 30 ml Prostat PO BID (provides an additional 644 kcal and 48 grams protein)  Meds: Scheduled Meds:   . chlorhexidine  15 mL Mouth/Throat QID  . citalopram  40 mg Oral QHS  . dantrolene  25 mg Oral QID  . diclofenac sodium   Topical QID  . feeding supplement  30 mL Oral BID  . feeding supplement  1 Container Oral BID BM  . fentaNYL  12.5 mcg Transdermal Q72H  . hydrocerin   Topical BID  . methylphenidate  5 mg Per Tube BID WC  . metoprolol tartrate  50 mg Per Tube BID  . pantoprazole  40 mg Oral Q1200  . saccharomyces boulardii  500 mg Oral BID   Continuous Infusions:  PRN Meds:.acetaminophen, alum & mag hydroxide-simeth, bisacodyl, guaiFENesin-dextromethorphan, hydrocortisone cream, oxyCODONE, polyethylene glycol, polyvinyl alcohol, prochlorperazine, prochlorperazine, prochlorperazine, traMADol  Labs:  CMP     Component Value Date/Time   NA 141 10/23/2011 1037   K 3.1* 10/23/2011 1037   CL 104 10/23/2011 1037   CO2 25 10/23/2011 1037   GLUCOSE 140* 10/23/2011 1037   BUN 13 10/23/2011 1037   CREATININE 0.84 10/23/2011 1037   CALCIUM 9.9 10/23/2011 1037   PROT 7.2 10/09/2011 0735   ALBUMIN 2.9* 10/09/2011 0735   AST 16 10/09/2011 0735   ALT 22 10/09/2011 0735   ALKPHOS 94 10/09/2011 0735   BILITOT 0.3 10/09/2011 0735   GFRNONAA >90 10/23/2011 1037   GFRAA >90 10/23/2011 1037   CBG (last 3)  No results found for this basename: GLUCAP:3 in the last 72 hours   Intake/Output Summary (Last 24 hours) at 11/02/11 1235 Last data filed at 11/01/11 2300  Gross per 24 hour  Intake   1080 ml  Output      0 ml  Net   1080 ml    Weight Status:  114.4 kg - wt stable up 1 kg x 1 week  Estimated needs:  2500 - 2800 kcal, 145 - 160 grams protein   Nutrition Dx:  Inadequate oral intake now r/t variable appetite  AEB variable PO intake and need for supplements to meet kcal and protein goals. Improved.   Goal:  Pt to consume >/= 75% of estimated needs. Improving.  Intervention:  Continue current interventions.  Monitor:  Weights, labs, I/O's, PO intake  Adair Laundry Pager #:  [phone]

## 2011-11-02 NOTE — Progress Notes (Signed)
Physical Therapy Note  Patient Details  Name: Mark [name] MRN: [id number] Date of Birth: Nov 06, 1965 Today's Date: 11/02/2011  Time: 1330-1425 55 minutes  Pt c/o R knee pain, RN made aware, ice applied after session.  Treatment focused on standing tolerance, progressing to stand pivot transfers.  Pt sit to stand with +2 assist 3 musketeers style.  Once standing pt able to sustain static stance with mod A up to 15 seconds.  Treatment progressed to stand pivot transfers with frequent cues for upright posture, pt able to initiate turn steps with B LEs, +2 assist for turning and backward stepping to complete transfers.  Pt with improved initiation and participation with standing transfer vs sliding board.  Good initiation and mm contraction noted in L LE.  Pt requires frequent cues do to decreased sustained attention and is easily internally distracted by pain in R knee.  Attempted to have pt eat lunch, pt unable to attend due to R knee pain.  Ice applied, PA and RN made aware.  Individual therapy    Mark [name] 11/02/2011, 3:44 PM

## 2011-11-02 NOTE — Progress Notes (Signed)
Physical Therapy Note  Patient Details  Name: Mark [name] MRN: [id number] Date of Birth: [birth year] Today's Date: 11/02/2011  Time: 1100-1153 53 minutes  Pt c/o R knee pain with flexion, eased when out of position.  Sliding board transfers +3 total assist today, pt 0%.  Pt with decreased motivation, not attempting to initiate or assist with movements.  Standing frame x 8 minutes with pt tossing horseshoes with improved attention to task.  Pt without weight on R LE, Resting weight on L UE and LE.  Standing 3 musketeers style 2 x 30 seconds with pt with improved trunk extension with manual cuing.  Maxi move back to bed due to pt's lack of assistance with any functional mobility.  Rolling total A in bed.  Pt reports he "doesn't care" about getting better.  Individual therapy   Lynnwood Beckford 11/02/2011, 12:19 PM

## 2011-11-02 NOTE — Progress Notes (Signed)
Physical Therapy Session Note  Patient Details  Name: Mark [name] MRN: [id number] Date of Birth: 1966/05/03  Today's Date: 11/02/2011 Time: 3086-5784 Time Calculation (min): 35 min  Short Term Goals: Week 4:  PT Short Term Goal 1 (Week 4): Pt will attend to functional task x 1 minute with min cuing PT Short Term Goal 2 (Week 4): Pt will assist with bed <> chair 60 % of time PT Short Term Goal 3 (Week 4): Pt will roll in bed with mod A  Skilled Therapeutic Interventions/Progress Updates:   "3 musketeer" style gait with total@+2, pt = 60%, 43' and 41', needing assist to advance LLE 75% of the time, tending to lean to the left throughout.  Tolerated well.  Pt seemed pleased as well as mom and brother.    Therapy Documentation Precautions:  Precautions Precautions: Fall Pain: Pain in R knee, with pain meds, pt grunting and cursing about the pain throughout session, but working through it.    See FIM for current functional status  Therapy/Group: Individual Therapy  Georges Mouse 11/02/2011, 4:25 PM

## 2011-11-02 NOTE — Progress Notes (Signed)
Patient toileted every 3 hrs using urinal . Bladder schedule unsuccessful thus far. Patient reports urge to urinate at times but when urinal placed patient does not urinate. Continue to toilet every 2-3hrs and prn . Instructed patient's mother to call staff if patient reports urge to urinate between toileting times . Continue with plan of care.     Cleotilde Neer
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
## 2011-11-02 NOTE — Progress Notes (Signed)
Occupational Therapy Weekly Progress Note  Patient Details  Name: Mark [name] MRN: [id number] Date of Birth: 09/20/1965  Today's Date: 11/02/2011 Time: 2130-8657 Time Calculation (min): 60 min  Patient has met 3 of 4 short term goals.   Pt has made some functional gains and has met 3 of 4 STG's this week. Currently, pt is overall Max A for sustained attention, verbal expression of wants/needs, functional problem solving and working memory. Pt will initiate UB bathing, UB dressing, grooming and eating with Min verbal and visual/ contexual cues. Pt requires Min-Mod verbal and tactile cues to monitor and correct impulsivity. Pt's verbal expression/ social interaction impacted by pt's mood/reports of "sadness" and "depression." Pt/family education ongoing. Pt usually has been receiving night bathes and today participated in a shower using a roll in chair. Pt still requires +2 for sit to stands, transfers, and for LB dressing. Pt continues to have pain and tone in right knee impacting his ability to perform LB ADL and mobility   Patient continues to demonstrate the following deficits: muscle weakness and muscle joint tightness, decreased cardiorespiratoy endurance, impaired timing and sequencing, abnormal tone, unbalanced muscle activation, decreased coordination and decreased motor planning, decreased initiation, decreased attention, decreased awareness, decreased problem solving, decreased safety awareness, decreased memory, delayed processing and difficulty with termination and decreased standing balance, decreased postural control and decreased balance strategies and therefore will continue to benefit from skilled OT intervention to enhance overall performance with BADL and Reduce care partner burden.  Patient progressing toward long term goals..  Continue plan of care.  OT Short Term Goals Week 3:  OT Short Term Goal 1 (Week 3): Pt will follow one step command with mod cuing for a familar tasks OT  Short Term Goal 1 - Progress (Week 3): Met OT Short Term Goal 2 (Week 3): Pt will A during a slide board transfer 25% including weight shift in functional transfer OT Short Term Goal 2 - Progress (Week 3): Not met OT Short Term Goal 3 (Week 3): Pt will initiate tasks with min cuing 50% time OT Short Term Goal 3 - Progress (Week 3): Met OT Short Term Goal 4 (Week 3): Pt will use call bell 25% appropriately  OT Short Term Goal 4 - Progress (Week 3): Partly met OT Short Term Goal 5 (Week 3): Pt will demonstrate sustained attention for 3 min with functional and familiar task. OT Short Term Goal 5 - Progress (Week 3): Met Week 4:  OT Short Term Goal 1 (Week 4): Pt will don shirt with min A with extra time OT Short Term Goal 2 (Week 4): Pt will wash UB with min A OT Short Term Goal 3 (Week 4): Pt will complete tooth brushing at sink with min cuing  OT Short Term Goal 4 (Week 4): Pt will transfer to [facility] with total A +2 pt 25%  Skilled Therapeutic Interventions/Progress Updates:    1:1 today's session: self care retraining at shower level. Pt wet with urine in bed when arrived. Supine to EOB with max A (but with increased trunk/core strength), sit to stand with foot plate with SARA to transfer to roll in shower chair. Pt resisant to attempt to stand with and presented with a strong posterior pelvic tilt with difficulty with right knee flexion. Pt with increased participation in shower bathing 6/10 parts.   Therapy Documentation Precautions:  Precautions Precautions: Fall Required Braces or Orthoses: Cervical Brace Cervical Brace: Hard collar;Applied in supine position Restrictions Weight Bearing Restrictions: No General:  Vital Signs:   Pain: Pain Assessment Pain Assessment: No/denies pain PAINAD (Pain Assessment in Advanced Dementia) Breathing: normal Negative Vocalization: none Facial Expression: smiling or inexpressive Body Language: relaxed Consolability: no need to console PAINAD  Score: 0  ADL: ADL Eating: Supervision/safety;Moderate cueing Grooming: Minimal assistance;Moderate cueing Upper Body Bathing: Dependent Lower Body Bathing: Dependent Upper Body Dressing: Maximal assistance Lower Body Dressing: Dependent Toileting: Dependent Exercises:   Other Treatments:    See FIM for current functional status  Therapy/Group: Individual Therapy  [name] [facility] 11/02/2011, 3:20 PM
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
## 2011-11-03 DIAGNOSIS — W320XXA Accidental handgun discharge, initial encounter: Secondary | ICD-10-CM

## 2011-11-03 DIAGNOSIS — F329 Major depressive disorder, single episode, unspecified: Secondary | ICD-10-CM

## 2011-11-03 DIAGNOSIS — X749XXA Intentional self-harm by unspecified firearm discharge, initial encounter: Secondary | ICD-10-CM

## 2011-11-03 DIAGNOSIS — Z5189 Encounter for other specified aftercare: Secondary | ICD-10-CM

## 2011-11-03 DIAGNOSIS — S0190XA Unspecified open wound of unspecified part of head, initial encounter: Secondary | ICD-10-CM

## 2011-11-03 DIAGNOSIS — S069X9A Unspecified intracranial injury with loss of consciousness of unspecified duration, initial encounter: Secondary | ICD-10-CM

## 2011-11-03 MED ORDER — TRAMADOL HCL 50 MG PO TABS
50.0000 mg | ORAL_TABLET | Freq: Three times a day (TID) | ORAL | Status: DC
  Administered 2011-11-03 – 2011-11-19 (×62): 50 mg via ORAL
  Filled 2011-11-03 (×61): qty 1

## 2011-11-03 MED ORDER — METHYLPHENIDATE HCL 5 MG PO TABS
5.0000 mg | ORAL_TABLET | Freq: Two times a day (BID) | ORAL | Status: DC
  Administered 2011-11-03 – 2011-11-19 (×32): 5 mg via ORAL
  Filled 2011-11-03 (×32): qty 1

## 2011-11-03 MED ORDER — NAPHAZOLINE-GLYCERIN 0.012-0.2 % OP SOLN
2.0000 [drp] | Freq: Four times a day (QID) | OPHTHALMIC | Status: DC
  Filled 2011-11-03: qty 15

## 2011-11-03 MED ORDER — METOPROLOL TARTRATE 25 MG PO TABS
25.0000 mg | ORAL_TABLET | Freq: Two times a day (BID) | ORAL | Status: DC
  Administered 2011-11-03 – 2011-11-19 (×32): 25 mg via ORAL
  Filled 2011-11-03 (×36): qty 1

## 2011-11-03 MED ORDER — NAPHAZOLINE-PHENIRAMINE 0.025-0.3 % OP SOLN
2.0000 [drp] | Freq: Four times a day (QID) | OPHTHALMIC | Status: DC
  Administered 2011-11-03 – 2011-11-05 (×7): 2 [drp] via OPHTHALMIC
  Filled 2011-11-03 (×2): qty 15

## 2011-11-03 NOTE — Progress Notes (Signed)
Occupational Therapy Session Note  Patient Details  Name: Mark [name] MRN: [id number] Date of Birth: [birth year]  Today's Date: 11/03/2011 Time: 4098-1191 Time Calculation (min): 33 min   Skilled Therapeutic Interventions/Progress Updates: Patient seen for 1:1 OT session this am.  Mom attended therapy session with patient.  Patient required total assist squat pivot transfer from chair to mat table (to right.)  Patient chuckling at end of transfer.  Patient performed little to no active movement during transfer.  Patient fatigued per PT who had recently completed treatment.  Discussed with patient the need for static progressive splint for right knee.  Showed patient pictures and described splint schedule, and stretch tolerance.  Patient and mom agreeable to static progressive splint.  Transfer back to wheelchair, via multiple tiny scoots (moving toward left level surface.)  Mom present to stabilize chair, and to encourage participation.  He stopped transition frequently when distracted by himself (skin on hands) or by his environment (wheelchair cushion handle)  Offered best response to very brief and very direct cues to complete the transfer.  Patient completed 90% of this transfer with only incidental minimal assistance.  Last 10% required mod assist boost.  Time for transfer 10 min.       Therapy Documentation Precautions:  Precautions Precautions: Fall Required Braces or Orthoses: Cervical Brace Cervical Brace: Hard collar;Applied in supine position Restrictions Weight Bearing Restrictions: No   Pain:  Patient rubbing right knee throughout treatment session.  Stated discomfort in left wrist when taking weight through it during transfer (on one occasion.)   Exercises:  Asked mom to encourage left hand grasp / release, digit flexion and extension to limit further edema.   See FIM for current functional status  Therapy/Group: Individual Therapy  Collier Salina 11/03/2011, 12:04 PM
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
## 2011-11-03 NOTE — Progress Notes (Signed)
Speech Language Pathology Daily Session Note  Patient Details  Name: Mark [name] MRN: [id number] Date of Birth: 21-Oct-1965  Today's Date: 11/03/2011 Time: 4098-1191 Time Calculation (min): 60 min  Short Term Goals:  SLP Short Term Goal 1 (Week 4): Pt will intiate self-feeding with supervision verbal cues SLP Short Term Goal 2 (Week 4): Pt demonstrated sustained attention to a functional task for ~ 2 minutes with Mod A SLP Short Term Goal 3 (Week 4): pt will verball express wants/needs with Mod A semantic cues SLP Short Term Goal 4 (Week 4): Pt will identify 1 physical and 1 cognitive function with Max semantic cues.   Skilled Therapeutic Interventions: Treatment focus on initiation, sustained attention, verbal expression. Pt with decreased sustained attention with focus on pain in right knee. RN made aware. Pt required Max A verbal and visual cues for initiation of meal and for sustained attention to self-feeding task. Pt independently recalled that the he will participate in a cooking task tomorrow and the item he will be making. Required Total-Max A to for task to encourage verbal expression by describing a word/picture to the clinician. Pt only expressed pain throughout the session.    Daily Session FIM:  Comprehension Comprehension Mode: Auditory Comprehension: 3-Understands basic 50 - 74% of the time/requires cueing 25 - 50%  of the time Expression Expression Mode: Verbal Expression: 2-Expresses basic 25 - 49% of the time/requires cueing 50 - 75% of the time. Uses single words/gestures. Social Interaction Social Interaction: 2-Interacts appropriately 25 - 49% of time - Needs frequent redirection. Problem Solving Problem Solving: 1-Solves basic less than 25% of the time - needs direction nearly all the time or does not effectively solve problems and may need a restraint for safety Memory Memory: 2-Recognizes or recalls 25 - 49% of the time/requires cueing 51 - 75% of the time FIM -  Eating Eating Activity: 5: Supervision/cues  Pain: Pt reports pain in his knee, Pt repositioned, RN aware and more medications given.   Therapy/Group: Individual Therapy  [name] 11/03/2011, 11:47 AM
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
## 2011-11-03 NOTE — Progress Notes (Signed)
Physical Therapy Note  Patient Details  Name: Masahiro Iglesia MRN: [id number] Date of Birth: 1965-11-12 Today's Date: 11/03/2011  Time: 1015-1110 55 minutes  Pt with c/o "bad" R knee pain. RN aware and pt premedicated.  Attempted gait training 3 musketeers style with +2 assist, total A for L LE advancement.  Pt able to gait 2 x 10 feet but decreased attention and activity tolerance due to pain.  Seated ball toss activity with pt naming foods with letters he saw on the ball.  Pt required assist to use L UE for catching ball, able to perform alternating attention task with mod-max A.  UBE for UE strength and activity tolerance 3 mins, 5 mins.  Pt able to grip UBE with L hand without assist.  Pt overall with good participation but attention limited by R knee pain.  Individual therapy   Ranetta Armacost 11/03/2011, 3:06 PM

## 2011-11-03 NOTE — Progress Notes (Signed)
Physical Therapy Note  Patient Details  Name: Mark [name] MRN: [id number] Date of Birth: [birth year] Today's Date: 11/03/2011  Time: 1330-1430 60 minutes  Pt c/o pain in R LE.  RN aware.  Stand pivot transfer with +2 assist, pt needs manual facilitation for wt shifts but is able to move feet independently to assist with transfer.  Pt requires max A to advance R LE forward or backward for stepping.  Stair climbing to increase pt initiation and participation with functional gait tasks.  Up/down 1 stair x 2 attempts with total +2 assist.  Pt able to assist with stepping up with R LE, requires total A for L LE.  Increased muscle activity palpated in L LE during step ups and stance today.  Attempt to engage pt in ball toss and basketball activities.  Pt with flat affect, decreased participation even with max encouragement.  Standing 2 x 45 seconds for activity tolerance while naming [employer] and [employer] teams.  Pt with good participation in naming, some perseveration noted.  Pt requested to use sliding board for transfer at end of session due to fatigue.  +2 assist for transfer with pt 25%.  Individual therapy   Dontee Jaso 11/03/2011, 2:32 PM

## 2011-11-03 NOTE — Progress Notes (Signed)
Recreational Therapy Session Note  Patient Details  Name: Saabir Blyth MRN: [id number] Date of Birth: 16-Aug-1965 Today's Date: 11/03/2011 Time:  9147-8295 Pain: c/o "bad" R knee pain, RN aware and premedicated Skilled Therapeutic Interventions/Progress Updates:  Attempted basketball activity seated shooting baskets.  Pt with a more flat affect today, difficult to engage in task, requiring Max cues for encouragement.  Standing 45 seconds x2 with Total assist +2 while naming sports teams.  Slide board transfer total assist +2 Therapy/Group: Co-Treatment  Sumaya Riedesel 11/03/2011, 4:18 PM

## 2011-11-03 NOTE — Progress Notes (Signed)
Occupational Therapy Session Note  Patient Details  Name: Mark [name] MRN: [id number] Date of Birth: 1965/09/22  Today's Date: 11/03/2011 Time: 4098-1191 Time Calculation (min): 60 min  Short Term Goals: Week 4:  OT Short Term Goal 1 (Week 4): Pt will don shirt with min A with extra time OT Short Term Goal 2 (Week 4): Pt will wash UB with min A OT Short Term Goal 3 (Week 4): Pt will complete tooth brushing at sink with min cuing  OT Short Term Goal 4 (Week 4): Pt will transfer to [facility] with total A +2 pt 25%  Skilled Therapeutic Interventions/Progress Updates:    1:1 self care retraining: focus on dressing only. Pt with picking behavior of dry skin on hands/arms causing increased distractions inhibiting his ability to attend to task at hand. Dressing with max total A, however increased ability to perform sit to stand from elevated bed using 3 musketeers method x2, then pt wanted to continue with functional ambulation from his bed to the RN station with A to advance left LE at first but then able to continue with increased ability to advance left LE as he continued. Ate breakfast with decreased impulsivity but difficulty attending to complete breakfst 2ndary to pain in knee.   Therapy Documentation Precautions:  Precautions Precautions: Fall Required Braces or Orthoses: Cervical Brace Cervical Brace: Hard collar;Applied in supine position Restrictions Weight Bearing Restrictions: No Pain:  at end of session c/o intense pain in right knee, RN gave meds and added gel to knee  See FIM for current functional status  Therapy/Group: Individual Therapy  [name] [facility] 11/03/2011, 12:13 PM

## 2011-11-04 DIAGNOSIS — M1710 Unilateral primary osteoarthritis, unspecified knee: Secondary | ICD-10-CM

## 2011-11-04 DIAGNOSIS — Z5189 Encounter for other specified aftercare: Secondary | ICD-10-CM

## 2011-11-04 DIAGNOSIS — S069X9A Unspecified intracranial injury with loss of consciousness of unspecified duration, initial encounter: Secondary | ICD-10-CM

## 2011-11-04 DIAGNOSIS — R259 Unspecified abnormal involuntary movements: Secondary | ICD-10-CM

## 2011-11-04 DIAGNOSIS — W320XXA Accidental handgun discharge, initial encounter: Secondary | ICD-10-CM

## 2011-11-04 LAB — GLUCOSE, CAPILLARY: Glucose-Capillary: 109 mg/dL — ABNORMAL HIGH (ref 70–99)

## 2011-11-04 MED ORDER — DANTROLENE SODIUM 25 MG PO CAPS
50.0000 mg | ORAL_CAPSULE | Freq: Four times a day (QID) | ORAL | Status: DC
  Administered 2011-11-04 – 2011-11-19 (×57): 50 mg via ORAL
  Filled 2011-11-04 (×66): qty 2

## 2011-11-04 MED ORDER — TRIAMCINOLONE ACETONIDE 40 MG/ML IJ SUSP
40.0000 mg | Freq: Once | INTRAMUSCULAR | Status: DC
  Filled 2011-11-04: qty 1

## 2011-11-04 MED ORDER — LIDOCAINE-EPINEPHRINE 1 %-1:100000 IJ SOLN
10.0000 mL | Freq: Once | INTRAMUSCULAR | Status: DC
  Filled 2011-11-04: qty 10
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
## 2011-11-04 NOTE — Progress Notes (Signed)
Recreational Therapy Session Note  Patient Details  Name: Mark [name] MRN: [id number] Date of Birth: August 30, 1965 Today's Date: 11/04/2011 Time:905-10  Pain: no c/o  Skilled Therapeutic Interventions/Progress Updates: Cooking activity seated-making meatloaf with max assist/cues for problem solving and sequencing, mod assist/cues for initiation, attention to task, BUE use.  Pt more interactive today  Therapy/Group: Co-Treatment  Level of assist: Mod-Max A/cues   Sid Greener 11/04/2011, 12:10 PM

## 2011-11-04 NOTE — Progress Notes (Signed)
Physical Therapy Note  Patient Details  Name: Mark [name] MRN: [id number] Date of Birth: 10/20/1965 Today's Date: 11/04/2011  Time: 1330-1425 55 minutes  Pt c/o R knee pain, premedicated, rests as needed.  SPT training with +2 assist multiple attempts with focus on pt initiation pivot of B LEs, upright posture throughout turn.  Pt requires +2 as he becomes fatigues and attempts to sit before properly positioned during transfer.  Seated and standing balance for dressing and hygiene tasks, sitting with supervision, standing with mod A once in upright position with B underarm support.  W/c mobility with B UEs with mod-max A, hand over hand for L UE.  Pt with improved attention to w/c task (up to 30 seconds).  Alternating attention with ball toss task in quiet environment with min questioning cues.  Pt able to attend in quiet environment x 1-2 minutes before being internally distracted by pain or itching.  Decreased standing tolerance but improved attention and participation this afternoon.  Individual therapy  Katrenia Alkins 11/04/2011, 5:42 PM

## 2011-11-04 NOTE — Progress Notes (Signed)
Speech Language Pathology Daily Session Note  Patient Details  Name: Mark [name] MRN: [id number] Date of Birth: 1965/07/30  Today's Date: 11/04/2011 Time: 0900-1000 Time Calculation (min): 60 min  Short Term Goals:  SLP Short Term Goal 1 (Week 4): Pt will intiate self-feeding with supervision verbal cues SLP Short Term Goal 2 (Week 4): Pt demonstrated sustained attention to a functional task for ~ 2 minutes with Mod A SLP Short Term Goal 3 (Week 4): pt will verball express wants/needs with Mod A semantic cues SLP Short Term Goal 4 (Week 4): Pt will identify 1 physical and 1 cognitive function with Max semantic cues.   Skilled Therapeutic Interventions: Treatment focus on functional problem solving, initiation, and sequencing. SLP facilitated by utilizing a kitchen task of making meatloaf. Pt required Max A verbal, semantic and questioning cues for functional problem solving and sequencing of steps with task. Pt required Mod A to for initiation of task and for utilization of BUEs for tasks. Pt with increased social interaction and engagement today.    Daily Session FIM:  Comprehension Comprehension: 3-Understands basic 50 - 74% of the time/requires cueing 25 - 50%  of the time Expression Expression: 2-Expresses basic 25 - 49% of the time/requires cueing 50 - 75% of the time. Uses single words/gestures. Social Interaction Social Interaction: 2-Interacts appropriately 25 - 49% of time - Needs frequent redirection. Problem Solving Problem Solving: 1-Solves basic less than 25% of the time - needs direction nearly all the time or does not effectively solve problems and may need a restraint for safety Memory Memory: 2-Recognizes or recalls 25 - 49% of the time/requires cueing 51 - 75% of the time FIM - Eating Eating Activity: 5: Supervision/cues  Pain: Pain in knee ongoing, RN aware, pt premedicated   Therapy/Group: Individual Therapy  Jenny Omdahl 11/04/2011, 3:48 PM

## 2011-11-04 NOTE — Progress Notes (Signed)
Occupational Therapy Session Note  Patient Details  Name: Mark [name] MRN: [id number] Date of Birth: 1965-07-06  Today's Date: 11/04/2011 Time: 0815-0900 Time Calculation (min): 45 min  Short Term Goals: Week 4:  OT Short Term Goal 1 (Week 4): Pt will don shirt with min A with extra time OT Short Term Goal 2 (Week 4): Pt will wash UB with min A OT Short Term Goal 3 (Week 4): Pt will complete tooth brushing at sink with min cuing  OT Short Term Goal 4 (Week 4): Pt will transfer to [facility] with total A +2 pt 25%  Skilled Therapeutic Interventions/Progress Updates:    1:1 self care retraining. Pt recalled today is "Meatloaf Wednesday" and he was going to make meatloaf in therapy today. This recall prompted him to come to EOB with min A ( A with advancement of right hip forward). Pt don shirt with mod cuing and extra time to attend and complete tasks with min A, but performed 4/4 parts. Still requiring total A for LB dressing; total A to move left leg (even though pt can advance the leg in functional ambulation and stand pivot transfers) Pt was incontient of urine in brief, pt performed pericare in the front sitting and total A for hygiene in the back and clothes management total A +2 sit to stand, stand pivot: Pt performing 50% with +2. Engaged in eating breakfast with increased functional use of left UE as an active assist as well as dominant for trying to drink out of cup.  Therapy Documentation Precautions:  Precautions Precautions: Fall Required Braces or Orthoses: Cervical Brace Cervical Brace: Hard collar;Applied in supine position Restrictions Weight Bearing Restrictions: No Pain:  ongoing pain in right knee unrated- RN aware  See FIM for current functional status  Therapy/Group: Individual Therapy  [name] [facility] 11/04/2011, 12:13 PM

## 2011-11-04 NOTE — Progress Notes (Signed)
Physical Therapy Note  Patient Details  Name: Mark [name] MRN: [id number] Date of Birth: 26-Dec-1965 Today's Date: 11/04/2011  Time: 1015-1110 55 minutes  Pt c/o R knee pain with standing, R eye pain.  RN aware.  Pt ambulated with +2 assist, 3 musketeers style 2 x 40' with assist to advance L LE, assist for wt shifting to R, pt guarding to R knee pain with stance.  Attempt standing balance with RW for support to simulate standing for ADLs, pt continues to require max A for standing with RW due to L sided weakness, requires manual assist to correct LOB to L.  SPT training with +2 assist, 3 musketeer style with pt able to pivot feet without assist with manual cues for wt shifts and verbal, manual cues for sequencing.  Throughout session pt to kitchen to check on food that was cooking, pt perseverating on idea of it needing to cook "20 more minutes".  Able to increase/decrease time appropriately with mod-max questioning cues.  Pt with improved tolerance to standing this session, continues to be limited by R knee pain.  Individual therapy   Shawndra Clute 11/04/2011, 5:35 PM

## 2011-11-04 NOTE — Plan of Care (Signed)
Problem: RH KNOWLEDGE DEFICIT Goal: RH STG INCREASE KNOWLEDGE OF HYPERTENSION Patient and mother will be able to verbalize understanding of signs and symptoms of hypertension.  Outcome: Progressing Educate mother Goal: RH STG INCREASE KNOWLEDGE OF DYSPHAGIA/FLUID INTAKE Patient and mother will be able to verbalize understanding of dysphasia .  Outcome: Progressing Educate mother

## 2011-11-05 ENCOUNTER — Telehealth: Payer: Self-pay | Admitting: Physical Medicine & Rehabilitation

## 2011-11-05 MED ORDER — TIZANIDINE HCL 2 MG PO TABS
2.0000 mg | ORAL_TABLET | Freq: Every day | ORAL | Status: DC
  Administered 2011-11-05 – 2011-11-09 (×5): 2 mg via ORAL
  Filled 2011-11-05 (×6): qty 1

## 2011-11-05 MED ORDER — WHITE PETROLATUM GEL
Status: AC
  Administered 2011-11-05: 18:00:00
  Filled 2011-11-05: qty 5

## 2011-11-05 MED ORDER — POLYVINYL ALCOHOL 1.4 % OP SOLN
1.0000 [drp] | OPHTHALMIC | Status: AC | PRN
  Administered 2011-11-06 – 2011-11-08 (×3): 1 [drp] via OPHTHALMIC
  Filled 2011-11-05: qty 15

## 2011-11-05 NOTE — Telephone Encounter (Addendum)
Patients mother, Loma Sender, is seeking legal help in getting benefits and things in place for the patient.  Need to know condition, ability to improve, etc.  May be filing for guardianship.  WHY IS SHE CALLING HERE?  I SEE HER EVERY DAY IN THE HOSPITAL. I AM ALREADY OF AWARE OF THIS!!! :) ZTS

## 2011-11-05 NOTE — Progress Notes (Signed)
Recreational Therapy Session Note  Patient Details  Name: Vennie Waymire MRN: [id number] Date of Birth: 28-Mar-1966 Today's Date: 11/05/2011 Time: 925-10 Pain: no c/o pain  Skilled Therapeutic Interventions/Progress Updates: Pt played tabletop game, "Connect" identifying which tile of out of 4 didn't belong in the group.  Pt with increased verbal expression, social interaction and sustained attention to category to task with 75% accuracy   Therapy/Group: Co-Treatment  Activity Level: Simple:  Level of assist: Mod Assist  Claudette Wermuth 11/05/2011, 10:39 AM

## 2011-11-05 NOTE — Progress Notes (Signed)
Physical Therapy Note  Patient Details  Name: Mark [name] MRN: [id number] Date of Birth: 31-Dec-1965 Today's Date: 11/05/2011  10:15-11:00 individual therapy pt complained of spasms in rt. Leg unrated. Pt repositioned.  Sit to stand at table x 2 with 2 person assist. First trial pt= 60% second trial pt = 40 %. Pt stood to place horseshoes on basket ball goal for hip extesion and balance. Performed AAROM with beach ball x 10 for left LE shoulder flexion.   Julian Reil 11/05/2011, 12:26 PM

## 2011-11-05 NOTE — Progress Notes (Signed)
Physical Therapy Session Note  Patient Details  Name: Mark [name] MRN: [id number] Date of Birth: 09/13/1965  Today's Date: 11/05/2011 Time: 5621-3086 Time Calculation (min): 57 min  Short Term Goals: Week 4:  PT Short Term Goal 1 (Week 4): Pt will attend to functional task x 1 minute with min cuing PT Short Term Goal 2 (Week 4): Pt will assist with bed <> chair 60 % of time PT Short Term Goal 3 (Week 4): Pt will roll in bed with mod A  Skilled Therapeutic Interventions/Progress Updates:    Therapeutic activity- transfer training - pt transferred from tilt in space to bed towards R with min A! Scooting, from bed transferred to regular w/c towards L but took +2 A pt=25% - pt was trying and initiating all movment; sitting EOB while PT stretched RLE (rep from brace company also measured RLE for splint) S without LOB  W/c training in regular chair- pt used RUE but not LUE, attempted to get pt to use RLE to increase acitve ROM in knee with total cues and encouragment- pt did use LUE w/ asiisst for 10 rep elbow flex/ext and supination/pronation   Mom observed tx Pt with decreased sustained attention to mobility in room (min A) and max A for selective attention to mobility in hall with greater distractions Therapy Documentation Precautions:  Precautions Precautions: Fall Required Braces or Orthoses: Cervical Brace Cervical Brace: Hard collar;Applied in supine position Restrictions Weight Bearing Restrictions: No Pain: with knee ROM R    See FIM for current functional status  Therapy/Group: Individual Therapy  Michaelene Song 11/05/2011, 3:35 PM

## 2011-11-05 NOTE — Care Management Note (Signed)
Patient ID: Mark [name], male   DOB: [birth year], 46 y.o.   MRN: [id number] Updates from admit to CIR till 11/04/11 compiled & faxed to [employer] at [place] [facility] [place].

## 2011-11-05 NOTE — Progress Notes (Signed)
NEUROPSYCHOLOGY  Name:  Mark [name] DOB:  1965/12/08 MRN:  [id number] Date: 11/04/11  He spontaneously spoke of his suicide attempt and depression secondary to losing his job. He denied currently feeling depressed or having thoughts of suicide. No concerns or questions elicited in regards to his rehabilitation program.  His affect appeared brighter. Verbal expression was more spontaneous and sustained. He showed some perseverative picking at his right hand fingers. He recalled an approximation of my name after hearing it for the first time yesterday.   Gladstone Pih, Ph.D

## 2011-11-05 NOTE — Progress Notes (Signed)
Speech Language Pathology Daily Session Note  Patient Details  Name: Mark [name] MRN: [id number] Date of Birth: 1965/12/07  Today's Date: 11/05/2011 Time: 0900-1000 Time Calculation (min): 60 min  Short Term Goals:  SLP Short Term Goal 1 (Week 4): Pt will intiate self-feeding with supervision verbal cues SLP Short Term Goal 2 (Week 4): Pt demonstrated sustained attention to a functional task for ~ 2 minutes with Mod A SLP Short Term Goal 3 (Week 4): pt will verball express wants/needs with Mod A semantic cues SLP Short Term Goal 4 (Week 4): Pt will identify 1 physical and 1 cognitive function with Max semantic cues.   Skilled Therapeutic Interventions: Treatment focus on initiation, problem solving and verbal expression. Pt required supervision verbal cues to initiate meal and Mod A verbal and visual cues for self-feeding (ex. utilizing utensils instead of hands to eat). Pt with increased verbal expression, social interaction and sustained attention to category exclusion task with 75% accuracy with Mod verbal and semantic cues.    Daily Session FIM:  Comprehension Comprehension: 3-Understands basic 50 - 74% of the time/requires cueing 25 - 50%  of the time Expression Expression Mode: Verbal Expression: 2-Expresses basic 25 - 49% of the time/requires cueing 50 - 75% of the time. Uses single words/gestures. Social Interaction Social Interaction: 2-Interacts appropriately 25 - 49% of time - Needs frequent redirection. Problem Solving Problem Solving: 1-Solves basic less than 25% of the time - needs direction nearly all the time or does not effectively solve problems and may need a restraint for safety Memory Memory: 2-Recognizes or recalls 25 - 49% of the time/requires cueing 51 - 75% of the time FIM - Eating Eating Activity: 5: Supervision/cues Pain Pain Assessment Pain Assessment: 0-10 Pain Score:   5 PAINAD (Pain Assessment in Advanced Dementia) Breathing: normal Negative  Vocalization: none Facial Expression: smiling or inexpressive Body Language: relaxed Consolability: no need to console PAINAD Score: 0   Therapy/Group: Individual Therapy  Ruthanna Macchia 11/05/2011, 11:56 AM
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
## 2011-11-05 NOTE — Progress Notes (Signed)
Occupational Therapy Session Note  Patient Details  Name: Tomi Paddock MRN: [id number] Date of Birth: 06-19-1966  Today's Date: 11/05/2011 Time: 0815-0900 Time Calculation (min): 45 min  Short Term Goals: Week 4:  OT Short Term Goal 1 (Week 4): Pt will don shirt with min A with extra time OT Short Term Goal 2 (Week 4): Pt will wash UB with min A OT Short Term Goal 3 (Week 4): Pt will complete tooth brushing at sink with min cuing  OT Short Term Goal 4 (Week 4): Pt will transfer to [facility] with total A +2 pt 25%  Skilled Therapeutic Interventions/Progress Updates:    1:1 self care retraining at shower level , scoot pivot with +2 max A from bed to drop arm shower chair. Pt bathed 7/10 parts with min A with max cuing and tactile cues for use of left UE, donned shirt with mod A with extra time due to decreased attention and distracted by air temperature. Sit to stand at sink with +2 for LB clothing management and for stand step transfer to w/c. Once standing able to maintain balance with mod A - tends to lean to the left, able to make postural adjustments in standing to maintain balance.   Therapy Documentation Precautions:  Precautions Precautions: Fall Required Braces or Orthoses: Cervical Brace Cervical Brace: Hard collar;Applied in supine position Restrictions Weight Bearing Restrictions: No Pain:  ongoing pain in left shoulder/neck - RN made aware  See FIM for current functional status  Therapy/Group: Individual Therapy  [name] [facility] 11/05/2011, 11:19 AM

## 2011-11-06 ENCOUNTER — Inpatient Hospital Stay (HOSPITAL_COMMUNITY): Payer: BC Managed Care – PPO

## 2011-11-06 DIAGNOSIS — S069X9A Unspecified intracranial injury with loss of consciousness of unspecified duration, initial encounter: Secondary | ICD-10-CM

## 2011-11-06 DIAGNOSIS — W320XXA Accidental handgun discharge, initial encounter: Secondary | ICD-10-CM

## 2011-11-06 DIAGNOSIS — M1710 Unilateral primary osteoarthritis, unspecified knee: Secondary | ICD-10-CM

## 2011-11-06 DIAGNOSIS — R259 Unspecified abnormal involuntary movements: Secondary | ICD-10-CM

## 2011-11-06 DIAGNOSIS — Z5189 Encounter for other specified aftercare: Secondary | ICD-10-CM

## 2011-11-06 NOTE — Progress Notes (Signed)
Physical Therapy Session Note  Patient Details  Name: Mark [name] MRN: [id number] Date of Birth: 1965-11-03  Today's Date: 11/06/2011 Time: 11:00-11:30 Time Calculation (min): 30 min  Short Term Goals: Week 4:  PT Short Term Goal 1 (Week 4): Pt will attend to functional task x 1 minute with min cuing PT Short Term Goal 2 (Week 4): Pt will assist with bed <> chair 60 % of time PT Short Term Goal 3 (Week 4): Pt will roll in bed with mod A  Skilled Therapeutic Interventions/Progress Updates:    Co-Treatment focused on pt initiating and contributing during sit to stand transition. Practiced anterior trunk leans with assorted UE placements. Performed UE "push - ups" on blocks in addition to forwards trunk lean for initial bottom clearance. Verbal cues for sequence and quad contraction of Lt. LE. Carried over techniques to full stand from elevated surface, pt performed best when holding onto locked wheelchair in front. Pt progressed from +2 total assist pt = 40% to 2 persons for safety but only needing moderate assist to stand. Two scoot transfers performed with min assist to the Rt. Verbal cues throughout for increased use and attention to Lt. UE, increased flexion of Rt. LE, and contribution of bil. LEs to transfers.   Therapy Documentation Precautions:  Precautions Precautions: Fall Required Braces or Orthoses: Cervical Brace Cervical Brace: Hard collar;Applied in supine position Restrictions Weight Bearing Restrictions: No Pain: Pain Assessment Pain Assessment: Faces Faces Pain Scale: Hurts little more Pain Type: Acute pain Pain Location: Knee Pain Orientation: Right Pain Descriptors: Aching Pain Onset: Gradual Pain Intervention(s): Repositioned Multiple Pain Sites: Yes 2nd Pain Site Wong-Baker Pain Rating: Hurts little more Pain Type: Acute pain Pain Location: Shoulder Pain Orientation: Left Pain Onset: With Activity (above ~80degrees flexion) Pain Intervention(s):  Repositioned (kept ROM below range of pain. )  See FIM for current functional status  Therapy/Group: Co-Treatment  Wilhemina Bonito 11/06/2011, 12:16 PM

## 2011-11-06 NOTE — Progress Notes (Addendum)
Pt c/o increased rt knee pain today; grimaces, yelling out at times with transfers;increased edema this afternoon, ice pack, scheduled and prn meds as ordered; Pam, PAC aware. x-ray 2 view , results pending.  Carlean Purl

## 2011-11-06 NOTE — Progress Notes (Signed)
Physical Therapy Session Note  Patient Details  Name: Mark [name] MRN: [id number] Date of Birth: 08-Mar-1966  Today's Date: 11/06/2011 Time: 1015-1059 Time Calculation (min): 44 min  Second Treatment:  14:05-14:57 Time:  42 min.  Short Term Goals: Week 4:  PT Short Term Goal 1 (Week 4): Pt will attend to functional task x 1 minute with min cuing PT Short Term Goal 2 (Week 4): Pt will assist with bed <> chair 60 % of time PT Short Term Goal 3 (Week 4): Pt will roll in bed with mod A  Skilled Therapeutic Interventions/Progress Updates:      Therapy Documentation Precautions:  Precautions Precautions: Fall  Pain: Pt grunts and moans a lot with standing and gait due to pain in R knee, not rated and pain meds given to assist with decreasing the pain. Mobility:  am and pm sessions:  Sit to stand from w/c with total@+2, pt = 40-50%, difficult for pt to perform due to pain in R knee does not allow pt to bend enough to get under him to push up.  Pt is also tall and the w/c is low.  Pm session:  Sliding board transfer bed to w/c with mod @. Locomotion :   am session:  Gait training outside on level terrain 60' x 3, "3 musketeer style" with total @+2, pt = 60%, once pt is up and gets moving his gait pattern starts to look more normal and he does not need assist to advance the LLE.  Pm session:  Gait in same fashion 100'. Balance: Dynamic standing balance using back of chair to hold onto, while tossing and catching a ball with mod-max @+1.  Pt would tolerate standing 1-2 min before sitting down x3.  Difficult for pt to get LEs into full extension in standing, tended to be flexed at the knees.  See FIM for current functional status  Therapy/Group: Individual Therapy  Georges Mouse 11/06/2011, 3:14 PM
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
## 2011-11-06 NOTE — Plan of Care (Signed)
Problem: RH COGNITION-NURSING Goal: RH STG ANTICIPATES NEEDS/CALLS FOR ASSIST W/ASSIST/CUES STG Anticipates Needs/Calls for Assist With mod Assistance/Cues.  Outcome: Progressing Increased verbalization of needs when staff present at [facility]

## 2011-11-06 NOTE — Progress Notes (Signed)
Occupational Therapy Note  Patient Details  Name: Mark [name] MRN: [id number] Date of Birth: [birth year] Today's Date: 11/06/2011  Time: 1130-1200 Pt indicated pain with movement of RLE and LUE Co-tx with Physical therapy  Focus on scoot transfer w/c<>therapy mat.  Pt completes task with steady assist.  Additional focus on trunk mobility with emphasis on forward leaning to facilitate sit to stand transitional movements.  Pt initially exhibited difficulty with forward leans but near completion of tasks demonstrated increased ability to complete task.  Pt transitioned to sit to stand with +2 assistance.  Pt exhibited success with placement of chair in front to facilitate forwards lean to unweight buttocks from mat in preparation for sit to stand.  Pt exhibited increased success with repetitions of task.  Pt easily distracted by external factors and requires mod verbal cues for redirection.   Lavone Neri [facility] 11/06/2011, 3:19 PM

## 2011-11-06 NOTE — Progress Notes (Signed)
Speech Language Pathology Daily Session Note  Patient Details  Name: Mark [name] MRN: [id number] Date of Birth: 06-Aug-1965  Today's Date: 11/06/2011 Time: 7829-5621 Time Calculation (min): 42 min  Short Term Goals:  SLP Short Term Goal 1 (Week 4): Pt will intiate self-feeding with supervision verbal cues SLP Short Term Goal 2 (Week 4): Pt demonstrated sustained attention to a functional task for ~ 2 minutes with Mod A SLP Short Term Goal 3 (Week 4): pt will verball express wants/needs with Mod A semantic cues SLP Short Term Goal 4 (Week 4): Pt will identify 1 physical and 1 cognitive function with Max semantic cues.   Skilled Therapeutic Interventions: Treatment focus on intiaition, sustained attention, working memory, and problem solving. Pt sustained attention to task for ~30 secs with Max A verbal and tactile cues for redirection. Pt reporting increased fatigue today with decreased verbal expression and engagement. Pt sequenced steps for task with Min A semantic and questioning cues and required Mod A verbal cues to name items in a specific category.   Daily Session FIM:  Comprehension Comprehension Mode: Auditory Comprehension: 3-Understands basic 50 - 74% of the time/requires cueing 25 - 50%  of the time Expression Expression Mode: Verbal Expression: 2-Expresses basic 25 - 49% of the time/requires cueing 50 - 75% of the time. Uses single words/gestures. Social Interaction Social Interaction: 2-Interacts appropriately 25 - 49% of time - Needs frequent redirection. Problem Solving Problem Solving: 1-Solves basic less than 25% of the time - needs direction nearly all the time or does not effectively solve problems and may need a restraint for safety Memory Memory: 2-Recognizes or recalls 25 - 49% of the time/requires cueing 51 - 75% of the time Pain Pain Assessment Pain Assessment: Faces Faces Pain Scale: Hurts little more Pain Type: Acute pain Pain Location: Knee Pain  Orientation: Right Pain Descriptors: Aching Pain Intervention(s): Repositioned  Therapy/Group: Individual Therapy  Roemello Speyer 11/06/2011, 3:35 PM

## 2011-11-06 NOTE — Progress Notes (Signed)
Occupational Therapy Session Note  Patient Details  Name: Mark [name] MRN: [id number] Date of Birth: 1966/05/02  Today's Date: 11/06/2011 Time: 4098-1191 Time Calculation (min): 60 min  Short Term Goals: Week 4:  OT Short Term Goal 1 (Week 4): Pt will don shirt with min A with extra time OT Short Term Goal 2 (Week 4): Pt will wash UB with min A OT Short Term Goal 3 (Week 4): Pt will complete tooth brushing at sink with min cuing  OT Short Term Goal 4 (Week 4): Pt will transfer to [facility] with total A +2 pt 25%  Skilled Therapeutic Interventions/Progress Updates:    1:1 self care retraining with focus on dressing and bathing. Pt performed bed mobility to get to EOB with min A with tactile cues for advancement forward of right hip to scoot towards EOB, donned shirt with min A to adjust trunk once over his trunk, sit to stand X3 for hygiene and change brief after incontinent episode of urine and for clothing management, pt with increased participate and use of left hand to push up from bed into standing with +2 to achieve upright posture. Scoot pivot transfer with mod A going to the right with +2 holding the chair- pt demonstrated increased awareness and initiation during transfer but with limited right knee flexion challenging his ability to turn into chair.  Therapy Documentation Precautions:  Precautions Precautions: Fall Required Braces or Orthoses: Cervical Brace Cervical Brace: Hard collar;Applied in supine position Restrictions Weight Bearing Restrictions: No Pain: Ongoing right knee pain with grimaces - unrated, RN made aware, ice applied to knee See FIM for current functional status  Therapy/Group: Individual Therapy  [name] [facility] 11/06/2011, 11:20 AM

## 2011-11-06 NOTE — Progress Notes (Signed)
Nutrition Follow-up  Psych visit on 5/7. Intake is 50 - 100%. Nurse tech confirms that intake is doing well. Taking supplements as scheduled.  Diet Order:  Regular Supplements: Resource Breeze PO BID and 30 ml Prostat PO BID (provides an additional 644 kcal and 48 grams protein)  Meds: Scheduled Meds:   . chlorhexidine  15 mL Mouth/Throat QID  . citalopram  40 mg Oral QHS  . dantrolene  50 mg Oral QID  . diclofenac sodium   Topical QID  . feeding supplement  30 mL Oral BID  . feeding supplement  1 Container Oral BID BM  . fentaNYL  12.5 mcg Transdermal Q72H  . hydrocerin   Topical BID  . lidocaine-EPINEPHrine  10 mL Other Once  . methylphenidate  5 mg Oral BID WC  . metoprolol tartrate  25 mg Oral BID  . pantoprazole  40 mg Oral Q1200  . tiZANidine  2 mg Oral QHS  . traMADol  50 mg Oral TID WC & HS  . triamcinolone acetonide  40 mg Intra-articular Once  . white petrolatum      . DISCONTD: naphazoline-pheniramine  2 drop Both Eyes QID   Continuous Infusions:  PRN Meds:.acetaminophen, alum & mag hydroxide-simeth, bisacodyl, guaiFENesin-dextromethorphan, hydrocortisone cream, oxyCODONE, polyethylene glycol, polyvinyl alcohol, prochlorperazine, prochlorperazine, prochlorperazine, DISCONTD: polyvinyl alcohol  Labs:  CMP     Component Value Date/Time   NA 141 10/23/2011 1037   K 3.1* 10/23/2011 1037   CL 104 10/23/2011 1037   CO2 25 10/23/2011 1037   GLUCOSE 140* 10/23/2011 1037   BUN 13 10/23/2011 1037   CREATININE 0.84 10/23/2011 1037   CALCIUM 9.9 10/23/2011 1037   PROT 7.2 10/09/2011 0735   ALBUMIN 2.9* 10/09/2011 0735   AST 16 10/09/2011 0735   ALT 22 10/09/2011 0735   ALKPHOS 94 10/09/2011 0735   BILITOT 0.3 10/09/2011 0735   GFRNONAA >90 10/23/2011 1037   GFRAA >90 10/23/2011 1037   CBG (last 3)   Basename 11/04/11 2108  GLUCAP 109*    Intake/Output Summary (Last 24 hours) at 11/06/11 1121 Last data filed at 11/06/11 0800  Gross per 24 hour  Intake  16109 ml  Output       0 ml  Net  21700 ml   Weight Status:  112.2 kg, wt down 2.2 kg x 1 week  Estimated needs:  2500 - 2800 kcal, 145 - 160 grams protien  Nutrition Dx:  Inadequate oral intake now r/t variable appetite AEB variable PO intake and need for supplements to meet kcal and protein goals. Resolved.  Goal:  Pt to consume >/= 75% of estimated needs. Met.  Intervention:  Continue current interventions, pt is progressing well with PO intake.  Monitor:  Weights, labs, I/O's, PO intake  Adair Laundry Pager #:  [phone]

## 2011-11-07 NOTE — Progress Notes (Signed)
Occupational Therapy Session Note  Patient Details  Name: Mark [name] MRN: [id number] Date of Birth: 11-18-1965  Today's Date: 11/07/2011 Time: 1100-1200 Time Calculation (min): 60 min  Short Term Goals: Week 4:  OT Short Term Goal 1 (Week 4): Pt will don shirt with min A with extra time OT Short Term Goal 2 (Week 4): Pt will wash UB with min A OT Short Term Goal 3 (Week 4): Pt will complete tooth brushing at sink with min cuing  OT Short Term Goal 4 (Week 4): Pt will transfer to [facility] with total A +2 pt 25%  Skilled Therapeutic Interventions/Progress Updates:    1:1 self care retraining: focus on dressing and grooming. At w/c level pt able to pick out clothing from dresser with min cuing for sustained attention. Pt able to orient and setup folded shirt in order to don, then required mod A for sustained attention in order to complete task. When donning pants unable to advance left LE to thread needing total A, pt demonstrating problem solving putting pants back on floor to step into pants with right LE. Performed sit to stand with mod A holding on to the end of the bed frame, once standing able to maintain standing with mod A for clothing management of one person!!   Cognitive activity : engaged in playing Wii bowling: focusing on turn taking, sustained attention to turn, simple problem solving, recalling how to operate control, simple learning of new game system (reports usually plays play station)  Therapy Documentation Precautions:  Precautions Precautions: Fall Required Braces or Orthoses: Cervical Brace Cervical Brace: Hard collar;Applied in supine position Restrictions Weight Bearing Restrictions: No Pain:  ongoing pain in shoulders and right knee RN aware  See FIM for current functional status  Therapy/Group: Individual Therapy  [name] [facility] 11/07/2011, 12:09 PM

## 2011-11-07 NOTE — Progress Notes (Signed)
Speech Language Pathology Daily Session Note  Patient Details  Name: Mark [name] MRN: [id number] Date of Birth: 09/03/1965  Today's Date: 11/07/2011 Time: 1330-1410 Time Calculation (min): 40 min  Short Term Goals: Week 4: SLP Short Term Goal 1 (Week 4): Pt will intiate self-feeding with supervision verbal cues SLP Short Term Goal 2 (Week 4): Pt demonstrated sustained attention to a functional task for ~ 2 minutes with Mod A SLP Short Term Goal 3 (Week 4): pt will verball express wants/needs with Mod A semantic cues SLP Short Term Goal 4 (Week 4): Pt will identify 1 physical and 1 cognitive function with Max semantic cues.   Skilled Therapeutic Interventions: Session focused on functional communication and cognition treatment; SLP facilitated session with semantic cues to recall primary therapists' names and moderate assist cues to recall goals; patient demonstrated some awareness by telling this SLP that he cannot remember things very well.  Patient initiated requests for help/expressed wants and needs with moderate assist semantic cues throughout session; Patient utilized humor and understood humor form SLP; however, required cue to utilize appropriate language during session.    Daily Session Precautions/Restrictions    FIM:  Comprehension Comprehension Mode: Auditory Comprehension: 4-Understands basic 75 - 89% of the time/requires cueing 10 - 24% of the time Expression Expression Mode: Verbal Expression: 3-Expresses basic 50 - 74% of the time/requires cueing 25 - 50% of the time. Needs to repeat parts of sentences. Social Interaction Social Interaction: 3-Interacts appropriately 50 - 74% of the time - May be physically or verbally inappropriate. Problem Solving Problem Solving: 1-Solves basic less than 25% of the time - needs direction nearly all the time or does not effectively solve problems and may need a restraint for safety Memory Memory: 2-Recognizes or recalls 25 - 49% of  the time/requires cueing 51 - 75% of the time General    Pain Pain Assessment Pain Assessment: 0-10 Pain Score: 7 Pain Type: Acute pain Pain Location: Knee Pain Orientation: Right Pain Descriptors: Aching Pain Onset: Gradual Patients Stated Pain Goal: 3 Pain Intervention(s): RN made aware, ice, repositioned  Therapy/Group: Individual Therapy  Charlane Ferretti., CCC-SLP [phone]  [name] 11/07/2011, 3:43 PM
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
## 2011-11-08 NOTE — Progress Notes (Signed)
Physical Therapy Note  Patient Details  Name: Mark [name] MRN: [id number] Date of Birth: 01/01/1966 Today's Date: 11/08/2011  9562-1308 (40 minutes) individual Pain: unrated pain RT LE/premedicated Focus of treatment : Therapeutic activities to facilitate sustained attention to task/functional mobility/transfer training Treatment: Pt incontinent of urine; rolling side to side with bed rails min/mod assist ; donning shorts mod assist with patient assisting and cueing for technique (rolling) ; supine to sit mod assist ; sitting edge of bed SBA; donning shirt SBA; transfer sliding board setup + close supervision (level) ; UE ergonometer X 10 minutes . Pt attended to task X 10 minutes using bilateral UEs with cueing to use left.   Cniyah Sproull,JIM 11/08/2011, 9:25 AM
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
## 2011-11-09 NOTE — Progress Notes (Signed)
Physical Therapy Note  Patient Details  Name: [name] MRN: [id number] Date of Birth: [birth year] Today's Date: 11/09/2011  Time: 1015-1100 45 minutes  Pt c/o R knee pain with all activity, eases with rest.  Squat pivot transfer with mod A with decreased lift but improved initiation.  Session focused on gait training focusing on pt initiation of B LE advancement and upright posture.  3 musketeers style gait with pt requiring max A initially to advance L LE but able to advance without A after first 2 steps.  Pt with increased normalization of gt pattern, requires manual facilitation for wt shifting and upright posture.  Attempt gait with eva walker, pt with difficulty problem solving and coordinating control of eva walker and gait, pt required max cuing to advance L LE and maintain upright posture throughout.  Requires + 2 assist due to difficulty controlling advancement of eva walker.  Pt with improved gait pattern with gait with 3 musketeer style.  Attempt stairs, pt unable to lift R LE due to decreased R knee flexion, pt became perseverative on task, unable to stop trying to step up step, pt required total A to sit and to terminate stair climbing movement.  Pt with increased sweating and heavy breathing due to frustration and increased physical demands.  Pt at nurses station with water and cool cloth to rest.  Individual therapy   Brendi Mccarroll 11/09/2011, 11:03 AM
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
## 2011-11-09 NOTE — Progress Notes (Signed)
Physical Therapy Note  Patient Details  Name: Kelso Bibby MRN: [id number] Date of Birth: Mar 27, 1966 Today's Date: 11/09/2011  Time: 1330-1410 40 minutes  Pt c/o pain in L shoulder, RN aware.  Kinetron for B LE activation, sustained attention, and activity tolerance.  Pt required max cues to sustain attention to task in min distracting environment.  Able to activate B LEs without assist to pedal kinetron x 60 seconds at a time, limited activity tolerance and attention to task.  Standing in parallel bars with mod A when pt is able to pull to stand.  Gait in parallel bars with mod A.  Pt with difficulty terminating R LE advancement, requires max verbal and manual cuing to advance L LE.  Pt able to gait 3 x 8' in parallel bars with mod-max A.  SPT training with +2 assist, 3 musketeer style with pt requiring increased assistance as more fatigued.  Pt overall limited by poor attention and decreased frustration tolerance this afternoon.  Individual therapy   Chery Giusto 11/09/2011, 4:13 PM

## 2011-11-09 NOTE — Progress Notes (Signed)
Patient reports pain in left shoulder and ultram 50mg  scheduled managing pain minimally . Patient offered oxycodone and patient refused. Patient tolerating diet well . Patient toileted every 3 hrs with urinal and unable to notify staff prior to urge to void . Patient more verbal and able to communicate effectively . Mother at bedside continue with plan of care. Cleotilde Neer

## 2011-11-09 NOTE — Progress Notes (Signed)
Physical Therapy Weekly Progress Note  Patient Details  Name: Mark [name] MRN: [id number] Date of Birth: 1965-08-24  Today's Date: 11/09/2011  Patient has met 1 of 3 short term goals.  He has improved initiation and functional mobility as well as decreased R knee pain.  Pt is able to assist more with transfers now only requiring mod A and is able to stand with mod A by pulling up on a bar or sink.  Pt able to gait in parallel bars with mod A, requires +2 assist for gait outside parallel bars. Pt is limited by decreased sustained attention and R knee pain making knee flexion difficult and hindering transfers sit to stand.  Patient continues to demonstrate the following deficits: decreased attention and awareness, decreased strength L UE and LE, decreased activity tolerance, increased R knee pain and therefore will continue to benefit from skilled PT intervention to enhance overall performance with activity tolerance, balance, postural control, ability to compensate for deficits, functional use of  left upper extremity and left lower extremity, attention, awareness and coordination.  Patient progressing toward long term goals..  Continue plan of care.  PT Short Term Goals Week 4:  PT Short Term Goal 1 (Week 4): Pt will attend to functional task x 1 minute with min cuing PT Short Term Goal 1 - Progress (Week 4): Progressing toward goal PT Short Term Goal 2 (Week 4): Pt will assist with bed <> chair 60 % of time PT Short Term Goal 2 - Progress (Week 4): Met PT Short Term Goal 3 (Week 4): Pt will roll in bed with mod A PT Short Term Goal 3 - Progress (Week 4): Progressing toward goal Week 5:  PT Short Term Goal 1 (Week 5): Pt will attend to funcitonal task in min distracting environment x 1 minute with min cues PT Short Term Goal 2 (Week 5): Pt will transfer bed <> chair with min A PT Short Term Goal 3 (Week 5): Pt will gait with LRAD with mod A 10' PT Short Term Goal 4 (Week 5): Pt will perform  bed mobility with mod A  Skilled Therapeutic Interventions/Progress Updates:  Ambulation/gait training;Balance/vestibular training;Cognitive remediation/compensation;Discharge planning;DME/adaptive equipment instruction;Functional mobility training;Patient/family education;Stair training;Therapeutic Activities;UE/LE Coordination activities;Wheelchair propulsion/positioning;UE/LE Strength taining/ROM;Splinting/orthotics;Neuromuscular re-education;Therapeutic Exercise;Pain management    See FIM for current functional status    Denaisha Swango 11/09/2011, 6:26 PM

## 2011-11-09 NOTE — Progress Notes (Signed)
Occupational Therapy Session Note  Patient Details  Name: Mark [name] MRN: [id number] Date of Birth: [birth year]  Today's Date: 11/09/2011 Time: 0102-7253 Time Calculation (min): 60 min  Short Term Goals: Week 4:  OT Short Term Goal 1 (Week 4): Pt will don shirt with min A with extra time OT Short Term Goal 2 (Week 4): Pt will wash UB with min A OT Short Term Goal 3 (Week 4): Pt will complete tooth brushing at sink with min cuing  OT Short Term Goal 4 (Week 4): Pt will transfer to [facility] with total A +2 pt 25%  Skilled Therapeutic Interventions/Progress Updates:    1:1 self care retraining: focus on bed mobility with min A, scoot pivot transfer with mod A to the right (with multiple scoots), don shirt with mod cuing and orientation setup with extra time, sustained attention with mod A, threading pants with max A, sit to stand with mod A pulling up on elevated bottom of frame of bed. Tooth brushing with max cuing for sequencing , initiation, task organizations. Heated up breakfast in microwave with mod cuing and extra time, ate with minimal cuing for pacing.   Therapy Documentation Precautions:  Precautions Precautions: Fall Required Braces or Orthoses: Cervical Brace Cervical Brace: Hard collar;Applied in supine position Restrictions Weight Bearing Restrictions: No Pain: Pain Assessment Pain Assessment: No/denies pain  See FIM for current functional status  Therapy/Group: Individual Therapy  [name] [facility] 11/09/2011, 12:41 PM

## 2011-11-09 NOTE — Progress Notes (Signed)
Occupational Therapy Session Note  Patient Details  Name: Mark [name] MRN: [id number] Date of Birth: 1966-04-03  Today's Date: 11/09/2011 Time: 1100-1200 Time Calculation (min): 60 min  Short Term Goals: Week 4:  OT Short Term Goal 1 (Week 4): Pt will don shirt with min A with extra time OT Short Term Goal 2 (Week 4): Pt will wash UB with min A OT Short Term Goal 3 (Week 4): Pt will complete tooth brushing at sink with min cuing  OT Short Term Goal 4 (Week 4): Pt will transfer to [facility] with total A +2 pt 25%  Skilled Therapeutic Interventions/Progress Updates:    Pt engaged in sit to stand activities using parallel bars in front of him to pull up on.  Pt required verbal cues for RLE placement in preparation for standing and when sitting down.  Pt practiced sit<>stand X 6.  Connect 4 game introduced for pt to participate in while standing.  Pt unable to take turns and randomly placed tokens into slots.  Therapist had to physically stop patient from performing activity.  Pt transitioned to tossing rings at target while standing.  Initially pt randomly tossed rings. During second attempt and with max verbal cues patient intentionally aimed at target and tossed rings.  Pt required max verbal cues to initiate tasks and terminate tasks.  Therapy Documentation Precautions:  Precautions Precautions: Fall Required Braces or Orthoses: Cervical Brace Cervical Brace: Hard collar;Applied in supine position Restrictions Weight Bearing Restrictions: No   Pain: Pain Assessment Pain Assessment: No/denies pain  See FIM for current functional status  Therapy/Group: Individual Therapy  [name] 11/09/2011, 12:10 PM

## 2011-11-09 NOTE — Plan of Care (Signed)
Problem: RH BOWEL ELIMINATION Goal: RH OTHER STG BOWEL ELIMINATION GOALS W/ASSIST Other STG Bowel Elimination Goals With min Assistance.  Outcome: Not Progressing Patient toileted every 3 hrs with urinal . Patient not aware  Of incontinence . Continue with plan of care .

## 2011-11-09 NOTE — Progress Notes (Signed)
Speech Language Pathology Daily Session Note  Patient Details  Name: Mark [name] MRN: [id number] Date of Birth: June 29, 1966  Today's Date: 11/09/2011 Time: 8295-6213 Time Calculation (min): 60 min  Short Term Goals: SLP Short Term Goal 1 (Week 4): Pt will intiate self-feeding with supervision verbal cues SLP Short Term Goal 2 (Week 4): Pt demonstrated sustained attention to a functional task for ~ 2 minutes with Mod A SLP Short Term Goal 3 (Week 4): pt will verball express wants/needs with Mod A semantic cues SLP Short Term Goal 4 (Week 4): Pt will identify 1 physical and 1 cognitive function with Max semantic cues.   Skilled Therapeutic Interventions: Treatment focus on working memory, sustained attention, and intellectual awareness. Pt required Mod A verbal and semantic cues to utilize associations to recall categories. Pt also required Max-Total A for sustained attention to task and for redirection with verbal, tactile and visual cues. Pt recalled 2/3 clinicians and required Max A verbal and semantic cues to identify physical and cognitive deficits.   Daily Session FIM:  Comprehension Comprehension Mode: Auditory Comprehension: 4-Understands basic 75 - 89% of the time/requires cueing 10 - 24% of the time Social Interaction Social Interaction: 3-Interacts appropriately 50 - 74% of the time - May be physically or verbally inappropriate. Problem Solving Problem Solving: 1-Solves basic less than 25% of the time - needs direction nearly all the time or does not effectively solve problems and may need a restraint for safety Memory Memory: 2-Recognizes or recalls 25 - 49% of the time/requires cueing 51 - 75% of the time  Pain: No/Denies Pain  Therapy/Group: Individual Therapy  Ariz Terrones 11/09/2011, 3:45 PM

## 2011-11-10 DIAGNOSIS — W320XXA Accidental handgun discharge, initial encounter: Secondary | ICD-10-CM

## 2011-11-10 DIAGNOSIS — Z5189 Encounter for other specified aftercare: Secondary | ICD-10-CM

## 2011-11-10 DIAGNOSIS — M1710 Unilateral primary osteoarthritis, unspecified knee: Secondary | ICD-10-CM

## 2011-11-10 DIAGNOSIS — R259 Unspecified abnormal involuntary movements: Secondary | ICD-10-CM

## 2011-11-10 DIAGNOSIS — S069X9A Unspecified intracranial injury with loss of consciousness of unspecified duration, initial encounter: Secondary | ICD-10-CM

## 2011-11-10 MED ORDER — TIZANIDINE HCL 4 MG PO TABS
4.0000 mg | ORAL_TABLET | Freq: Every day | ORAL | Status: DC
  Administered 2011-11-10 – 2011-11-18 (×9): 4 mg via ORAL
  Filled 2011-11-10 (×10): qty 1

## 2011-11-10 NOTE — Progress Notes (Signed)
Occupational Therapy Session Note  Patient Details  Name: Mark [name] MRN: [id number] Date of Birth: 11-08-1965  Today's Date: 11/10/2011 Time: 1100-1155 Time Calculation (min): 55 min   Skilled Therapeutic Interventions/Progress Updates: Patient seen this am for 1:1 OT session to address functional transfers.  Patient able to effectively utilize grab bar with both hands to pull self to standing, to stabilize, and to pivot feet for stand pivot transfer from wheelchair to elevated toilet seat.  Patient had continent void on toilet in bathroom.  Mom present, and very excited at this accomplishment.  Practiced toilet transfer in ADL apartment with a different grab bar configuration.  Patient varied level of assistance from mod to max assist as fatigue more prevalent.       Therapy Documentation Precautions:  Precautions Precautions: Fall Required Braces or Orthoses: Cervical Brace Cervical Brace: Hard collar;Applied in supine position Restrictions Weight Bearing Restrictions: No    Pain: Right knee not scored  ADL: ADL Eating: Supervision/safety;Moderate cueing Grooming: Minimal assistance;Moderate cueing Upper Body Bathing: Dependent Lower Body Bathing: Dependent Upper Body Dressing: Maximal assistance Lower Body Dressing: Dependent Toileting: Dependent  See FIM for current functional status  Therapy/Group: Individual Therapy  Collier Salina 11/10/2011, 12:14 PM

## 2011-11-10 NOTE — Progress Notes (Signed)
Occupational Therapy Weekly Progress Note  Patient Details  Name: Mark [name] MRN: [id number] Date of Birth: 08/05/1965  Today's Date: 11/10/2011  Patient has met 4 of 4 short term goals.  Pt continues to make good progress. Currently, pt is demonstrating behaviors consistent with a Rancho Level V and is overall Max A for functional problem solving, sustained attention, intellectual awareness and working memory. Pt with improved initiation of functional tasks and expression of wants/needs and social interaction. Pt continues to demonstrate delayed processing and decreased vocal intensity at the phrase level. Pt can don his shirt with supervision and LB dressing with total A. Pt can perform sit to stand with mod A pulling up on a grab bar and perform scoot pivot transfers with mod A. Pt can perform functional ambulation with mod A +2. Patient continues to demonstrate the following deficits: muscle weakness and muscle joint tightness, decreased cardiorespiratoy endurance, impaired timing and sequencing, abnormal tone, unbalanced muscle activation, decreased coordination and decreased motor planning, decreased midline orientation and decreased motor planning, decreased initiation, decreased attention, decreased awareness, decreased problem solving, decreased safety awareness, decreased memory and delayed processing and decreased standing balance and decreased postural control and therefore will continue to benefit   from skilled OT intervention to enhance overall performance with BADL.  Patient progressing toward long term goals..  Continue plan of care.  OT Short Term Goals Week 4:  OT Short Term Goal 1 (Week 4): Pt will don shirt with min A with extra time OT Short Term Goal 1 - Progress (Week 4): Met OT Short Term Goal 2 (Week 4): Pt will wash UB with min A OT Short Term Goal 2 - Progress (Week 4): Met OT Short Term Goal 3 (Week 4): Pt will complete tooth brushing at sink with min cuing  OT Short  Term Goal 3 - Progress (Week 4): Met OT Short Term Goal 4 (Week 4): Pt will transfer to [facility] with total A +2 pt 25% OT Short Term Goal 4 - Progress (Week 4): Met Week 5:  OT Short Term Goal 1 (Week 5): Pt will transfer with mod A stand pivot with grab bar to toilet in bathroom OT Short Term Goal 2 (Week 5): Pt will alert staff of toileting needs 25 % of time  OT Short Term Goal 3 (Week 5): Pt will don pants with mod A sit to stand OT Short Term Goal 4 (Week 5): Pt will perform sit to stand with min A consistantly  Skilled Therapeutic Interventions/Progress Updates:      Therapy Documentation Precautions:  Precautions Precautions: Fall Required Braces or Orthoses: Cervical Brace Cervical Brace: Hard collar;Applied in supine position Restrictions Weight Bearing Restrictions: No Pain: Pain Assessment Pain Assessment: 0-10 Pain Score:   6 Pain Type: Acute pain Pain Location: Neck Pain Orientation: Right Pain Descriptors: Aching;Sore Pain Frequency: Occasional Pain Onset: Gradual Patients Stated Pain Goal: 2 Pain Intervention(s): Medication (See eMAR) (scheduled ultram 50 mg po)    See FIM for current functional status  Therapy/Group: Individual Therapy  [name] [facility] 11/10/2011, 3:12 PM

## 2011-11-10 NOTE — Progress Notes (Signed)
   NEUROPSYCHOLOGY  Name: Mark [name] DOB:  08-11-1965 MRN:  [id number]  Patient seen bedside. Mother present. His affect appeared mostly bright when attentive or engaged but otherwise was blunted. He showed increased verbal initiation but still needs max prompts and cues. Voice sounds a bit louder. He continues to demonstrate delayed processing. He denied currently feeling depressed or having thoughts of suicide. No concerns or questions elicited in regards to his progress or the rehabilitation program.   Gladstone Pih, Ph.D  11/10/11

## 2011-11-10 NOTE — Progress Notes (Signed)
Physical Therapy Session Note  Patient Details  Name: Mark [name] MRN: [id number] Date of Birth: 02-Apr-1966  Today's Date: 11/10/2011 Time: 1015-1058 Time Calculation (min): 43 min  Short Term Goals: Week 5:  PT Short Term Goal 1 (Week 5): Pt will attend to funcitonal task in min distracting environment x 1 minute with min cues PT Short Term Goal 2 (Week 5): Pt will transfer bed <> chair with min A PT Short Term Goal 3 (Week 5): Pt will gait with LRAD with mod A 10' PT Short Term Goal 4 (Week 5): Pt will perform bed mobility with mod A  Skilled Therapeutic Interventions/Progress Updates:    therpeutic activity- sit to stands with mod A and cues from w/c or lower mat, less A from higher surface but unable to flex R knee to get foot under to push up, needs A to get LLE in proper position; played Wii with L inattention to UE but did attend to task with min instruction with mod distraction  Gait training with A for weight shifts but able to safely gait with +71min  A 40 ft and RW  Encouraging pt to speak loudly per ST  Therapy Documentation Precautions:  Precautions Precautions: Fall Pain- none except with attempts at passive R knee flexion   Locomotion : Ambulation Ambulation: Yes Ambulation/Gait Assistance: 4: Min assist Assistive device: Rolling walker Ambulation/Gait Assistance Details: also pushing table but +1 to steer table, controlled environement except for turns to chair to sit, L knee wobble with fatigue, lacked full terminal extension in both knees Gait Gait: Yes Gait Pattern: Impaired    Other Treatments: Treatments Therapeutic Activity: sitting on raised mat playing Wii boxing to enourage functional use of BUE, max cues and intermittent A to use LUE, no LOB  See FIM for current functional status  Therapy/Group: Individual Therapy  Michaelene Song 11/10/2011, 12:19 PM

## 2011-11-10 NOTE — Progress Notes (Signed)
Physical Therapy Session Note  Patient Details  Name: Mark [name] MRN: [id number] Date of Birth: 1966-02-22  Today's Date: 11/10/2011 Time: 4098-1191 Time Calculation (min): 55 min  Short Term Goals: Week 5:  PT Short Term Goal 1 (Week 5): Pt will attend to funcitonal task in min distracting environment x 1 minute with min cues PT Short Term Goal 2 (Week 5): Pt will transfer bed <> chair with min A PT Short Term Goal 3 (Week 5): Pt will gait with LRAD with mod A 10' PT Short Term Goal 4 (Week 5): Pt will perform bed mobility with mod A  Skilled Therapeutic Interventions/Progress Updates:    There-ex to aid strength and ROM LE's for mobility, Passive stretch to hamstrings and hip L and a/arom abduction, heel slides 10 x 2; sidelying L and stretched R hip flexors- pt had significant diffiiculty getting on back initially and would not return to back for RLE stretch Mod A for bed mobility on mat table- Gait with RW mod A 45 ft more A initially to initiate stepping and for weight shift, unable to steer around obstacles  Biodex for postural reeducation- catch game - pt understood direction of game  but abnormal weight shifting - would flex at hips to move but on Random easy was successful, +2 A to get pt safely stepping up and down off Biodex- W/c mobility with max cues to use LUE, decreased consistency of use so he constantly veers L into things and people  Therapy Documentation Precautions: falls Pain:no c/o pain except when lying on L shoulder, c/o shoulder pain, unable to problem solve that he had rolled onto it See FIM for current functional status  Therapy/Group: Individual Therapy  Michaelene Song 11/10/2011, 3:59 PM
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
## 2011-11-10 NOTE — Progress Notes (Signed)
Occupational Therapy Session Note  Patient Details  Name: Mark [name] MRN: [id number] Date of Birth: 1966/04/11  Today's Date: 11/10/2011 Time: 4098-1191 Time Calculation (min): 45 min  Short Term Goals: Week 4:  OT Short Term Goal 1 (Week 4): Pt will don shirt with min A with extra time OT Short Term Goal 2 (Week 4): Pt will wash UB with min A OT Short Term Goal 3 (Week 4): Pt will complete tooth brushing at sink with min cuing  OT Short Term Goal 4 (Week 4): Pt will transfer to [facility] with total A +2 pt 25%  Skilled Therapeutic Interventions/Progress Updates:    1:1 self care retraining with focus on toileting, dressing and grooming. When came in the room pt grunting and reporting trying to have a BM in the bed. Pt with little initiation or awareness to alert staff to ask to go to the BR. Mod A scoot pivot transfer bed to [facility]. Performed sit to stand with using STEADY  As a grab bar to pull up on with mod A, once standing able to stand with steadying A for hygiene to be performed and have brief donned. Practiced sit to stand from w/c using end of bed frame as a grab bar to come into standing , pt able to let go with each hand to A with clothing management . Grooming at sink with mod cuing for initiation and termination of tasks. Practiced propelling around the room and out into hallway with his w/c.  Therapy Documentation Precautions:  Precautions Precautions: Fall Required Braces or Orthoses: Cervical Brace Cervical Brace: Hard collar;Applied in supine position Restrictions Weight Bearing Restrictions: No General:   Vital Signs:   Pain: Pain Assessment Pain Assessment: 0-10 Pain Score:   6 Pain Type: Acute pain Pain Location: Neck Pain Orientation: Right Pain Descriptors: Aching;Sore Pain Frequency: Occasional Pain Onset: Gradual Patients Stated Pain Goal: 2 Pain Intervention(s): Medication (See eMAR) (scheduled ultram 50 mg po)  See FIM for current functional  status  Therapy/Group: Individual Therapy  [name] [facility] 11/10/2011, 1:10 PM

## 2011-11-10 NOTE — Progress Notes (Signed)
Speech Language Pathology Weekly Progress & Session Note  Patient Details  Name: Mark [name] MRN: [id number] Date of Birth: March 27, 1966  Today's Date: 11/10/2011 Time: 9147-8295 Time Calculation (min): 60 min  Short Term Goals:  SLP Short Term Goal 1 (Week 4): Pt will intiate self-feeding with supervision verbal cues SLP Short Term Goal 1 - Progress (Week 4): Met SLP Short Term Goal 2 (Week 4): Pt demonstrated sustained attention to a functional task for ~ 2 minutes with Mod A SLP Short Term Goal 2 - Progress (Week 4): Met SLP Short Term Goal 3 (Week 4): pt will verball express wants/needs with Mod A semantic cues SLP Short Term Goal 3 - Progress (Week 4): Met SLP Short Term Goal 4 (Week 4): Pt will identify 1 physical and 1 cognitive function with Max semantic cues.  SLP Short Term Goal 4 - Progress (Week 4): Met  New Short Term Goals:   SLP Short Term Goal 1 (Week 5): Pt will utilize an increased vocal intensity at the phrase level with Max A verbal and semantic cues.  SLP Short Term Goal 2 (Week 5): Pt will identify 1 physical and 1 cognitive deficit with Mod A semantic and questioning cues.  SLP Short Term Goal 3 (Week 5): Pt will express wants/needs with Min A questioning and semantic cues SLP Short Term Goal 4 (Week 5): pt will demonstrate sustained attention to a functional task for ~5 minutes with Max A verbal and visual cues  SLP Short Term Goal 5 (Week 5): Pt will demonstrate functional problem solving with functional and familiar tasks with Max A verbal and visual cues.   Weekly Progress Updates: Pt has made functional gains and has met 4 out 4 STG's this reporting period. Currently, pt is demonstrating behaviors consistent with a Rancho Level V and is overall Max A for functional problem solving, sustained attention, intellectual awareness and working memory. Pt with improved initiation of functional tasks and expression of wants/needs and social interaction. Pt continues to  demonstrate delayed processing and decreased vocal intensity at the phrase level. Pt would benefit from continued skilled SLP services to maximize cognitive function and overall independence for discharge home with family.   Daily Session Skilled Therapeutic Intervention: Treatment focus on initiation, problem solving, emergent awareness and increased vocal intensity at the phrase level. Pt required Max A verbal and semantic cues to utilize an increased vocal intensity at the phrase level. SLP utilized the telephone to facilitate an increased vocal quality. Pt utilized with Mod A cueing which increased carryover to functional conversation. Mod A verbal and visual cues to plan lunch and dinner meals with a menu. Max A verbal cues to sustain attention to reading task.  FIM:  Comprehension Comprehension Mode: Auditory Comprehension: 3-Understands basic 50 - 74% of the time/requires cueing 25 - 50%  of the time Expression Expression Mode: Verbal Expression: 3-Expresses basic 50 - 74% of the time/requires cueing 25 - 50% of the time. Needs to repeat parts of sentences. Social Interaction Social Interaction: 3-Interacts appropriately 50 - 74% of the time - May be physically or verbally inappropriate. Problem Solving Problem Solving: 2-Solves basic 25 - 49% of the time - needs direction more than half the time to initiate, plan or complete simple activities Memory Memory: 2-Recognizes or recalls 25 - 49% of the time/requires cueing 51 - 75% of the time Pain Pain Assessment Pain Assessment: 0-10 Pain Score:   6 Pain Type: Acute pain Pain Location: Neck Pain Orientation: Right Pain Descriptors:  Aching;Sore Pain Frequency: Occasional Pain Onset: Gradual Patients Stated Pain Goal: 2 Pain Intervention(s): Medication (See eMAR) (scheduled ultram 50 mg po) Cognition: Overall Cognitive Status: Impaired Arousal/Alertness: Awake/alert Orientation Level: Oriented to person;Oriented to place;Oriented to  time Attention: Sustained Focused Attention: Appears intact Sustained Attention: Impaired Sustained Attention Impairment: Verbal basic Selective Attention: Impaired Selective Attention Impairment: Verbal basic;Functional basic Memory: Impaired Memory Impairment: Decreased short term memory;Decreased recall of new information Decreased Short Term Memory: Verbal basic;Functional basic Awareness: Impaired Awareness Impairment: Intellectual impairment Problem Solving: Impaired Problem Solving Impairment: Verbal basic;Functional basic Executive Function:  (all impaired) Behaviors: Perseveration Safety/Judgment: Impaired Rancho Mirant Scales of Cognitive Functioning: Confused/inappropriate/non-agitated Oral/Motor: Oral Motor/Sensory Function Overall Oral Motor/Sensory Function: Appears within functional limits for tasks assessed Motor Speech Phonation: Low vocal intensity Effective Techniques: Increased vocal intensity Comprehension: Auditory Comprehension Overall Auditory Comprehension: Impaired Yes/No Questions: Within Functional Limits Basic Biographical Questions: 76-100% accurate Commands: Within Functional Limits One Step Basic Commands: 75-100% accurate Conversation: Simple Interfering Components: Attention;Processing speed;Working Civil Service fast streamer (initiation) EffectiveTechniques: Wellsite geologist: Within Function Limits Reading Comprehension Reading Status: Within funtional limits Expression: Expression Primary Mode of Expression: Verbal Verbal Expression Overall Verbal Expression: Impaired Initiation: Impaired Automatic Speech: Name;Social Response Level of Generative/Spontaneous Verbalization: Sentence Repetition: No impairment Naming: No impairment Pragmatics: Impairment Impairments: Eye contact;Abnormal affect;Topic appropriateness Interfering Components: Attention Effective  Techniques:  (yes/no questions) Other Verbal Expression Comments: Pt demonstrating increased verbal expression, especially in response to wants/needs. Pt aslo demonstrating increased inappropriate verbal expression.  Written Expression Dominant Hand: Right Written Expression: Exceptions to [facility] Self Formulation Ability: Word Interfering Components: Attention;Left neglect/inattention;Spatial organization;Thought organization  Therapy/Group: Individual Therapy  Jenniferlynn Saad 11/10/2011, 2:18 PM

## 2011-11-11 NOTE — Progress Notes (Signed)
Occupational Therapy Session Note  Patient Details  Name: Mark [name] MRN: [id number] Date of Birth: [birth year]  Today's Date: 11/11/2011 Time: 4098-1191 Time Calculation (min): 45 min  Short Term Goals: Week 5:  OT Short Term Goal 1 (Week 5): Pt will transfer with mod A stand pivot with grab bar to toilet in bathroom OT Short Term Goal 2 (Week 5): Pt will alert staff of toileting needs 25 % of time  OT Short Term Goal 3 (Week 5): Pt will don pants with mod A sit to stand OT Short Term Goal 4 (Week 5): Pt will perform sit to stand with min A consistantly  Skilled Therapeutic Interventions/Progress Updates:    1:1 self care retraining focusing on introducing stand pivot transfers with RW from bed to w/c with mod A with max A to control RW and total cuing for impulsivity, stand pivot transfer using grab bar to transfer to toilet, sit to stand with max a for total A with hygiene- again with max cuing for impulsivity. Lb dressing with increased trunk flexion and initiation to A with threading pants, pulling up socks and fastening shoes with extra time and praise/encouragement. Pt able to don shirt with mod verbal and tactile cuing. Sit to stand with end of bed rail with mod A and pt able to let go (no UE support) to assist pulling up pants.  Therapy Documentation Precautions:  Precautions Precautions: Fall Required Braces or Orthoses: Cervical Brace Cervical Brace: Hard collar;Applied in supine position Restrictions Weight Bearing Restrictions: No Pain:  ongoing discomfort with right knee  See FIM for current functional status  Therapy/Group: Individual Therapy  [name] [facility] 11/11/2011, 3:20 PM

## 2011-11-11 NOTE — Progress Notes (Signed)
Physical Therapy Note  Patient Details  Name: Mark [name] MRN: [id number] Date of Birth: May 15, 1966 Today's Date: 11/11/2011  Time: 1015-1100 45 minutes  No c/o pain.  W/c mobility with B UEs with min A for steering.  Pt able to use B UEs with initial cuing for use of L UE.  Pt min A to propel on carpet and tiled surfaces, requires mod-max cuing for obstacle negotiation.  Sit to stand training with mod A, cues to keep R UE on mat to help push up.  Standing balance with wii bowling game with mod A for balance.  Pt continuously moves R LE when standing ? Due to pain.  Pt with required mod cuing and hand over hand assist for wii bowling in standing, decreased divided attention evident. Seated playing wii game pt was able to perform with min cuing, progressed to mod cuing with hand over hand when fatigued.  Overall pt with improved functional mobility,limited by decreased attention.  Individual therapy   Andrw Mcguirt 11/11/2011, 11:02 AM
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
## 2011-11-11 NOTE — Progress Notes (Signed)
Occupational Therapy Session Note  Patient Details  Name: Mark [name] MRN: [id number] Date of Birth: 08-11-1965  Today's Date: 11/11/2011 Time: 1100-1200 Time Calculation (min): 60 min   Skilled Therapeutic Interventions/Progress Updates:    Pt engaged in standing activity requiring RUE use to toss bean bags at target and add up numerical score to determine winner.  Pt required mod A for sit to stand, min A for standing with mod verbal cues for balance.  Pt constantly repositioned RLE while standing which impacted pt's balance and required therapist's assistance to reestablish balance.  Pt correctly added up score without use of pen and paper.  Pt engaged on Connect 4 using LUE to place tokens in slots.  Pt required mod verbal cues to attend to game and place tokens with purpose and not randomly.  Pt able to state then a correct move was made or an incorrect move was made after the fact.  [name] present and provided verbal cues for pt throughout session.  Therapy Documentation Precautions:  Precautions Precautions: Fall Required Braces or Orthoses: Cervical Brace Cervical Brace: Hard collar;Applied in supine position Restrictions Weight Bearing Restrictions: No   Pain: Pain Assessment Pain Assessment: 0-10 Pain Score:   6 Pain Type: Acute pain Pain Location: Knee Pain Orientation: Right Pain Descriptors: Aching;Throbbing Pain Onset: On-going Pain Intervention(s): RN made aware  See FIM for current functional status  Therapy/Group: Individual Therapy  [name] 11/11/2011, 1:34 PM

## 2011-11-11 NOTE — Progress Notes (Signed)
Recreational Therapy Session Note  Patient Details  Name: Mark [name] MRN: [id number] Date of Birth: 26-Oct-1965 Today's Date: 11/11/2011 Time:  1030-11 Pain: no c/o Skilled Therapeutic Interventions/Progress Updates: sat EOM with supervision to play Will bowling with moderate instructional cues for use of controller to roll the ball.  Pt stood with 1UE on RW to play Wii bowling Mod Assist for balance.  During standing activities, pt continuously moves RLE.  Pt did require hand over hand assist in the beginning to learn new task and then when fatigued.  Therapy/Group: Co-Treatment  Tatumn Corbridge 11/11/2011, 4:08 PM

## 2011-11-11 NOTE — Progress Notes (Signed)
Speech Language Pathology Daily Session Notes  Patient Details  Name: Mark [name] MRN: [id number] Date of Birth: 04-20-1966  Today's Date: 11/11/2011  Session 1 Time: 6295-2841 Time Calculation (min): 60 min  Session 2 Time: 1500-1530  Time Calculation: 30 minutes   Short Term Goals:  SLP Short Term Goal 1 (Week 5): Pt will utilize an increased vocal intensity at the phrase level with Max A verbal and semantic cues.  SLP Short Term Goal 2 (Week 5): Pt will identify 1 physical and 1 cognitive deficit with Mod A semantic and questioning cues.  SLP Short Term Goal 3 (Week 5): Pt will express wants/needs with Min A questioning and semantic cues SLP Short Term Goal 4 (Week 5): pt will demonstrate sustained attention to a functional task for ~5 minutes with Max A verbal and visual cues  SLP Short Term Goal 5 (Week 5): Pt will demonstrate functional problem solving with functional and familiar tasks with Max A verbal and visual cues.   Skilled Therapeutic Interventions:  Session 1: Treatment focus on verbal expression, initiation, problem solving, attention, self monitoring and correcting. Pt required Mod A semantic and verbal cues to utilize an increased vocal intensity at the phrase level. Pt generated items for a specific category with a specific letter with Mod A verbal and semantic cues. Pt required Mod A questioning cues to self-monitor spelling errors and total A for self-correction. Propelled wheelchair with Mod A verbal and visual cues for sustained attention and initiation of task.  Session 2: Treatment focus on working memory, initiation, attention and following directions. Pt transferred to his wheelchair from bed with supervision for placement of his left foot. Pt participated in game of "yahtzee" and required Mod verbal cues for organization and problem solving with task. Pt also required Mod verbal and question cues to utilize increased voice intensity at the phrase level.   Daily  Session FIM:  Comprehension Comprehension Mode: Auditory Comprehension: 3-Understands basic 50 - 74% of the time/requires cueing 25 - 50%  of the time Expression Expression Mode: Verbal Expression: 3-Expresses basic 50 - 74% of the time/requires cueing 25 - 50% of the time. Needs to repeat parts of sentences. Social Interaction Social Interaction: 3-Interacts appropriately 50 - 74% of the time - May be physically or verbally inappropriate. Problem Solving Problem Solving: 2-Solves basic 25 - 49% of the time - needs direction more than half the time to initiate, plan or complete simple activities Memory Memory: 2-Recognizes or recalls 25 - 49% of the time/requires cueing 51 - 75% of the time  Pain: Intermittent pain in right knee   Therapy/Group: Individual Therapy  Mark [name] 11/11/2011, 12:16 PM

## 2011-11-12 ENCOUNTER — Encounter (HOSPITAL_COMMUNITY): Payer: Self-pay | Admitting: *Deleted

## 2011-11-12 NOTE — Progress Notes (Signed)
Physical Therapy Note  Patient Details  Name: Mark [name] MRN: [id number] Date of Birth: [birth year] Today's Date: 11/12/2011  Time: 1400-1430 30 minutes  No c/o pain.  Sit to stand training with focus on forward wt shift.  Pt improved sit to stand and was able to perform with less assistance when using B UEs to push up from mat (vs 1 UE on walker).  Pt impulsive with movements requiring cues to slow down to improve safety and decrease assist level.  Gait training with mod A with RW 2 x 60'.  Pt with 2 LOB when distracted and turning head, required max-total A to correct.  Stair negotiation for 5 steps with B handrails, reciprocal pattern.  Pt required +2 assist, required assist to place B feet on 8'' step. Pt with difficulty terminating tasks, requires max A to terminate stair climbing or gait activities.  Individual therapy   Lachlyn Vanderstelt 11/12/2011, 3:10 PM

## 2011-11-12 NOTE — Progress Notes (Signed)
Occupational Therapy Session Note  Patient Details  Name: Mark [name] MRN: [id number] Date of Birth: [birth year]  Today's Date: 11/12/2011 Time: 1300-1330 Time Calculation (min): 30 min  Short Term Goals: Week 5:  OT Short Term Goal 1 (Week 5): Pt will transfer with mod A stand pivot with grab bar to toilet in bathroom OT Short Term Goal 2 (Week 5): Pt will alert staff of toileting needs 25 % of time  OT Short Term Goal 3 (Week 5): Pt will don pants with mod A sit to stand OT Short Term Goal 4 (Week 5): Pt will perform sit to stand with min A consistantly  Skilled Therapeutic Interventions/Progress Updates:    1:1 focus on toilet transfers stand pivot from w/c with using grab bar with max A (lifting and lowering), standing balance with one UE support for toileting with total A. Pt with difficulty attending to try to have a BM - which pt reported needing to have one. Pt with increased ability to rotate trunk/core for pivot to toilet and w/c Therapy Documentation Precautions:  Precautions Precautions: Fall Required Braces or Orthoses: Cervical Brace Cervical Brace: Hard collar;Applied in supine position Restrictions Weight Bearing Restrictions: No Pain: Pain Assessment Pain Assessment: 0-10 Pain Score:   5 Pain Type: Acute pain Pain Location: Shoulder Pain Orientation: Left Pain Descriptors: Aching;Sore Pain Onset: On-going Patients Stated Pain Goal: 2 Pain Intervention(s): Repositioned;RN made aware  See FIM for current functional status  Therapy/Group: Individual Therapy  [name] [facility] 11/12/2011, 3:18 PM

## 2011-11-12 NOTE — Progress Notes (Signed)
Speech Language Pathology Daily Session Note  Patient Details  Name: Mark [name] MRN: [id number] Date of Birth: Jan 07, 1966  Today's Date: 11/12/2011 Time: 1191-4782 Time Calculation (min): 60 min  Short Term Goals:  SLP Short Term Goal 1 (Week 5): Pt will utilize an increased vocal intensity at the phrase level with Max A verbal and semantic cues.  SLP Short Term Goal 2 (Week 5): Pt will identify 1 physical and 1 cognitive deficit with Mod A semantic and questioning cues.  SLP Short Term Goal 3 (Week 5): Pt will express wants/needs with Min A questioning and semantic cues SLP Short Term Goal 4 (Week 5): pt will demonstrate sustained attention to a functional task for ~5 minutes with Max A verbal and visual cues  SLP Short Term Goal 5 (Week 5): Pt will demonstrate functional problem solving with functional and familiar tasks with Max A verbal and visual cues.   Skilled Therapeutic Interventions: Treatment focus on on working memory, verbal expression of wants/needs, attention and initiation. Pt required Min A verbal and visual cues to initiate self-feeding with breakfast meal. Pt with limited PO intake today. Pt participated in a "scavengar hunt" around the unit to locate different places to focus on in verbal expression, wheelchair propulsion, attention and functional problem solving. Pt required overall Mod A semantic and question cues to utilize functional signs and to ask people for assistance with utilization of increased vocal intensity. Pt required frequent rest breaks due to shoulder pain with Min verbal and visual cues for initiation and attention throughout task.    Daily Session FIM:  Comprehension Comprehension Mode: Auditory Comprehension: 3-Understands basic 50 - 74% of the time/requires cueing 25 - 50%  of the time Expression Expression Mode: Verbal Expression: 3-Expresses basic 50 - 74% of the time/requires cueing 25 - 50% of the time. Needs to repeat parts of  sentences. Social Interaction Social Interaction: 3-Interacts appropriately 50 - 74% of the time - May be physically or verbally inappropriate. Problem Solving Problem Solving: 2-Solves basic 25 - 49% of the time - needs direction more than half the time to initiate, plan or complete simple activities Memory Memory: 2-Recognizes or recalls 25 - 49% of the time/requires cueing 51 - 75% of the time FIM - Eating Eating Activity: 5: Supervision/cues Pain Pain Assessment Pain Assessment: 0-10 Pain Score:   9 Pain Location: Shoulder Pain Orientation: Left Patients Stated Pain Goal: 2 Pain Intervention(s): RN made aware;Medication (See eMAR)  Therapy/Group: Individual Therapy  Yitty Roads 11/12/2011, 11:15 AM

## 2011-11-12 NOTE — Progress Notes (Signed)
Occupational Therapy Session Note  Patient Details  Name: Mark [name] MRN: [id number] Date of Birth: [birth year]  Today's Date: 11/12/2011 Time: 1100-1200 Time Calculation (min): 60 min   Skilled Therapeutic Interventions/Progress Updates:    Pt participated in BUE therapeutic activity seated.  Pt engaged in boxing game on Wii to facilitate LUE use in functional activity.  Pt required mod verbal cues to use LUE during game.  Pt transitioned to Day Room to participate in card game with focus on turn taking, initiation, following rules, and terminating tasks.  Pt required max verbal and tactile cues to take turns/terminate tasks.  Pt required mod verbal cues for following rules of game (game was familiar to patient and he knew the rules from previous occurrences.)  Pt exhibited difficulty with rolling w/c and when asked why he stated he was tired.  Instructed pt to ask for help when he need assistance.  Pt continued to have difficulty and not asking for assistance.  When asked if he needed help he stated yes.  Therapy Documentation Precautions:  Precautions Precautions: Fall Required Braces or Orthoses: Cervical Brace Cervical Brace: Hard collar;Applied in supine position Restrictions Weight Bearing Restrictions: No   Pain: Pain Assessment Pain Assessment: 0-10 Pain Score:   5 Pain Type: Acute pain Pain Location: Shoulder Pain Orientation: Left Pain Descriptors: Aching;Sore Pain Onset: On-going Patients Stated Pain Goal: 2 Pain Intervention(s): Repositioned;RN made aware  See FIM for current functional status  Therapy/Group: Individual Therapy  [name] 11/12/2011, 1:21 PM

## 2011-11-12 NOTE — Progress Notes (Signed)
Nutrition Follow-up  Pt continues to take supplements regularly. Intake remains adequate eating 90 - 100% of meals. Likely meeting elevated needs with current supplementation.  Diet Order:  Regular Supplements: Resource Breeze PO BID and 30 ml Prostat PO BID (provides an additional 644 kcal and 48 grams protein)  Meds: Scheduled Meds:   . chlorhexidine  15 mL Mouth/Throat QID  . citalopram  40 mg Oral QHS  . dantrolene  50 mg Oral QID  . diclofenac sodium   Topical QID  . feeding supplement  30 mL Oral BID  . feeding supplement  1 Container Oral BID BM  . fentaNYL  12.5 mcg Transdermal Q72H  . hydrocerin   Topical BID  . lidocaine-EPINEPHrine  10 mL Other Once  . methylphenidate  5 mg Oral BID WC  . metoprolol tartrate  25 mg Oral BID  . pantoprazole  40 mg Oral Q1200  . tiZANidine  4 mg Oral QHS  . traMADol  50 mg Oral TID WC & HS  . triamcinolone acetonide  40 mg Intra-articular Once   Continuous Infusions:  PRN Meds:.acetaminophen, alum & mag hydroxide-simeth, bisacodyl, guaiFENesin-dextromethorphan, hydrocortisone cream, oxyCODONE, polyethylene glycol, prochlorperazine, prochlorperazine, prochlorperazine  Labs:  CMP     Component Value Date/Time   NA 141 10/23/2011 1037   K 3.1* 10/23/2011 1037   CL 104 10/23/2011 1037   CO2 25 10/23/2011 1037   GLUCOSE 140* 10/23/2011 1037   BUN 13 10/23/2011 1037   CREATININE 0.84 10/23/2011 1037   CALCIUM 9.9 10/23/2011 1037   PROT 7.2 10/09/2011 0735   ALBUMIN 2.9* 10/09/2011 0735   AST 16 10/09/2011 0735   ALT 22 10/09/2011 0735   ALKPHOS 94 10/09/2011 0735   BILITOT 0.3 10/09/2011 0735   GFRNONAA >90 10/23/2011 1037   GFRAA >90 10/23/2011 1037   CBG (last 3)  No results found for this basename: GLUCAP:3 in the last 72 hours   Intake/Output Summary (Last 24 hours) at 11/12/11 1044 Last data filed at 11/12/11 1000  Gross per 24 hour  Intake    480 ml  Output    750 ml  Net   -270 ml   Weight Status:  112.2 kg (wt from  5/8)  Estimated needs:  2500 - 2800 kcal, 145 - 160 grams protein  Nutrition Dx:  Inadequate oral intake now r/t variable appetite AEB variable PO intake and need for supplements to meet kcal and protein goals. Resolved.  Goal:  Pt to consume >/= 75% of estimated needs. Met.  Intervention:  Please obtain new wt as able. Continue current interventions, pt is progressing well with PO intake.  Monitor:  Weights, labs, I/O's, PO intake  Adair Laundry Pager #:  [phone]
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
## 2011-11-12 NOTE — Progress Notes (Signed)
Physical Therapy Note  Patient Details  Name: Mark [name] MRN: [id number] Date of Birth: October 26, 1965 Today's Date: 11/12/2011  LATE ENTRY FOR 5/15  Time: 1345-1415 30 minutes  No c/o pain on PT arrival.  Treatment session focused on training with donning new brace for stretching R knee into flexion.  Representative demo'd and educated therapist on donning brace and treatment protocol.  While pt seated edge of bed, tightening brace, brace snapped and broke.  Rep said this was due to improper positioning of pt. Problem solved with rep, pt and mother how best to don brace to keep it from braking and maximize stretch for optimal results.  Rep will bring new brace next week.  Sit to stand training with mod A from raised bed and side stepping with RW mod-max A.  Pt demos impulsivity with transfers and stepping, requires cues for safety and attention to L LE.  Individual therapy   [name] 11/12/2011, 7:47 AM

## 2011-11-12 NOTE — Progress Notes (Signed)
Occupational Therapy Session Note  Patient Details  Name: Mark [name] MRN: [id number] Date of Birth: 08/12/1965  Today's Date: 11/12/2011 Time: 4098-1191 Time Calculation (min): 45 min  Short Term Goals: Week 5:  OT Short Term Goal 1 (Week 5): Pt will transfer with mod A stand pivot with grab bar to toilet in bathroom OT Short Term Goal 2 (Week 5): Pt will alert staff of toileting needs 25 % of time  OT Short Term Goal 3 (Week 5): Pt will don pants with mod A sit to stand OT Short Term Goal 4 (Week 5): Pt will perform sit to stand with min A consistantly  Skilled Therapeutic Interventions/Progress Updates:    1:1 self care retraining, including bed mobility getting out on right side and scoot pivot transfer to the left with mod A, w/c to tub bench in shower with grab bar with mod A. Focused on sustained attention with mod to max cues and extra time, simple problem solving with mod to max cues, self awareness which is impacted in session by impulsivity and difficulty terminating a physical movement. Able to perform sit to stand pulling up on end of bed frame with min a and then able to let go to pull up pants with steadying A with extra time.  Therapy Documentation Precautions:  Precautions Precautions: Fall Required Braces or Orthoses: Cervical Brace Cervical Brace: Hard collar;Applied in supine position Restrictions Weight Bearing Restrictions: No Pain: Pain Assessment Pain Assessment: 0-10 Pain Score:   9 Pain Location: Shoulder Pain Orientation: Left Patients Stated Pain Goal: 2 Pain Intervention(s): RN made aware;Medication (See eMAR)  See FIM for current functional status  Therapy/Group: Individual Therapy  [name] [facility] 11/12/2011, 11:29 AM

## 2011-11-12 NOTE — Progress Notes (Signed)
Physical Therapy Note  Patient Details  Name: Gal Feldhaus MRN: [id number] Date of Birth: [birth year] Today's Date: 11/12/2011  Time: 1015-1100 45 minutes  Pt c/o L shoulder pain 9/10.  RN aware and meds rec'd.  W/c mobility in controlled environment with min A, cues <10% of time for L UE use.  Gait training in functional situations (bathroom, car) with mod A for gait, max A for SPT with RW to car, toilet due to decreased motor planning and coordination, perseverative movements with R LE, decreased attention to task.  Toileting with +2 assist for hygiene and clothing management, pt was able to be continent on toilet.  Car transfer to simulated Jeep height with mod A to stand, pt +2 assist from lower surfaces due to decreased R knee flexion preventing proper body alignment to stand.  Sit to stand training with +2 assist from w/c height multiple reps with focus on forward wt shift and proper UE placement with mod-max cues.  Gait in controlled environment with mod A 40'x 2, 31' with RW.  Pt with improved mobility and activity tolerance.  Limited by decreased safety awareness and impulsivity with movements.  Individual therapy   Nidhi Jacome 11/12/2011, 11:05 AM

## 2011-11-13 DIAGNOSIS — X749XXA Intentional self-harm by unspecified firearm discharge, initial encounter: Secondary | ICD-10-CM

## 2011-11-13 DIAGNOSIS — S0190XA Unspecified open wound of unspecified part of head, initial encounter: Secondary | ICD-10-CM

## 2011-11-13 DIAGNOSIS — S069X9A Unspecified intracranial injury with loss of consciousness of unspecified duration, initial encounter: Secondary | ICD-10-CM

## 2011-11-13 DIAGNOSIS — F329 Major depressive disorder, single episode, unspecified: Secondary | ICD-10-CM

## 2011-11-13 NOTE — Progress Notes (Signed)
Patient toileted in bathroom mod assist stand pivot transfer with cueing . Continent bowel and bladder this pm large continent bm sitting on  Commode . Staff provided hygiene . Continue to toilet every 3hr in bathroom . Mark [name]

## 2011-11-13 NOTE — Progress Notes (Signed)
Physical Therapy Note  Patient Details  Name: Mark [name] MRN: [id number] Date of Birth: July 22, 1965 Today's Date: 11/13/2011  Time: 1330-1400 30 minutes  Pt c/o L shoulder pain, RN aware.  Standing balance with RW for ball kick with min-mod A.  Pt required mod manual cues at trunk for dynamic balance activity.  Required mod manual cues for kicking with L LE.  Pt with good coordination, decreased balance reactions.  Stair training negotiating 5 stairs with + 2 assist for safety, pt 70%.  Pt able to lift B LEs up stairs without assistance, requires mod manual cues for wt shifts, max verbal and visual cues for sequencing.  Scoot transfers to R side with supervision, L side scoot transfers required mod A, increased cuing for sequencing when transfering to L side.  Individual therapy   [name] 11/13/2011, 2:28 PM

## 2011-11-13 NOTE — Progress Notes (Signed)
Neuropsychology  Name: Mark [name]  MRN: [id number]  Date of Birth: [birth year]  Today's Date: 11/13/2011  He appeared in good spirits when interacting with his mother or staff. No complaints or worries elicited. Memory for staff names and recent events seems to be a relative strength in contrast to initiation and attention. Will continue to monitor.  Gladstone Pih, Ph.D

## 2011-11-13 NOTE — Progress Notes (Signed)
Occupational Therapy Session Note  Patient Details  Name: Mark [name] MRN: [id number] Date of Birth: 01-Feb-1966  Today's Date: 11/13/2011 Time: 4098-1191 Time Calculation (min): 45 min  Short Term Goals: Week 5:  OT Short Term Goal 1 (Week 5): Pt will transfer with mod A stand pivot with grab bar to toilet in bathroom OT Short Term Goal 2 (Week 5): Pt will alert staff of toileting needs 25 % of time  OT Short Term Goal 3 (Week 5): Pt will don pants with mod A sit to stand OT Short Term Goal 4 (Week 5): Pt will perform sit to stand with min A consistantly  Skilled Therapeutic Interventions/Progress Updates:    self care retraining: Pt able to demonstrate increased ability to move left LE with coming to EOB- requiring only min A for rotation of hips to come to EOB. Dressed sitting EOB. Pt able to orient properly a folded t-shirt to don and self corrected with min cuing an error with putting head through arm hole; donning shirt with only supervision. Able to thread pants with min A; with A for feet positioning to go through opening- again with increased ability to move and control left LE, socks were started on each foot and pt able to pull them up properly with extra time. Pt still becomes easily distracted with all functional tasks requiring cuing to redirect to task- in session pt able to identify that he was easily distractive.  (increased self awareness)   Therapy Documentation Precautions:  Precautions Precautions: Fall Required Braces or Orthoses: Cervical Brace Cervical Brace: Hard collar;Applied in supine position Restrictions Weight Bearing Restrictions: No    Pain:  no c/o pain in session    See FIM for current functional status  Therapy/Group: Individual Therapy  [name] [facility] 11/13/2011, 11:50 AM

## 2011-11-13 NOTE — Progress Notes (Signed)
Occupational Therapy Session Note  Patient Details  Name: Mark [name] MRN: [id number] Date of Birth: Sep 25, 1965  Today's Date: 11/13/2011 Time: 1300-1330 Time Calculation (min): 30 min  Short Term Goals: Week 5:  OT Short Term Goal 1 (Week 5): Pt will transfer with mod A stand pivot with grab bar to toilet in bathroom OT Short Term Goal 2 (Week 5): Pt will alert staff of toileting needs 25 % of time  OT Short Term Goal 3 (Week 5): Pt will don pants with mod A sit to stand OT Short Term Goal 4 (Week 5): Pt will perform sit to stand with min A consistantly  Skilled Therapeutic Interventions/Progress Updates:    1:1 self care retraining focus on functional ambulation with RW into bathroom and positioning RW over the toilet to attempt voiding standing as a option; min A for postioning close enough to the toilet and mod A for ambulation. Focused on backing up and turning with RW with mod A for continuing with functional ambulation in the hallway with RW (a minimal distracting environment) with mod cuing for control of RW (veering to the right ) and attention to stepping forward with min A for weight shifts.  Therapy Documentation Precautions:  Precautions Precautions: Fall Required Braces or Orthoses: Cervical Brace Cervical Brace: Hard collar;Applied in supine position Restrictions Weight Bearing Restrictions: No Pain: Pain Assessment Pain Assessment: No/denies pain  See FIM for current functional status  Therapy/Group: Individual Therapy  [name] [facility] 11/13/2011, 3:13 PM

## 2011-11-13 NOTE — Progress Notes (Signed)
Physical Therapy Note  Patient Details  Name: Mark [name] MRN: [id number] Date of Birth: 11/10/1965 Today's Date: 11/13/2011  Time: 1015-1100 45 minutes  Pt c/o shoulder pain with bed mobility, relieved by rest.  W/c mobility with B UEs with min A 50' in household and controlled environments.  Scoot transfers w/c <> mat table with supervision.  Practice bed mobility supine <> sit and rolling multiple attempts to increase core muscle activation and decrease burden of care.  Pt able to roll L with supervision, requires min A for supine <> sit and rolling R.  Multiple attempts with pt progressing from max to min cuing for sequencing.  Gait with RW with focus on turning and RW control.  Pt requires mod manual assist for trunk control on L and for RW control when turning.  Pt able to follow verbal cues to widen BOS and for proper foot placement during gait and turning.  Sit <> stand training with focus on controlled descent and proper hand placement.  Pt improves with repetition.   Individual therapy   Keri Tavella 11/13/2011, 11:04 AM

## 2011-11-13 NOTE — Progress Notes (Signed)
Speech Language Pathology Daily Session Note  Patient Details  Name: Mark [name] MRN: [id number] Date of Birth: 1966-04-23  Today's Date: 11/13/2011 Time: 0915-1010 Time Calculation (min): 55 min  Short Term Goals:  SLP Short Term Goal 1 (Week 5): Pt will utilize an increased vocal intensity at the phrase level with Max A verbal and semantic cues.  SLP Short Term Goal 2 (Week 5): Pt will identify 1 physical and 1 cognitive deficit with Mod A semantic and questioning cues.  SLP Short Term Goal 3 (Week 5): Pt will express wants/needs with Min A questioning and semantic cues SLP Short Term Goal 4 (Week 5): pt will demonstrate sustained attention to a functional task for ~5 minutes with Max A verbal and visual cues  SLP Short Term Goal 5 (Week 5): Pt will demonstrate functional problem solving with functional and familiar tasks with Max A verbal and visual cues.   Skilled Therapeutic Interventions: Treatment focus on working memory,verbel expression, initiation and generating/thought organization. Pt required Max A verbal and semantic cues for utilization of increased vocal intensity at the phrase level. Pt also required Mod A verbal and question cues for verbal expression and following basic commands in regards to a specific task. Pt with increased awareness of cognitive deficits reporting he was having difficulty paying attention to task. Pt with increased turn taking with task with supervision verbal cues. Pt sustained attention to task for ~15 minutes with Mod verbal and visual cues.    Daily Session FIM:  Comprehension Comprehension Mode: Auditory Comprehension: 3-Understands basic 50 - 74% of the time/requires cueing 25 - 50%  of the time Expression Expression Mode: Verbal Expression: 3-Expresses basic 50 - 74% of the time/requires cueing 25 - 50% of the time. Needs to repeat parts of sentences. Social Interaction Social Interaction: 3-Interacts appropriately 50 - 74% of the time - May  be physically or verbally inappropriate. Problem Solving Problem Solving: 2-Solves basic 25 - 49% of the time - needs direction more than half the time to initiate, plan or complete simple activities Memory Memory: 2-Recognizes or recalls 25 - 49% of the time/requires cueing 51 - 75% of the time FIM - Eating Eating Activity: 5: Supervision/cues Pain Pain Assessment Pain Assessment: No/denies pain  Therapy/Group: Individual Therapy  Johathon Overturf 11/13/2011, 2:58 PM

## 2011-11-13 NOTE — Progress Notes (Signed)
Occupational Therapy Note  Patient Details  Name: Mark [name] MRN: [id number] Date of Birth: 05/14/1966 Today's Date: 11/13/2011  Pt engaged in arithmetical activities with focus on attention to task, visual organization, LUE use for selecting correct answer for cards on table.    Pt transitioned to playing dominoes with focus on turn taking, initiation of task, use of LUE, and attention to task.   Pt required mod verbal cues for redirection to task and initiation. Pt transitioned to bathroom for toileting with mod assist for stand pivot using grab bar.  Pt began voiding prior being ready and voided on floor with no apparent distress or initiating asking for assistance.   Lavone Neri [facility] 11/13/2011, 12:13 PM
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
## 2011-11-14 NOTE — Progress Notes (Signed)
Speech Language Pathology Daily Session Note  Patient Details  Name: Mark [name] MRN: [id number] Date of Birth: [birth year]  Today's Date: 11/14/2011 Time: 4098-1191 Time Calculation (min): 33 min  Short Term Goals: Week 5: SLP Short Term Goal 1 (Week 5): Pt will utilize an increased vocal intensity at the phrase level with Max A verbal and semantic cues.  SLP Short Term Goal 2 (Week 5): Pt will identify 1 physical and 1 cognitive deficit with Mod A semantic and questioning cues.  SLP Short Term Goal 3 (Week 5): Pt will express wants/needs with Min A questioning and semantic cues SLP Short Term Goal 4 (Week 5): pt will demonstrate sustained attention to a functional task for ~5 minutes with Max A verbal and visual cues  SLP Short Term Goal 5 (Week 5): Pt will demonstrate functional problem solving with functional and familiar tasks with Max A verbal and visual cues.   Skilled Therapeutic Interventions: Pt distracted through much of session, scratching himself frequently and requiring moderate overall verbal cues to sustain attention.  Acknowledged difficulty concentrating as a deficit since admission; identified physical deficits with greater spontaneity (independently), required mod cues to identify any cognitive changes.  Mod verbal cues needed to increase vocal intensity.  Functional problem-solving task (small purchases/ providing basic change) completed with overall moderate verbal and written cues; min assist for recall of tasks performed during session.  Mod written assist for organization during money sorting task.   Daily Session Precautions/Restrictions    FIM:  Comprehension Comprehension Mode: Auditory Comprehension: 4-Understands basic 75 - 89% of the time/requires cueing 10 - 24% of the time Expression Expression Mode: Verbal Expression: 4-Expresses basic 75 - 89% of the time/requires cueing 10 - 24% of the time. Needs helper to occlude trach/needs to repeat words. Social  Interaction Social Interaction: 4-Interacts appropriately 75 - 89% of the time - Needs redirection for appropriate language or to initiate interaction. Problem Solving Problem Solving: 2-Solves basic 25 - 49% of the time - needs direction more than half the time to initiate, plan or complete simple activities Memory Memory: 2-Recognizes or recalls 25 - 49% of the time/requires cueing 51 - 75% of the time   Pain Pain Assessment Pain Assessment: No/denies pain Therapy/Group: Individual Therapy  Mark [name], [place] CCC/SLP Pager [phone]  Mark [name] 11/14/2011, 12:54 PM

## 2011-11-14 NOTE — Progress Notes (Signed)
Occupational Therapy Session Note  Patient Details  Name: Mark [name] MRN: [id number] Date of Birth: 1965/11/24  Today's Date: 11/14/2011 Time: 0730-0830 Time Calculation (min): 60 min  Skilled Therapeutic Interventions/Progress Updates: Therapeutic dressing today with focus on cognitive skills, motor planning, and voicing needs and  Concerns. Patient often blinked one or the other eye during session - not sure etiology as when asked if he saw double or blurry, he replied, "No."  Also patient not able to voice why he felt the need to do so but stated he was aware that he blinked one eye from time to time. Though patient unable to advance or pivot his feet for squat or stand pivot transfer (once standing at walker), he was able to complete bed to w/c chair transfer via sliding board.    His mother was present for  Therapy today.  Therapy Documentation  Pain: none voiced  See FIM for current functional status  Therapy/Group: Individual Therapy  Bud Face [facility] 11/14/2011, 4:03 PM
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
## 2011-11-14 NOTE — Progress Notes (Signed)
To use toilet for continent BM, to stand with mod assist to stand /pivot, tol well
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
## 2011-11-15 NOTE — Progress Notes (Signed)
Physical Therapy Note  Patient Details  Name: Mark [name] MRN: [id number] Date of Birth: June 01, 1966 Today's Date: 11/15/2011  1000-1055 (55 minutes) individual Pain: no complaint of pain Focus of treatment: Therapeutic exercises to improve AROM bilateral LEs/trunk mobility; gait training RW controlled environment ; sit to stand to improve forward trunk flexion Treatment: Sit to stand from wc mod assist; gait 60 feet X 2 RW min/mod assist to steer AD (steers to right); sit to supine (mat) min assist ; supine to sit min assist (mat); bilateral hip/knee flexion and trunk rotation in supine using therapy ball; sit to stand from raised mat using bedside table in front to increase forward trunk flexion mod assist X 5; wc mobility min/mod assist 120 feet using left LEs/biateral UEs   Suann Klier,JIM 11/15/2011, 10:52 AM

## 2011-11-16 DIAGNOSIS — M1710 Unilateral primary osteoarthritis, unspecified knee: Secondary | ICD-10-CM

## 2011-11-16 DIAGNOSIS — Z5189 Encounter for other specified aftercare: Secondary | ICD-10-CM

## 2011-11-16 DIAGNOSIS — R259 Unspecified abnormal involuntary movements: Secondary | ICD-10-CM

## 2011-11-16 DIAGNOSIS — S069X9A Unspecified intracranial injury with loss of consciousness of unspecified duration, initial encounter: Secondary | ICD-10-CM

## 2011-11-16 DIAGNOSIS — W320XXA Accidental handgun discharge, initial encounter: Secondary | ICD-10-CM

## 2011-11-16 NOTE — Progress Notes (Signed)
Speech Language Pathology Daily Session Note  Patient Details  Name: Mark [name] MRN: [id number] Date of Birth: 12/05/1965  Today's Date: 11/16/2011 Time: 4098-1191 Time Calculation (min): 60 min  Short Term Goals: Week 5: SLP Short Term Goal 1 (Week 5): Pt will utilize an increased vocal intensity at the phrase level with Max A verbal and semantic cues.  SLP Short Term Goal 2 (Week 5): Pt will identify 1 physical and 1 cognitive deficit with Mod A semantic and questioning cues.  SLP Short Term Goal 3 (Week 5): Pt will express wants/needs with Min A questioning and semantic cues SLP Short Term Goal 4 (Week 5): pt will demonstrate sustained attention to a functional task for ~5 minutes with Max A verbal and visual cues  SLP Short Term Goal 5 (Week 5): Pt will demonstrate functional problem solving with functional and familiar tasks with Max A verbal and visual cues.   Skilled Therapeutic Interventions: Session focused on utilizing memory and speech strategies; SLP facilitated session with max faded to mod assist semantic and visual cues to increase vocal intensity with phrase level expression; facilitated session with mod faded to min assist semantic cues to recall and utilize procedures during a set activity with max assist tactile cues to stop turning cards to take turn throughout session.  Patient with mod I verbal expression of wants and needs during session.   Daily Session Precautions/Restrictions    FIM:  Comprehension Comprehension Mode: Auditory Comprehension: 5-Understands basic 90% of the time/requires cueing < 10% of the time Expression Expression Mode: Verbal Expression: 3-Expresses basic 50 - 74% of the time/requires cueing 25 - 50% of the time. Needs to repeat parts of sentences. Social Interaction Social Interaction: 5-Interacts appropriately 90% of the time - Needs monitoring or encouragement for participation or interaction. Problem Solving Problem Solving: 3-Solves  basic 50 - 74% of the time/requires cueing 25 - 49% of the time Memory Memory: 3-Recognizes or recalls 50 - 74% of the time/requires cueing 25 - 49% of the time General    Pain Pain Assessment Pain Assessment: 0-10 Pain Score:   8 Pain Type: Acute pain Pain Location: Head Pain Orientation: Right Pain Descriptors: Aching Pain Frequency: Intermittent Pain Onset: Gradual Patients Stated Pain Goal: 2 Pain Intervention(s): RN made aware;Other (Comment) (administered medication) Multiple Pain Sites: No  Therapy/Group: Individual Therapy  Mark Ferretti., CCC-SLP [phone]  Mark [name] 11/16/2011, 4:12 PM

## 2011-11-16 NOTE — Progress Notes (Signed)
Occupational Therapy Session Note  Patient Details  Name: Mark [name] MRN: [id number] Date of Birth: May 24, 1966  Today's Date: 11/16/2011 Time: 4782-9562 Time Calculation (min): 45 min  Short Term Goals: Week 5:  OT Short Term Goal 1 (Week 5): Pt will transfer with mod A stand pivot with grab bar to toilet in bathroom OT Short Term Goal 2 (Week 5): Pt will alert staff of toileting needs 25 % of time  OT Short Term Goal 3 (Week 5): Pt will don pants with mod A sit to stand OT Short Term Goal 4 (Week 5): Pt will perform sit to stand with min A consistantly  Skilled Therapeutic Interventions/Progress Updates:    1:1 self care retraining with focus on dressing, functional ambulation with RW, sit to stands, standing balance during clothing management and grooming tasks with steadying A, sustained attention for 2-3 min with mod cuing for each task. Pt able to verbalize he is distracted by his dry skin, the noise in the hallway but unable to redirect himself requiring tactile cues to guide dominant hand back to task. Pt able to perform sit to stands with mod A from normal height of EOB.  1:1 2nd session 11:15-12:00 focus on sustained attention in a quiet to minimal distracting environment during a game of "Spot It." Identifying matching objects on card in a moderate distracting field. Timed for 1 min to see how many cards he could match, maximizing his attention to task. Pt needed 3-4 tactile cues in that 1 min to attend to task.  Therapy Documentation Precautions:  Precautions Precautions: Fall  Pain: Pain Assessment Pain Assessment: 0-10 Pain Score:   6 Pain Type: Acute pain Pain Location: Knee Pain Orientation: Right Pain Descriptors: Aching Pain Onset: On-going Patients Stated Pain Goal: 2 Pain Intervention(s): Medication (See eMAR) Multiple Pain Sites: Yes  See FIM for current functional status  Therapy/Group: Individual Therapy  [name] [facility] 11/16/2011, 12:05 PM

## 2011-11-16 NOTE — Progress Notes (Signed)
Physical Therapy Note  Patient Details  Name: Mark [name] MRN: [id number] Date of Birth: June 30, 1965 Today's Date: 11/16/2011  Time 1: 1000-1055 55 minutes  No c/o pain.  Pt with 3x episodes of mm spasms in L LE during prolonged standing or gait, spasms ease with rest, RN aware.  Standing balance training with B UE support with ball kick with min-mod A for balance reactions, max cuing to kick with L LE.  Pt with improved timing and reaction speed vs last week.  Gait training with RW controlled environment 4 x 40', limited by mm spasms.  Pt requires min A for RW control, verbal cues for attention to L LE.  Pt with improved posture and fluidity of gait.  Stand to sit training with focus on controlled sit.  Pt requires mod-max visual and verbal cues for sequencing of proper steps to safely control descent, pt improved with repetition.  Individual therapy   Time 2: 1330-1430 60 minutes  No c/o pain.  Standing balance with 1 UE support with horseshoe toss.  Pt with very delayed initiation of throw, required max cues, counting "1,2,3, throw" in order to complete task.  Focus on improved reaction and initiation time, pt asked to throw 8 horseshoes as fast as he could, required 2 minutes first attempt, improved to 1:42 on second attempt.  Continues to require counting cues to initiate task.  Gait training on carpet to simulate home environment with mod A, cues to pick up feet.  Pt fatigues more easily on carpet, required more frequent rest breaks.  Obstacle negotiation on carpet with mod A, max visual and verbal cues for obstacle negotiation and RW control.  Money counting task with mod cuing for counting and sorting change ? Due to pt fatigue and decreased attention at end of session.  Overall pt with improved activity tolerance, balance and mobility, limited by decreased attention and initiation.  Individual therapy     Macala Baldonado 11/16/2011, 3:42 PM

## 2011-11-16 NOTE — Progress Notes (Signed)
Occupational Therapy Weekly Progress Note  Patient Details  Name: Mark [name] MRN: [id number] Date of Birth: [birth year]  Today's Date: 11/16/2011 Time: 1115-1200 Time Calculation (min): 45 min  Patient has met 3 of 4 short term goals.  Pt continues to make good progress in OT. We have been showering 2x a week. Pt can dress UB with supervision and LB with mod A. Pt can perform sit to stands with mod A for toileting and clothing management. Pt can perform basic transfers with RW with mod A for control. Pt with increased awareness of his decreased attention and how often it impacts his performance in ADL tasks. Pt often times can sustain his attention for 2 min with min to mod cuing (tactile). Pt can perform basic problem solving with min - modA with familiar tasks. Pt's behavior is consistent with VII - Automatic - appropriate.   Patient continues to demonstrate the following deficits: muscle weakness and muscle joint tightness in his right knee, decreased cardiorespiratoy endurance, impaired timing and sequencing, abnormal tone, unbalanced muscle activation and decreased motor planning, decreased initiation, decreased attention, decreased awareness, decreased problem solving, decreased safety awareness, decreased memory and delayed processing and decreased standing balance, decreased postural control and decreased balance strategies and therefore will continue to benefit from skilled OT intervention to enhance overall performance with BADL and Reduce care partner burden.  Patient progressing toward long term goals..  Continue plan of care.  OT Short Term Goals Week 5:  OT Short Term Goal 1 (Week 5): Pt will transfer with mod A stand pivot with grab bar to toilet in bathroom OT Short Term Goal 1 - Progress (Week 5): Met OT Short Term Goal 2 (Week 5): Pt will alert staff of toileting needs 25 % of time  OT Short Term Goal 2 - Progress (Week 5): Met OT Short Term Goal 3 (Week 5): Pt will don pants  with mod A sit to stand OT Short Term Goal 3 - Progress (Week 5): Met OT Short Term Goal 4 (Week 5): Pt will perform sit to stand with min A consistantly OT Short Term Goal 4 - Progress (Week 5): Not met Week 6:  OT Short Term Goal 1 (Week 6): Pt will perform sit to stand with min A consistantly OT Short Term Goal 2 (Week 6): Education with mom on basic transfers and basic bed mobility OT Short Term Goal 3 (Week 6): Pt will perform toileting with max A consistantly OT Short Term Goal 4 (Week 6): Pt will perform grooming of one task in standing at the sink with steadying A    Therapy Documentation Precautions:  Precautions Precautions: Fall Required Braces or Orthoses: Cervical Brace Cervical Brace: Hard collar;Applied in supine position Restrictions Weight Bearing Restrictions: No Pain: Pain Assessment Pain Assessment: 0-10 Pain Score:   5 Pain Type: Acute pain Pain Location: Knee Pain Orientation: Right Pain Descriptors: Aching Pain Onset: On-going Patients Stated Pain Goal: 2 Pain Intervention(s): Medication (See eMAR) Multiple Pain Sites: Yes ADL: ADL Eating: Supervision/safety;Moderate cueing Grooming: Minimal assistance;Moderate cueing Upper Body Bathing: min A Lower Body Bathing: max A Upper Body Dressing: supervision Lower Body Dressing: mod A Toileting: max A  See FIM for current functional status  Therapy/Group: Individual Therapy  [name] [facility] 11/16/2011, 3:38 PM

## 2011-11-16 NOTE — Progress Notes (Signed)
Physical Therapy Weekly Progress Note  Patient Details  Name: Alfonsa Vaile MRN: [id number] Date of Birth: 1966/02/23  Today's Date: 11/16/2011  Patient has met 4 of 4 short term goals.  Pt has improved mobility, gait and balance and is now min-mod A for gait, supervision for scoot transfers, min-mod A for sit to stand.  Pt is supervision with w/c mobility.  Patient continues to demonstrate the following deficits: decreased sustained attention, decreased awareness, delayed initiation, impaired balance, impaired gait, impaired activity tolerance, impaired functional mobiility and therefore will continue to benefit from skilled PT intervention to enhance overall performance with activity tolerance, balance, ability to compensate for deficits, functional use of  left upper extremity and left lower extremity, attention, awareness, coordination and knowledge of precautions.  Patient progressing toward long term goals..  Continue plan of care.  PT Short Term Goals Week 5:  PT Short Term Goal 1 (Week 5): Pt will attend to funcitonal task in min distracting environment x 1 minute with min cues PT Short Term Goal 1 - Progress (Week 5): Met PT Short Term Goal 2 (Week 5): Pt will transfer bed <> chair with min A PT Short Term Goal 2 - Progress (Week 5): Met PT Short Term Goal 3 (Week 5): Pt will gait with LRAD with mod A 10' PT Short Term Goal 3 - Progress (Week 5): Met PT Short Term Goal 4 (Week 5): Pt will perform bed mobility with mod A PT Short Term Goal 4 - Progress (Week 5): Met Week 6:  PT Short Term Goal 1 (Week 6): Pt will perform standing balance x 2 min with min A for functional task PT Short Term Goal 2 (Week 6): Pt will sustain attention to functional task x 2 mins with min A PT Short Term Goal 3 (Week 6): Pt will gait with min A 50' in controlled environment  Skilled Therapeutic Interventions/Progress Updates:  Ambulation/gait training;Balance/vestibular training;Cognitive  remediation/compensation;Discharge planning;DME/adaptive equipment instruction;Functional mobility training;Patient/family education;Pain management;Neuromuscular re-education;Therapeutic Exercise;UE/LE Strength taining/ROM;Splinting/orthotics;Stair training;Therapeutic Activities;UE/LE Coordination activities;Wheelchair propulsion/positioning    See FIM for current functional status    Nina Mondor 11/16/2011, 10:59 AM
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
## 2011-11-16 NOTE — Progress Notes (Signed)
Patient appears to be resting well this shift. BP was elevated this evening, then decreased after medication given. Patient was asymptomatic. See flowsheets for details. Continue plan of care.

## 2011-11-17 LAB — COMPREHENSIVE METABOLIC PANEL
Albumin: 3.2 g/dL — ABNORMAL LOW (ref 3.5–5.2)
BUN: 13 mg/dL (ref 6–23)
Chloride: 105 mEq/L (ref 96–112)
Creatinine, Ser: 0.63 mg/dL (ref 0.50–1.35)
GFR calc non Af Amer: >90 mL/min (ref >90)
Total Bilirubin: 0.2 mg/dL — ABNORMAL LOW (ref 0.3–1.2)

## 2011-11-17 LAB — CBC
HCT: 39.5 % (ref 39.0–52.0)
MCH: 26.4 pg (ref 26.0–34.0)
MCV: 84 fL (ref 78.0–100.0)
RDW: 15.5 % (ref 11.5–15.5)
WBC: 4.5 10*3/uL (ref 4.0–10.5)

## 2011-11-17 MED ORDER — TIZANIDINE HCL 2 MG PO TABS
2.0000 mg | ORAL_TABLET | Freq: Two times a day (BID) | ORAL | Status: DC
  Administered 2011-11-17: 12:00:00 via ORAL
  Administered 2011-11-18 – 2011-11-19 (×4): 2 mg via ORAL
  Filled 2011-11-17 (×7): qty 1

## 2011-11-17 NOTE — Progress Notes (Signed)
Patient has been toileted every 3 hrs and continent using toilet .  Patient wearing underwear throughout day . Continue to encourage using bathroom.          Mark [name]

## 2011-11-17 NOTE — Progress Notes (Signed)
Physical Therapy Note  Patient Details  Name: Mark [name] MRN: [id number] Date of Birth: [birth year] Today's Date: 11/17/2011  Time: 1300-1358 58 minutes  Pt c/o headache, RN made aware.  W/c mobility and gait training outdoors on a variety of surfaces.  Pt required mod A for RW control on uneven surfaces, decreased tolerance to gait outdoors noted, no LOB.  Pt required min A for w/c mobility up inclines, supervision on uneven surfaces.  Gait training on carpet with obstacle negotiation with min A, improved L foot clearance today.  Cognitive training with min cuing for card game with matching, turn taking and attending to task.  Pt with improved sustained attention this afternoon.  Individual therapy   Lexie Morini 11/17/2011, 1:58 PM

## 2011-11-17 NOTE — Progress Notes (Signed)
Speech Language Pathology Weekly Progress & Session Notes  Patient Details  Name: Mark [name] MRN: [id number] Date of Birth: 06/07/1966  Today's Date: 11/17/2011  Session 1 Time: 0930-1030 Time Calculation (min): 60 min  Session 2 Time: 1530-1600  Time Calculation: 30 minutes  Short Term Goals:  SLP Short Term Goal 1 (Week 5): Pt will utilize an increased vocal intensity at the phrase level with Max A verbal and semantic cues.  SLP Short Term Goal 1 - Progress (Week 5): Met SLP Short Term Goal 2 (Week 5): Pt will identify 1 physical and 1 cognitive deficit with Mod A semantic and questioning cues.  SLP Short Term Goal 2 - Progress (Week 5): Met SLP Short Term Goal 3 (Week 5): Pt will express wants/needs with Min A questioning and semantic cues SLP Short Term Goal 3 - Progress (Week 5): Not met SLP Short Term Goal 4 (Week 5): pt will demonstrate sustained attention to a functional task for ~5 minutes with Max A verbal and visual cues  SLP Short Term Goal 4 - Progress (Week 5): Met SLP Short Term Goal 5 (Week 5): Pt will demonstrate functional problem solving with functional and familiar tasks with Max A verbal and visual cues.  SLP Short Term Goal 5 - Progress (Week 5): Met  New Short Term Goals:  SLP Short Term Goal 1 (Week 6): Pt will utilize an increased vocal intensity at the phrase level with Mod A verbal and semantic cues.  SLP Short Term Goal 2 (Week 6): Pt will demonstrate 2 physical and 2 cognitive deficits with Mod A semantic and verbal cues SLP Short Term Goal 3 (Week 6): Pt will express wants/needs with Min A verbal and question cues.  SLP Short Term Goal 4 (Week 6): Pt will demonstrate sustained attention to a functional task for 5 minutes with Mod A verbal and visual cues.  SLP Short Term Goal 5 (Week 6): Pt will demonstrate functional problem solving for basic and familair tasks with Mod A verbal and visual cues.   Weekly Progress Updates: Pt has made great progress and  has met 4 out of 5 STG's this reporting period. Currently, pt is demonstrating behaviors consistent with a Rancho Level VI-VII and has improved to an overall Mod A for functional problem solving with basic tasks, sustained attention, emergent awareness, working memory, initiation and verbal expression of wants/needs. Pt would benefit from continued skilled SLP services to maximize cognitive function and overall independence for discharge home.   Daily Session Skilled Therapeutic Intervention:  Session 1: Treatment focus on new learning with "Blink" card game with focus on complex problem solving and organization. Pt recalled rules of game with supervision verbal cues. Demonstrated functional problem solving and organization with supervision semantic and questioning cues. Pt required Mod A verbal cues to utilize an increased vocal quality at the phrase level. Pt also required Mod A verbal and question cues for sustained attention to functional task of folding clothes.  Session 2: Treatment focus on working memory and sequencing. Pt recalled rules of memory game with supervision question cues and recalled categories with Mod I. Pt sustained attention to task for ~20 minutes with Min A cueing for redirection. Pt sequenced 4 step picture cards with Mod A verbal and semantic cues.  FIM:  Comprehension Comprehension Mode: Auditory Comprehension: 5-Understands basic 90% of the time/requires cueing < 10% of the time Expression Expression Mode: Verbal Expression: 3-Expresses basic 50 - 74% of the time/requires cueing 25 - 50% of the  time. Needs to repeat parts of sentences. Social Interaction Social Interaction: 4-Interacts appropriately 75 - 89% of the time - Needs redirection for appropriate language or to initiate interaction. Problem Solving Problem Solving: 3-Solves basic 50 - 74% of the time/requires cueing 25 - 49% of the time Memory Memory: 4-Recognizes or recalls 75 - 89% of the time/requires  cueing 10 - 24% of the time FIM - Eating Eating Activity: 5: Supervision/cues Pain No/Denies Pain Cognition: Overall Cognitive Status: Impaired Arousal/Alertness: Awake/alert Orientation Level: Oriented X4 Attention: Selective;Sustained Focused Attention: Appears intact Sustained Attention: Impaired Sustained Attention Impairment: Verbal basic;Functional basic Selective Attention: Impaired Selective Attention Impairment: Verbal basic;Functional basic Memory: Impaired Memory Impairment: Decreased short term memory;Decreased recall of new information Decreased Short Term Memory: Verbal basic;Functional basic Awareness: Impaired Awareness Impairment: Emergent impairment Problem Solving: Impaired Problem Solving Impairment: Verbal basic;Functional basic Executive Function:  (all impaired) Behaviors: Perseveration Safety/Judgment: Impaired Rancho [place] Scales of Cognitive Functioning: Confused/appropriate Oral/Motor: Oral Motor/Sensory Function Overall Oral Motor/Sensory Function: Appears within functional limits for tasks assessed Motor Speech Overall Motor Speech: Impaired Phonation: Low vocal intensity Effective Techniques: Increased vocal intensity Comprehension: Auditory Comprehension Overall Auditory Comprehension: Impaired Yes/No Questions: Within Functional Limits Basic Biographical Questions: 76-100% accurate Commands: Within Functional Limits One Step Basic Commands: 75-100% accurate Conversation: Simple Interfering Components: Attention;Processing speed;Working Radio broadcast assistant: Dietitian: Within [employer] Reading Comprehension Reading Status: Within funtional limits Expression: Expression Primary Mode of Expression: Verbal Verbal Expression Overall Verbal Expression: Impaired Initiation: Impaired Automatic Speech: Name;Social Response Level of Generative/Spontaneous  Verbalization: Sentence Repetition: No impairment Naming: No impairment Pragmatics: Impairment Impairments: Abnormal affect;Eye contact;Topic appropriateness Interfering Components: Attention Effective Techniques:  (yes/no questions) Other Verbal Expression Comments: Pt demonstrating increased verbal expression, especially in response to wants/needs. Pt aslo demonstrating increased inappropriate verbal expression.  Written Expression Dominant Hand: Right Written Expression: Exceptions to [facility] Self Formulation Ability: Word Interfering Components: Attention;Left neglect/inattention;Spatial organization;Thought organization  Therapy/Group: Individual Therapy  Tria Noguera 11/17/2011, 2:30 PM

## 2011-11-17 NOTE — Progress Notes (Signed)
Occupational Therapy Session Note  Patient Details  Name: Mark [name] MRN: [id number] Date of Birth: October 08, 1965  Today's Date: 11/17/2011 Time: 0830-0930 Time Calculation (min): 60 min  Short Term Goals: Week 6:  OT Short Term Goal 1 (Week 6): Pt will perform sit to stand with min A consistantly OT Short Term Goal 2 (Week 6): Education with mom on basic transfers and basic bed mobility OT Short Term Goal 3 (Week 6): Pt will perform toileting with max A consistantly OT Short Term Goal 4 (Week 6): Pt will perform grooming of one task in standing at the sink with steadying A  Skilled Therapeutic Interventions/Progress Updates:    1:1 self care retraining: At shower level. Bed mobility with min A, sit to stand from EOB with min A, functional ambulation with RW with mod A to bathroom, difficulty with sitting 2ndary to right knee, focus on turning with RW to transfer into shower stall, able to bathe UB with supervision and encouragement and LB sit to stand with mod A. Ambulated back out to w/c in room to focus on LB dressing with recall of technique to increased success with mod cuing and setup.   Therapy Documentation Precautions:  Precautions Precautions: Fall Required Braces or Orthoses: Cervical Brace Cervical Brace: Hard collar;Applied in supine position Restrictions Weight Bearing Restrictions: No Pain:  no c/o pain   See FIM for current functional status  Therapy/Group: Individual Therapy  [name] [facility] 11/17/2011, 12:48 PM
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
## 2011-11-17 NOTE — Progress Notes (Signed)
Recreational Therapy Session Note  Patient Details  Name: Mark [name] MRN: [id number] Date of Birth: [birth year] Today's Date: 11/17/2011 Time: 04-1128 Pain: no c/o Skilled Therapeutic Interventions/Progress Updates: Out and about on hospital grounds, pt propelled w/c  to 3rd floor vending machine navigating tile and carpeted surfaces with close supervision. Pt required  mod cues for path finding/following directions. Pt problem solved which coins to use and how much change to put into vending machine to make purchase with min cues.  Pt able to purchase items using vending machine without cues.   Therapy/Group: Co-Treatment Gardy Montanari 11/17/2011, 12:42 PM

## 2011-11-17 NOTE — Progress Notes (Signed)
Physical Therapy Note  Patient Details  Name: [name] MRN: [id number] Date of Birth: 04/02/1966 Today's Date: 11/17/2011  Time: 1030-1130 60 minutes  C/o R knee pain with flexion, eases when out of position.  Gait training with min A in controlled environment 3 x 60' with improved stride length, cues < 10% of time for L foot placement, cues for obstacle negotiation with RW.  Stair training 2 x 5 stairs with mod A, assist to lift L LE up 8'' step, max cues for full foot placement on step for safety.  Pt with impulsive behavior with mobility.  W/c mobility to vending machine with pt able to navigate with mod cues for directions.  W/c mobility > 250' on a variety of surfaces.  Pt able to count change to make vending purchases with min cues, able to purchase items without assistance.  Pt with improved attention to w/c task off unit, still limited by R knee pain and L LE spasms with increased mobility.  Individual therapy   Edris Schneck 11/17/2011, 12:04 PM

## 2011-11-18 ENCOUNTER — Telehealth: Payer: Self-pay | Admitting: Physical Medicine & Rehabilitation

## 2011-11-18 NOTE — Telephone Encounter (Signed)
Has been faxing CMN since 11/06/11.  Following up.
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
## 2011-11-18 NOTE — Progress Notes (Signed)
Social Work Patient ID: Mark [name], male   DOB: 03-May-1966, 46 y.o.   MRN: [id number]   Met yesterday with patient and his mother to discuss d/c plans.  Mother had made decision last week that she did not feel she could meet pt's care needs at home and needed to change plan to SNF.  She did discuss this change with patient yesterday afternoon.  I confirmed with him that he understands he will not be going directly home.  No emotional response beyond just nodding head and stating "yea".  Both are pleased with his progress, however, mother continues to feel that placement is best option at this time.  FL2 prepared and sent to area SNFs.   Sophiarose Eades

## 2011-11-18 NOTE — Progress Notes (Signed)
Physical Therapy Note  Patient Details  Name: Dontavion Noxon MRN: [id number] Date of Birth: [birth year] Today's Date: 11/18/2011  Time: 1100-1200 60 minutes  No c/o pain.  Grimace with R knee flexion.  Standing balance without UE support with ball toss with min guard assist, pt with much improved standing balance.  Pt able to stand x 4 minutes before becoming fatigued and needing seated rest.  Pt participated in ball toss with catch, name a team and the city they are from, able to perform task with min questioning cues.  Gait training in controlled and household environments with min A controlled, mod A household for RW control.  Kinetron to increase AAROM of R knee.  Pt continues to be limited by decreased R knee flexion and pain with PROM.  Individual therapy   Tammala Weider 11/18/2011, 12:22 PM
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
## 2011-11-18 NOTE — Progress Notes (Signed)
Recreational Therapy Discharge Summary Patient Details  Name: Mark [name] MRN: [id number] Date of Birth: 29-May-1966 Today's Date: 11/18/2011  Long term goals set: 1  Long term goals met: 1  Comments on progress toward goals: Pt has made great progress toward goal meeting supervision for seated activities with min cues and min assist for standing with min cues to complete simple TR tasks.  Pt is being discharged to SNF tomorrow for 24 hour care and continued therapies.  Reasons goals not met: n/a  Reasons for discharge: discharge from hospital  Follow-up: encourage TR if available at SNF, otherwise encourage involvement in Activities Programming  Patient/family agrees with progress made and goals achieved: Yes  Justina Bertini 11/18/2011, 4:57 PM
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
## 2011-11-18 NOTE — Discharge Summary (Signed)
  NEUROPSYCHOLOGY  Name:  Mark [name] DOB:   05/04/1966 MRN:   [id number]  Mr. Kennebrew is reportedly to be transferred to a SNF tomorrow. I do not recomend follow-up psychological services at this time. His mood and behavior should be monitored for any significant changes.  Gladstone Pih, Ph.D 11/18/11 04:55 PM

## 2011-11-18 NOTE — Progress Notes (Addendum)
Discharge summary # [id number]. RX for fentanyl 12.5 mcg patch #1 RX ritalin 5 mg #10 RX oxycodone IR 5 mg #5 given to SNF.
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
## 2011-11-18 NOTE — Care Management Note (Signed)
Patient ID: Mark [name], male   DOB: Jan 15, 1966, 46 y.o.   MRN: [id number] Update called & faxed to Jesus Aniciete at [facility] [place].

## 2011-11-18 NOTE — Progress Notes (Signed)
Recreational Therapy Session Note  Patient Details  Name: Mark [name] MRN: [id number] Date of Birth: 31-Jul-1965 Today's Date: 11/18/2011 Time:  04-1129 Pain: no c/o Skilled Therapeutic Interventions/Progress Updates: Stood to Leisure centre manager a ball using BUE's with contact guard assist.  Pt stood ~4 minutes x3.  Naming items in a category during ball toss activity with min cues.  Therapy/Group: Co-Treatment  Level of assist: Min Assist  Hanadi Stanly 11/18/2011, 4:12 PM

## 2011-11-18 NOTE — Progress Notes (Signed)
Social Work Patient ID: Mark [name], male   DOB: Nov 10, 1965, 46 y.o.   MRN: [id number]  Met with mother and patient this afternoon to inform them that we have received a SNF bed offer from [facility] in [place] - both accepting offer and plan to transfer pt tomorrow @ 1:00 (likely via ambulance).  Both pleased with offer and facility - no concerns at this time.  Adalynne Steffensmeier

## 2011-11-18 NOTE — Progress Notes (Signed)
Speech Language Pathology Daily Session Notes  Patient Details  Name: Mark [name] MRN: [id number] Date of Birth: March 09, 1966  Today's Date: 11/18/2011  Session 1 Time: 0930-1030 Time Calculation (min): 60 min  Session 2 Time: 1450-1520 Time Calculation: 30 minutes  Short Term Goals:  SLP Short Term Goal 1 (Week 6): Pt will utilize an increased vocal intensity at the phrase level with Mod A verbal and semantic cues.  SLP Short Term Goal 2 (Week 6): Pt will demonstrate 2 physical and 2 cognitive deficits with Mod A semantic and verbal cues SLP Short Term Goal 3 (Week 6): Pt will express wants/needs with Min A verbal and question cues.  SLP Short Term Goal 4 (Week 6): Pt will demonstrate sustained attention to a functional task for 5 minutes with Mod A verbal and visual cues.  SLP Short Term Goal 5 (Week 6): Pt will demonstrate functional problem solving for basic and familair tasks with Mod A verbal and visual cues.   Skilled Therapeutic Interventions:  Session 1: Treatment focus on attention, initiation, problem solving, self-monitoring and correcting and spatial organization. Pt participated in money management task with Mod A semantic and questioning cues to utilize visual aids/manipulation to increase organization and problem solving with counting money. Pt demonstrated increased emergent awareness into decreased attention and initiation of task. Pt was able to verbally self-monitor difficulty but unable to correct. Pt required Mod A visual cues to increase spatial orientation and self-monitor errors throughout written expression task.  Session 2: Treatment focus on reading comprehension and complex auditory comprehension. Pt answered complex yes/no questions with 70% accuracy, auditory comprehension at the paragraph level with 66% accuracy and followed 1-3 step auditory commands with 100% accuracy. Pt also 100% accurate with reading comprehension at the paragraph level, but required  supervision cues for attention to task.    Daily Session FIM:  Comprehension Comprehension Mode: Auditory Comprehension: 5-Understands basic 90% of the time/requires cueing < 10% of the time Expression Expression Mode: Verbal Expression: 3-Expresses basic 50 - 74% of the time/requires cueing 25 - 50% of the time. Needs to repeat parts of sentences. Social Interaction Social Interaction: 4-Interacts appropriately 75 - 89% of the time - Needs redirection for appropriate language or to initiate interaction. Problem Solving Problem Solving: 3-Solves basic 50 - 74% of the time/requires cueing 25 - 49% of the time Memory Memory: 4-Recognizes or recalls 75 - 89% of the time/requires cueing 10 - 24% of the time Pain Pain Assessment Pain Assessment: 0-10 Pain Score:   5 Pain Type: Acute pain Pain Location: Head Pain Orientation: Right Pain Descriptors: Aching Pain Onset: Gradual Patients Stated Pain Goal: 2 Pain Intervention(s): RN made aware  Therapy/Group: Individual Therapy  Mark [name] 11/18/2011, 10:33 AM

## 2011-11-18 NOTE — Progress Notes (Signed)
Physical Therapy Note  Patient Details  Name: Mark [name] MRN: [id number] Date of Birth: 1966/04/11 Today's Date: 11/18/2011  1300-1355 55 minutes  Pt c/o R knee pain, RN made aware.  Bathroom mobility and transfers with mod A for sit to stand, min A for balance.  Standing bean bag game with min A to supervision for standing with 1 UE support.  Pt made to keep score by writing down scores on paper. Pt required min-mod assist for simple math task.  Pt with improved attention to game in mod distracting environment.  Gait training with focus on obstacle negotiation with min A, min cues to avoid obstacles with RW.  Pt required min verbal cues to decrease L toe drag which is evident when fatigued.  Pt demonstrates increased awareness of deficits, stating "I think I need more rehab".  Overall improved activity tolerance, attention and awareness today.  Individual therapy   Missael Ferrari 11/18/2011, 3:42 PM

## 2011-11-19 ENCOUNTER — Telehealth: Payer: Self-pay | Admitting: Physical Medicine & Rehabilitation
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
## 2011-11-19 NOTE — Telephone Encounter (Signed)
Spoke to [place] and let her know that I have not seen anything for Mark [name] and asked if she wouldn't mind re faxing this. She understood and agreed to re fax paperwork.

## 2011-11-19 NOTE — Progress Notes (Signed)
Physical Therapy Note  Patient Details  Name: Mark [name] MRN: [id number] Date of Birth: [birth year] Today's Date: 11/19/2011  Time: 1000-1025 (co-tx with ST) 25 minutes  Pt c/o R knee pain with prolonged standing, rest breaks as needed.  Gait with RW with min A 50', 79' with no cues needed for obstacle negotiation, steadying assist for turns.  W/c mobility with supervision 150' in controlled environment.  Standing balance and tolerance with close supervision-min A with cognitive tasks for greeting people, social interactions.  Pt with improved affect, improved social interactions, smiling and laughing appropriately.  Individual therapy   Chiante Peden 11/19/2011, 10:24 AM

## 2011-11-19 NOTE — Progress Notes (Signed)
Physical Therapy Note  Patient Details  Name: Eeshan Verbrugge MRN: [id number] Date of Birth: 06-Dec-1965 Today's Date: 11/19/2011  Time: 1100-1130 30 minutes  Pt c/o R knee pain, RN made aware.  W/c mobility in household settings, cues for obstacle negotiation, supervision overall.  RW in household setting with min A on uneven surface, cues for safety when fatigued.  Stair training 2 x 5 stairs with + 2 assist for safety, pt 75%.  Pt required mod-max cues for foot placement on step for safety with descending stairs.  Pt limited by impulsive behaviors especially when fatigued.  Overall improved balance and mobility noted.  Individual therapy   Jalicia Roszak 11/19/2011, 11:30 AM

## 2011-11-19 NOTE — Progress Notes (Signed)
Nutrition Follow-up  Pt plan to d/c to SNF today. Weights stable. Pt taking scheduled supplements. Continues to eat mostly 100% of meals.  Diet Order:  Regular  Supplements: Resource Breeze PO BID and 30 ml Prostat PO BID (provides an additional 644 kcal and 48 grams protein)  Meds: Scheduled Meds:   . chlorhexidine  15 mL Mouth/Throat QID  . citalopram  40 mg Oral QHS  . dantrolene  50 mg Oral QID  . diclofenac sodium   Topical QID  . feeding supplement  30 mL Oral BID  . feeding supplement  1 Container Oral BID BM  . fentaNYL  12.5 mcg Transdermal Q72H  . hydrocerin   Topical BID  . lidocaine-EPINEPHrine  10 mL Other Once  . methylphenidate  5 mg Oral BID WC  . metoprolol tartrate  25 mg Oral BID  . pantoprazole  40 mg Oral Q1200  . tiZANidine  2 mg Oral BID  . tiZANidine  4 mg Oral QHS  . traMADol  50 mg Oral TID WC & HS  . triamcinolone acetonide  40 mg Intra-articular Once   Continuous Infusions:  PRN Meds:.acetaminophen, alum & mag hydroxide-simeth, bisacodyl, guaiFENesin-dextromethorphan, hydrocortisone cream, oxyCODONE, polyethylene glycol, prochlorperazine, prochlorperazine, prochlorperazine  Labs:  CMP     Component Value Date/Time   NA 140 11/17/2011 0615   K 3.8 11/17/2011 0615   CL 105 11/17/2011 0615   CO2 23 11/17/2011 0615   GLUCOSE 97 11/17/2011 0615   BUN 13 11/17/2011 0615   CREATININE 0.63 11/17/2011 0615   CALCIUM 9.8 11/17/2011 0615   PROT 7.1 11/17/2011 0615   ALBUMIN 3.2* 11/17/2011 0615   AST 10 11/17/2011 0615   ALT 15 11/17/2011 0615   ALKPHOS 105 11/17/2011 0615   BILITOT 0.2* 11/17/2011 0615   GFRNONAA >90 11/17/2011 0615   GFRAA >90 11/17/2011 0615     Intake/Output Summary (Last 24 hours) at 11/19/11 1002 Last data filed at 11/19/11 0542  Gross per 24 hour  Intake    840 ml  Output   1200 ml  Net   -360 ml   Weight Status:  109.8 kg - wt stable  Estimated needs:  2500 - 2800 kcal, 145 - 160 grams protein  Nutrition Dx:  Inadequate oral  intake now r/t variable appetite AEB variable PO intake and need for supplements to meet kcal and protein goals. Resolved.  Goal:  Pt to consume >/= 75% of estimated needs. Met.  Intervention:  Recommend continuing supplements at SNF to help maintain high kcal/protein needs.  Monitor:  Weights, labs, I/O's, PO intake  Adair Laundry Pager #:  [phone]

## 2011-11-19 NOTE — Progress Notes (Signed)
Occupational Therapy Discharge Summary and Session Note  Patient Details  Name: Mark [name] MRN: [id number] Date of Birth: [birth year]  Today's Date: 11/19/2011 Time: 0800-0900  1:1: Pt engaged in ADL retraining including bathing at walk-in shower level and dressing seated in w/c at sink.  Pt incontinent of bowel while laying in bed.  Pt stated he was aware of this but had not notified staff.  Pt required assistance from therapist to clean up before ambulating with r/w to bathroom.  Pt transferred to toilet to complete toileting.  Pt amb with r/w from toilet to shower seat for bathing.  Pt required min A for sit to stand from tub bench to bathe buttocks.  Pt amb to w/c at sink to complete dressing.  Pt required increased assistance with LB dressing today and required increased verbal cues for redirection this morning.  Focus on task initiation, sequencing, safety awareness, and standing balance.  Discharge Summary Patient has met 11 of 12 long term goals due to improved activity tolerance, improved balance, postural control, ability to compensate for deficits, functional use of  RIGHT lower, LEFT upper and LEFT lower extremity, improved attention, improved awareness and improved coordination.  Pt exhibited steady progress since admission and is min A for bathing, supervision for UB dressing, min to max A for LB dressing, min A for toilet and shower transfers, and min A for toileting.  Pt continues to require min A for selective attention.  Pt requires min A for task initiation and task termination.  Pt uses BUE for functional tasks with occasional verbal cues to use LUE.  Pt exhibits behaviors consistent with Rancho Level VII.  Patient to discharge at [facility] Assist level.  Patient's care partner unavailable to provide the necessary physical and cognitive assistance at discharge.  Therefore, patient is being discharged to skilled nursing facility.    Reasons goals not met: LB Dressing goal unmet, as  patient unable to consistently perform at min assist level, occasionally requires mod assist.  Recommendation:  Patient will benefit from ongoing skilled OT services in skilled nursing facility setting to continue to advance functional skills in the area of BADL and iADL.  Equipment: No equipment provided discharge to SNF  Reasons for discharge: treatment goals met and discharge from hospital  Patient/family agrees with progress made and goals achieved: Yes  OT Discharge Precautions/Restrictions  Precautions Precautions: Fall   Pain Pain Assessment Pain Assessment: No/denies pain ADL ADL Eating: Supervision/safety;Minimal cueing Where Assessed-Eating: Chair Grooming: Supervision/safety Where Assessed-Grooming: Sitting at sink;Wheelchair Upper Body Bathing: Supervision/safety Where Assessed-Upper Body Bathing: Shower Lower Body Bathing: Minimal assistance;Minimal cueing Where Assessed-Lower Body Bathing: Shower Upper Body Dressing: Supervision/safety Where Assessed-Upper Body Dressing: Wheelchair Lower Body Dressing: Moderate assistance Where Assessed-Lower Body Dressing: Standing at sink;Sitting at sink Toileting: Moderate assistance Where Assessed-Toileting: Bedside Commode;Teacher, adult education: Curator Method: Risk manager: Minimal cueing;Minimal assistance Film/video editor Method: Warden/ranger: Shower seat without back Vision/Perception  Vision - History Baseline Vision: No visual deficits Perception Perception: Impaired Praxis Praxis: Impaired Praxis Impairment Details: Initiation;Perseveration  Cognition Overall Cognitive Status: Impaired Arousal/Alertness: Awake/alert Orientation Level: Oriented X4 Attention: Selective Focused Attention: Appears intact Focused Attention Impairment: Verbal basic;Functional basic Sustained Attention: Impaired Sustained Attention Impairment:  Verbal basic;Functional basic Selective Attention: Impaired Selective Attention Impairment: Verbal basic;Functional basic Memory: Impaired Memory Impairment: Decreased short term memory;Decreased recall of new information Decreased Long Term Memory: Functional basic Decreased Short Term Memory: Verbal basic;Functional basic Awareness: Impaired  Awareness Impairment: Emergent impairment Problem Solving: Impaired Problem Solving Impairment: Verbal basic;Functional basic Executive Function:  (all impaired) Behaviors: Impulsive;Perseveration Safety/Judgment: Impaired Comments: impulsive when fatigued [employer] Scales of Cognitive Functioning: Automatic/appropriate Sensation Sensation Light Touch: Impaired by gross assessment Light Touch Impaired Details: Impaired LLE Proprioception: Impaired by gross assessment Additional Comments: impaired proprioception L UE and LE Coordination Gross Motor Movements are Fluid and Coordinated: No Fine Motor Movements are Fluid and Coordinated: No Motor  Motor Motor: Motor perseverations Motor - Discharge Observations: L sided weakness Mobility     Trunk/Postural Assessment  Cervical Assessment Cervical Assessment: Within Functional Limits Thoracic Assessment Thoracic Assessment: Within Functional Limits Lumbar Assessment Lumbar Assessment: Within Functional Limits Postural Control Postural Control: Within Functional Limits Righting Reactions: delayed  Balance Static Sitting Balance Static Sitting - Balance Support: No upper extremity supported Static Sitting - Level of Assistance: 6: Modified independent (Device/Increase time) Extremity/Trunk Assessment RUE Assessment RUE Assessment: Within Functional Limits RUE AROM (degrees) RUE Overall AROM Comments: overall weakness, AROM ~110 RUE Tone RUE Tone: Within Functional Limits LUE Assessment LUE Assessment: Within Functional Limits LUE Tone LUE Tone: Within Functional  Limits  See FIM for current functional status  Mark [name] 11/19/2011, 12:13 PM

## 2011-11-19 NOTE — Progress Notes (Addendum)
Report has been called to [facility], RN of [employer], pt and family is aware that pt is to be transported. Pt has no complaints at this time and VS stable. Pt is waiting to be transported by [facility].

## 2011-11-19 NOTE — Telephone Encounter (Signed)
Dr Riley Kill, please supply dx, CPT, and location for Botox.  Thanks.

## 2011-11-19 NOTE — Plan of Care (Signed)
Problem: RH Dressing Goal: LTG Patient will perform lower body dressing w/assist (OT) LTG: Patient will perform lower body dressing with assist, with/without cues in positioning using equipment (OT)  Outcome: Not Met (add Reason) Inconsistent completion of task at min A level  Comments:  Inconsistent performance of task at min A level

## 2011-11-19 NOTE — Progress Notes (Signed)
Physical Therapy Discharge Summary  Patient Details  Name: Mark [name] MRN: [id number] Date of Birth: June 20, 1966  Today's Date: 11/19/2011  Patient has met 8 of 8 long term goals due to improved activity tolerance, improved balance, improved postural control, increased strength, increased range of motion, decreased pain, ability to compensate for deficits, functional use of  left upper extremity and left lower extremity, improved attention, improved awareness and improved coordination.  Patient to discharge at an ambulatory level Min Assist, w/c with supervision. Pt is able to transfer with scoot transfers with supervision, sit to stand and SPT with min A, gait with RW with min A in controlled and household environments and is supervision with w/c mobility in controlled and household environments.  Pt requires only min cuing for selective attention in home environments during functional tasks, has displayed emergent awareness with min cuing.   Reasons goals not met: n/a  Recommendation:  Patient will benefit from ongoing skilled PT services in skilled nursing facility setting to continue to advance safe functional mobility, address ongoing impairments in strength, mobility, gait, coordination, balance, attention, awareness, initiation, and minimize fall risk.  Equipment: to be provided by SNF  Reasons for discharge: treatment goals met and discharge from hospital  Patient/family agrees with progress made and goals achieved: Yes  PT Discharge Cognition Overall Cognitive Status: Impaired Arousal/Alertness: Awake/alert Orientation Level: Oriented X4 Attention: Selective Selective Attention: Impaired Selective Attention Impairment: Verbal basic;Functional basic Awareness: Impaired Awareness Impairment: Emergent impairment Behaviors: Impulsive;Perseveration Safety/Judgment: Impaired Comments: impulsive when fatigued [place] [employer] Scales of Cognitive Functioning: Automatic/appropriate  (level VI to VII) Sensation Sensation Light Touch: Impaired by gross assessment Light Touch Impaired Details: Impaired LLE Proprioception: Impaired by gross assessment Additional Comments: impaired proprioception L UE and LE Coordination Gross Motor Movements are Fluid and Coordinated: No Fine Motor Movements are Fluid and Coordinated: No Motor  Motor Motor: Motor perseverations Motor - Discharge Observations: L sided weakness   Trunk/Postural Assessment  Cervical Assessment Cervical Assessment: Within Functional Limits Thoracic Assessment Thoracic Assessment: Within Functional Limits Lumbar Assessment Lumbar Assessment: Within Functional Limits Postural Control Postural Control: Within Functional Limits Righting Reactions: delayed  Balance Static Sitting Balance Static Sitting - Balance Support: No upper extremity supported Static Sitting - Level of Assistance: 6: Modified independent (Device/Increase time) Extremity Assessment  RLE Assessment RLE Assessment:  (overall 3/5 except knee 3-/5, R knee flex PROM 50 degrees) LLE Assessment LLE Assessment:  (hip 3-/5, knee 3-/5, ankle 1/5)  See FIM for current functional status  Mark [name] 11/19/2011, 11:37 AM

## 2011-11-19 NOTE — Progress Notes (Signed)
Pt discharged with EMS, fm with pt at discharge.

## 2011-11-19 NOTE — Progress Notes (Signed)
Speech Language Pathology Daily Session Note & Discharge Summary  Patient Details  Name: Mark [name] MRN: [id number] Date of Birth: [birth year]  Today's Date: 11/19/2011 Time: 4098-1191 Time Calculation (min): 30 min  Short Term Goals:  SLP Short Term Goal 1 (Week 6): Pt will utilize an increased vocal intensity at the phrase level with Mod A verbal and semantic cues.  SLP Short Term Goal 2 (Week 6): Pt will demonstrate 2 physical and 2 cognitive deficits with Mod A semantic and verbal cues SLP Short Term Goal 3 (Week 6): Pt will express wants/needs with Min A verbal and question cues.  SLP Short Term Goal 4 (Week 6): Pt will demonstrate sustained attention to a functional task for 5 minutes with Mod A verbal and visual cues.  SLP Short Term Goal 5 (Week 6): Pt will demonstrate functional problem solving for basic and familair tasks with Mod A verbal and visual cues.   Skilled Therapeutic Interventions: Treatment focus on anticipatory awareness and working memory. Pt required Min A semantic and questioning cues to recall current cognitive deficits and Mod A verbal and questioning cues to self-correct errors during functional tasks. Pt able to recall he is discharging to a SNF, but unable to recall name of facility.   Daily Session Precautions/Restrictions  Precautions Precautions: Fall FIM:  Comprehension Comprehension Mode: Auditory Comprehension: 5-Understands basic 90% of the time/requires cueing < 10% of the time Expression Expression Mode: Verbal Expression: 4-Expresses basic 75 - 89% of the time/requires cueing 10 - 24% of the time. Needs helper to occlude trach/needs to repeat words. Social Interaction Social Interaction: 4-Interacts appropriately 75 - 89% of the time - Needs redirection for appropriate language or to initiate interaction. Problem Solving Problem Solving: 4-Solves basic 75 - 89% of the time/requires cueing 10 - 24% of the time Memory Memory: 4-Recognizes or  recalls 75 - 89% of the time/requires cueing 10 - 24% of the time FIM - Eating Eating Activity: 5: Supervision/cues Pain Pain Assessment Pain Assessment: No/denies pain Cognition: Overall Cognitive Status: Impaired Arousal/Alertness: Awake/alert Orientation Level: Oriented X4 Attention: Selective Focused Attention: Appears intact Focused Attention Impairment: Verbal basic;Functional basic Sustained Attention: Impaired Sustained Attention Impairment: Verbal basic;Functional basic Selective Attention: Impaired Selective Attention Impairment: Verbal basic;Functional basic Memory: Impaired Memory Impairment: Decreased short term memory;Decreased recall of new information Decreased Long Term Memory: Functional basic Decreased Short Term Memory: Verbal basic;Functional basic Awareness: Impaired Awareness Impairment: Emergent impairment Problem Solving: Impaired Problem Solving Impairment: Verbal basic;Functional basic Executive Function:  (all impaired) Behaviors: Impulsive;Perseveration Safety/Judgment: Impaired Comments: impulsive when fatigued [employer] Scales of Cognitive Functioning: Automatic/appropriate Oral/Motor: Oral Motor/Sensory Function Overall Oral Motor/Sensory Function: Appears within functional limits for tasks assessed Motor Speech Overall Motor Speech: Impaired Respiration: Within functional limits Phonation: Low vocal intensity;Hoarse Resonance: Within functional limits Articulation: Within functional limitis Intelligibility: Intelligible Motor Planning: Witnin functional limits Effective Techniques: Increased vocal intensity Comprehension: Auditory Comprehension Overall Auditory Comprehension: Impaired Yes/No Questions: Impaired Basic Biographical Questions: 76-100% accurate Complex Questions: 50-74% accurate (impacted by attention) Paragraph Comprehension (via yes/no questions): 51-75% accurate (impacted by attention ) Commands: Impaired One Step  Basic Commands: 75-100% accurate Multistep Basic Commands: 75-100% accurate Conversation: Simple Interfering Components: Attention;Working memory;Processing speed EffectiveTechniques: Buyer, retail: Within Function Limits Reading Comprehension Reading Status: Within funtional limits Expression: Expression Primary Mode of Expression: Verbal Verbal Expression Overall Verbal Expression: Impaired Initiation: Impaired Automatic Speech: Name;Social Response Level of Generative/Spontaneous Verbalization: Sentence Repetition: No impairment Naming: No impairment Pragmatics: Impairment Impairments:  Abnormal affect Interfering Components: Attention Effective Techniques:  (yes/no questions) Other Verbal Expression Comments: Pt demonstrating increased verbal expression, especially in response to wants/needs. Pt aslo demonstrating increased inappropriate verbal expression.  Written Expression Dominant Hand: Right Written Expression: Exceptions to [facility] Self Formulation Ability: Sentence Interfering Components: Attention;Left neglect/inattention;Legibility;Thought organization;Spatial organization  Therapy/Group: Individual Therapy  [place] 11/19/2011, 12:10 PM  Speech Language Pathology Discharge Summary  Patient Details  Name: Mark [name] MRN: [id number] Date of Birth: Nov 03, 1965  Today's Date: 11/19/2011 Time: 4098-1191 Time Calculation (min): 30 min  Patient has made excellent progress and has met 5 of 5 long term goals this admission.  Currently, pt is demonstrating behaviors consistent with a Rancho Level VII and is overall Min A for working memory, initiation with functional tasks, selective attention, emergent awareness, functional problem solving and utilization of increased vocal intensity at the sentence level. Pt is also consuming regular textures with thin liquids and requires Min A verbal  cueing for initiation and attention to self-feeding. Pt will discharge to a SNF for continued rehabilitation.   Reasons goals not met: N/A  Recommendation: Patient will benefit from ongoing skilled SLP services at next venue of care to continue to maximize cognitive function and overall independence.   Equipment: N/A  Reasons for discharge: treatment goals met and discharge from hospital  Patient/family agrees with progress made and goals achieved: Yes  See FIM for current functional status  Rilee Wendling 11/19/2011, 12:11 PM

## 2011-11-20 ENCOUNTER — Encounter (HOSPITAL_COMMUNITY): Payer: Self-pay | Admitting: *Deleted

## 2011-11-20 NOTE — Telephone Encounter (Signed)
342.11  [employer]rice[employer], 300 units [phone]

## 2011-12-02 ENCOUNTER — Encounter
Payer: BC Managed Care – PPO | Attending: Physical Medicine & Rehabilitation | Admitting: Physical Medicine & Rehabilitation

## 2011-12-02 ENCOUNTER — Encounter: Payer: Self-pay | Admitting: Physical Medicine & Rehabilitation

## 2011-12-02 VITALS — BP 139/85 | HR 81 | Ht 78.0 in | Wt 236.0 lb

## 2011-12-02 DIAGNOSIS — L723 Sebaceous cyst: Secondary | ICD-10-CM | POA: Insufficient documentation

## 2011-12-02 DIAGNOSIS — F329 Major depressive disorder, single episode, unspecified: Secondary | ICD-10-CM

## 2011-12-02 DIAGNOSIS — D62 Acute posthemorrhagic anemia: Secondary | ICD-10-CM | POA: Insufficient documentation

## 2011-12-02 DIAGNOSIS — S069X9A Unspecified intracranial injury with loss of consciousness of unspecified duration, initial encounter: Secondary | ICD-10-CM

## 2011-12-02 DIAGNOSIS — M898X9 Other specified disorders of bone, unspecified site: Secondary | ICD-10-CM | POA: Insufficient documentation

## 2011-12-02 DIAGNOSIS — I69959 Hemiplegia and hemiparesis following unspecified cerebrovascular disease affecting unspecified side: Secondary | ICD-10-CM | POA: Insufficient documentation

## 2011-12-02 DIAGNOSIS — L0291 Cutaneous abscess, unspecified: Secondary | ICD-10-CM | POA: Insufficient documentation

## 2011-12-02 DIAGNOSIS — M948X9 Other specified disorders of cartilage, unspecified sites: Secondary | ICD-10-CM

## 2011-12-02 DIAGNOSIS — M619 Calcification and ossification of muscle, unspecified: Secondary | ICD-10-CM | POA: Insufficient documentation

## 2011-12-02 DIAGNOSIS — F3289 Other specified depressive episodes: Secondary | ICD-10-CM | POA: Insufficient documentation
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
## 2011-12-02 NOTE — Patient Instructions (Signed)
1. Re-image the right knee to assess for worsening heterotopic bone at medial knee. (AP and Lateral Xray of Right Knee)  2. Begin etidronate 200mg  TID and 200mg  qhs x 10 days   3. Increase fentanyl patch to to allow for better activity tolerance. Change every 72 hours.  4. CONTINUE ROM TO TOLERANCE- increasing his ROM is paramount.   5. Recommend referral to psychiatry for assessment of meds. I'm hesitant to increase is celexa further as he's already on 40mg  daily. Counseling also would be helpful along with med changes.  6. Warm compresses and expression of material from cyst. I would also start him on prophylactic abx, keflex 250mg  qid x 7 days.  7. Follow up with me in one month.

## 2011-12-07 ENCOUNTER — Telehealth: Payer: Self-pay

## 2011-12-07 NOTE — Telephone Encounter (Signed)
Mark [name] at [facility] would like to know if there is an alternative medication the pt can take instead of etidronate.  This medication is going to cost over $800 dollars.  Please advise.

## 2011-12-11 ENCOUNTER — Telehealth: Payer: Self-pay | Admitting: Physical Medicine & Rehabilitation

## 2011-12-11 NOTE — Telephone Encounter (Signed)
Dantrolene (the generic is on the market)

## 2011-12-11 NOTE — Telephone Encounter (Signed)
Mark [name] inform of medication change.

## 2011-12-11 NOTE — Telephone Encounter (Signed)
Dahlia Bailiff called back re Dantrolene.  When you call back , just call [facility] [place].

## 2011-12-11 NOTE — Telephone Encounter (Signed)
Having trouble getting Ditranel(?).  Is there a substitute?

## 2011-12-11 NOTE — Telephone Encounter (Signed)
Pt is supposed to be taking Dantrium 50mg  1 QID. According to the girl at [facility] this is no longer on the market. Is there an alternative?

## 2011-12-11 NOTE — Telephone Encounter (Signed)
Indomethacin 25mg  tid could be used but it's more for prophylaxis.  Shouldn't take it with other NSAIDS. It is NOT an approved rx for heterotopic ossification.

## 2011-12-14 ENCOUNTER — Telehealth: Payer: Self-pay | Admitting: Physical Medicine & Rehabilitation

## 2011-12-14 NOTE — Telephone Encounter (Signed)
Tried to get info on call.  No one knew the nature they will call back at another time if needed.

## 2011-12-14 NOTE — Telephone Encounter (Signed)
Please call re: Eulis Manly  [phone]

## 2011-12-16 ENCOUNTER — Telehealth: Payer: Self-pay | Admitting: Physical Medicine & Rehabilitation

## 2011-12-16 NOTE — Telephone Encounter (Signed)
Spoke with Mark [name] and clarified that the pt was oked to take indomethacin,  apparentley the message was never forwarded.  Everything should be in order now.

## 2011-12-16 NOTE — Telephone Encounter (Signed)
Dahlia [phone] (page).  Calling about substituting Dantrolene(?)  Called last week.

## 2011-12-23 ENCOUNTER — Inpatient Hospital Stay: Payer: Self-pay | Admitting: Physical Medicine & Rehabilitation

## 2011-12-30 ENCOUNTER — Encounter
Payer: BC Managed Care – PPO | Attending: Physical Medicine & Rehabilitation | Admitting: Physical Medicine & Rehabilitation

## 2011-12-30 ENCOUNTER — Encounter: Payer: Self-pay | Admitting: Physical Medicine & Rehabilitation

## 2011-12-30 VITALS — BP 152/92 | HR 62 | Resp 16 | Ht 75.0 in | Wt 232.0 lb

## 2011-12-30 DIAGNOSIS — S069XAS Unspecified intracranial injury with loss of consciousness status unknown, sequela: Secondary | ICD-10-CM | POA: Insufficient documentation

## 2011-12-30 DIAGNOSIS — S069X9S Unspecified intracranial injury with loss of consciousness of unspecified duration, sequela: Secondary | ICD-10-CM | POA: Insufficient documentation

## 2011-12-30 DIAGNOSIS — M948X9 Other specified disorders of cartilage, unspecified sites: Secondary | ICD-10-CM

## 2011-12-30 DIAGNOSIS — F3289 Other specified depressive episodes: Secondary | ICD-10-CM | POA: Insufficient documentation

## 2011-12-30 DIAGNOSIS — D62 Acute posthemorrhagic anemia: Secondary | ICD-10-CM | POA: Insufficient documentation

## 2011-12-30 DIAGNOSIS — H5440 Blindness, one eye, unspecified eye: Secondary | ICD-10-CM | POA: Insufficient documentation

## 2011-12-30 DIAGNOSIS — M898X9 Other specified disorders of bone, unspecified site: Secondary | ICD-10-CM

## 2011-12-30 DIAGNOSIS — M949 Disorder of cartilage, unspecified: Secondary | ICD-10-CM | POA: Insufficient documentation

## 2011-12-30 DIAGNOSIS — L723 Sebaceous cyst: Secondary | ICD-10-CM | POA: Insufficient documentation

## 2011-12-30 DIAGNOSIS — IMO0002 Reserved for concepts with insufficient information to code with codable children: Secondary | ICD-10-CM | POA: Insufficient documentation

## 2011-12-30 DIAGNOSIS — F329 Major depressive disorder, single episode, unspecified: Secondary | ICD-10-CM

## 2011-12-30 DIAGNOSIS — S069X9A Unspecified intracranial injury with loss of consciousness of unspecified duration, initial encounter: Secondary | ICD-10-CM

## 2011-12-30 DIAGNOSIS — M899 Disorder of bone, unspecified: Secondary | ICD-10-CM | POA: Insufficient documentation

## 2011-12-30 DIAGNOSIS — L0291 Cutaneous abscess, unspecified: Secondary | ICD-10-CM | POA: Insufficient documentation

## 2011-12-30 DIAGNOSIS — G8110 Spastic hemiplegia affecting unspecified side: Secondary | ICD-10-CM | POA: Insufficient documentation

## 2011-12-30 DIAGNOSIS — M25569 Pain in unspecified knee: Secondary | ICD-10-CM | POA: Insufficient documentation

## 2011-12-30 MED ORDER — ETIDRONATE DISODIUM 400 MG PO TABS: 400.0000 mg | ORAL_TABLET | Freq: Two times a day (BID) | ORAL | Status: DC
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
## 2011-12-30 NOTE — Patient Instructions (Addendum)
TAKE YOUR RITALIN ONCE DAILY UNTIL TABLETS ARE GONE  TAKE DIDRONEL (ETIDRONATE) 400MG  TWICE DAILY.

## 2012-01-01 ENCOUNTER — Telehealth: Payer: Self-pay | Admitting: Physical Medicine & Rehabilitation

## 2012-01-01 NOTE — Telephone Encounter (Signed)
Jannet Askew would like orders to see patient for more OT until transitions to OP [phone]

## 2012-01-01 NOTE — Telephone Encounter (Signed)
Orders given.  

## 2012-01-04 ENCOUNTER — Telehealth: Payer: Self-pay | Admitting: Physical Medicine & Rehabilitation

## 2012-01-04 NOTE — Telephone Encounter (Signed)
Debbie PT Genevieve Norlander [phone] PT initiated 1wk/1, 2wk/1, 3wk/2.  Please call with verbal.

## 2012-01-05 NOTE — Telephone Encounter (Signed)
Lm giving verbal ok to continue care.

## 2012-01-12 ENCOUNTER — Telehealth: Payer: Self-pay | Admitting: Physical Medicine & Rehabilitation

## 2012-01-12 MED ORDER — TRAMADOL HCL 50 MG PO TABS: 50.0000 mg | ORAL_TABLET | Freq: Four times a day (QID) | ORAL | Status: AC | PRN

## 2012-01-12 NOTE — Telephone Encounter (Signed)
Please advise 

## 2012-01-12 NOTE — Telephone Encounter (Signed)
Pts mother informed of medication.

## 2012-01-12 NOTE — Telephone Encounter (Signed)
Speech therapist states that patient is complaining of pain in head at level 8.  Around the crown and down the back of neck.  It is a new pain and has gotten worse.  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

## 2012-01-12 NOTE — Telephone Encounter (Signed)
We can call in ultram. Otherwise he would need to be seen in the office. i have sent to [facility]

## 2012-01-13 ENCOUNTER — Telehealth: Payer: Self-pay | Admitting: Orthopedic Surgery

## 2012-01-13 NOTE — Telephone Encounter (Signed)
Needed a letter stating brother Jamarr Treinen was here because of his brother's illness from 3/18-3/22. Will draft one and let them know when it's ready.

## 2012-01-15 ENCOUNTER — Telehealth: Payer: Self-pay | Admitting: Physical Medicine & Rehabilitation

## 2012-01-15 DIAGNOSIS — S069X9A Unspecified intracranial injury with loss of consciousness of unspecified duration, initial encounter: Secondary | ICD-10-CM

## 2012-01-15 NOTE — Telephone Encounter (Signed)
This referral okay? 

## 2012-01-15 NOTE — Telephone Encounter (Signed)
done

## 2012-01-15 NOTE — Telephone Encounter (Signed)
Please fax referral to [facility] for OT, Speech, PT.

## 2012-01-18 ENCOUNTER — Telehealth: Payer: Self-pay

## 2012-01-18 NOTE — Telephone Encounter (Signed)
Pts mother called to let us know he is having increase head and mouth pain.  She would like a call back. [phone]
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
## 2012-01-19 MED ORDER — TOPIRAMATE 50 MG PO TABS
50.0000 mg | ORAL_TABLET | Freq: Every day | ORAL | Status: DC
Start: 2012-01-19 — End: 2012-01-22

## 2012-01-19 NOTE — Telephone Encounter (Signed)
i have written for topamax. Please follow bottle instructions. If pain doesn't improve, of if he demonstrates other neurological changes (ie decreased mentation, increased weakness, et) he will need to be checked out here in the office.

## 2012-01-20 NOTE — Telephone Encounter (Signed)
[place] S[place] informed of medication change and will call if the pt changes any.

## 2012-01-22 ENCOUNTER — Other Ambulatory Visit: Payer: Self-pay | Admitting: *Deleted

## 2012-01-22 MED ORDER — TOPIRAMATE 50 MG PO TABS: 50.0000 mg | ORAL_TABLET | Freq: Every day | ORAL | Status: DC

## 2012-01-25 ENCOUNTER — Telehealth: Payer: Self-pay | Admitting: Physical Medicine & Rehabilitation

## 2012-01-25 MED ORDER — HYDROCODONE-ACETAMINOPHEN 5-325 MG PO TABS: 1.0000 | ORAL_TABLET | Freq: Four times a day (QID) | ORAL | Status: AC | PRN

## 2012-01-25 NOTE — Telephone Encounter (Signed)
Will not give patient Topamax due to side effects.  Please Rx something else.

## 2012-01-25 NOTE — Telephone Encounter (Signed)
So he didn't get the topamax at all?  For further severe headache sx he needs to be seen.  i'll give him a few norco for now until he comes in

## 2012-01-25 NOTE — Telephone Encounter (Signed)
Pt mother aware that we will call in Hydrocodone for him.

## 2012-02-03 ENCOUNTER — Ambulatory Visit: Payer: Medicaid Other | Admitting: Speech Pathology

## 2012-02-03 ENCOUNTER — Ambulatory Visit: Payer: Medicaid Other | Attending: Physical Medicine & Rehabilitation | Admitting: *Deleted

## 2012-02-03 ENCOUNTER — Ambulatory Visit: Payer: Medicaid Other | Admitting: Occupational Therapy

## 2012-02-03 DIAGNOSIS — Z5189 Encounter for other specified aftercare: Secondary | ICD-10-CM | POA: Insufficient documentation

## 2012-02-03 DIAGNOSIS — M6281 Muscle weakness (generalized): Secondary | ICD-10-CM | POA: Insufficient documentation

## 2012-02-03 DIAGNOSIS — I69998 Other sequelae following unspecified cerebrovascular disease: Secondary | ICD-10-CM | POA: Insufficient documentation

## 2012-02-03 DIAGNOSIS — M25519 Pain in unspecified shoulder: Secondary | ICD-10-CM | POA: Insufficient documentation

## 2012-02-03 DIAGNOSIS — R269 Unspecified abnormalities of gait and mobility: Secondary | ICD-10-CM | POA: Insufficient documentation

## 2012-02-03 DIAGNOSIS — R279 Unspecified lack of coordination: Secondary | ICD-10-CM | POA: Insufficient documentation

## 2012-02-03 DIAGNOSIS — I69919 Unspecified symptoms and signs involving cognitive functions following unspecified cerebrovascular disease: Secondary | ICD-10-CM | POA: Insufficient documentation

## 2012-02-09 ENCOUNTER — Ambulatory Visit: Payer: Medicaid Other | Admitting: *Deleted

## 2012-02-09 ENCOUNTER — Ambulatory Visit: Payer: Medicaid Other

## 2012-02-09 ENCOUNTER — Ambulatory Visit: Payer: Medicaid Other | Admitting: Occupational Therapy

## 2012-02-12 ENCOUNTER — Ambulatory Visit: Payer: BC Managed Care – PPO | Admitting: *Deleted

## 2012-02-12 ENCOUNTER — Ambulatory Visit: Payer: Medicaid Other | Admitting: Occupational Therapy

## 2012-02-12 ENCOUNTER — Ambulatory Visit: Payer: Medicaid Other

## 2012-02-12 ENCOUNTER — Ambulatory Visit: Payer: Medicaid Other | Admitting: *Deleted

## 2012-02-17 ENCOUNTER — Ambulatory Visit: Payer: Medicaid Other | Admitting: Physical Therapy

## 2012-02-17 ENCOUNTER — Ambulatory Visit: Payer: Medicaid Other | Admitting: Occupational Therapy

## 2012-02-17 ENCOUNTER — Ambulatory Visit: Payer: Medicaid Other | Admitting: Speech Pathology

## 2012-02-22 ENCOUNTER — Encounter: Payer: BC Managed Care – PPO | Admitting: Speech Pathology

## 2012-02-22 ENCOUNTER — Ambulatory Visit: Payer: BC Managed Care – PPO | Admitting: *Deleted

## 2012-02-22 ENCOUNTER — Encounter: Payer: BC Managed Care – PPO | Admitting: Occupational Therapy

## 2012-02-24 ENCOUNTER — Ambulatory Visit: Payer: Medicaid Other | Admitting: Speech Pathology

## 2012-02-24 ENCOUNTER — Ambulatory Visit: Payer: Medicaid Other | Admitting: Occupational Therapy

## 2012-02-24 ENCOUNTER — Ambulatory Visit: Payer: Medicaid Other | Admitting: Physical Therapy

## 2012-03-02 ENCOUNTER — Encounter: Payer: Self-pay | Admitting: Physical Medicine & Rehabilitation

## 2012-03-02 ENCOUNTER — Encounter: Payer: Medicaid Other | Attending: Physical Medicine & Rehabilitation | Admitting: Physical Medicine & Rehabilitation

## 2012-03-02 VITALS — BP 122/71 | HR 61 | Resp 12 | Ht 75.0 in | Wt 231.4 lb

## 2012-03-02 DIAGNOSIS — IMO0002 Reserved for concepts with insufficient information to code with codable children: Secondary | ICD-10-CM | POA: Insufficient documentation

## 2012-03-02 DIAGNOSIS — F329 Major depressive disorder, single episode, unspecified: Secondary | ICD-10-CM

## 2012-03-02 DIAGNOSIS — S069X9A Unspecified intracranial injury with loss of consciousness of unspecified duration, initial encounter: Secondary | ICD-10-CM

## 2012-03-02 DIAGNOSIS — M898X9 Other specified disorders of bone, unspecified site: Secondary | ICD-10-CM

## 2012-03-02 DIAGNOSIS — F3289 Other specified depressive episodes: Secondary | ICD-10-CM | POA: Insufficient documentation

## 2012-03-02 DIAGNOSIS — S069XAA Unspecified intracranial injury with loss of consciousness status unknown, initial encounter: Secondary | ICD-10-CM | POA: Insufficient documentation

## 2012-03-02 DIAGNOSIS — G8110 Spastic hemiplegia affecting unspecified side: Secondary | ICD-10-CM | POA: Insufficient documentation

## 2012-03-02 MED ORDER — CITALOPRAM HYDROBROMIDE 40 MG PO TABS: 40.0000 mg | ORAL_TABLET | Freq: Every day | ORAL | Status: DC

## 2012-03-02 MED ORDER — METOPROLOL TARTRATE 25 MG PO TABS: 25.0000 mg | ORAL_TABLET | Freq: Two times a day (BID) | ORAL | Status: DC

## 2012-03-02 NOTE — Progress Notes (Signed)
Botox Injection for spasticity using needle EMG guidance  Dilution: 100 Units/ml Indication: Severe spasticity which interferes with ADL,mobility and/or  hygiene and is unresponsive to medication management and other conservative care Informed consent was obtained after describing risks and benefits of the procedure with the patient. This includes bleeding, bruising, infection, excessive weakness, or medication side effects. A REMS form is on file and signed. Needle: 50mm 25g injectable monopolar needle electrode Number of units per muscle Semitendinosus: 75 u semimembranosus 75 u bicepsfemoris 150u  All injections were done after obtaining appropriate EMG activity and after negative drawback for blood. The patient tolerated the procedure well. Post procedure instructions were given. A followup appointment was made for 2 months.   I also refilled celexa and toprol today.

## 2012-03-02 NOTE — Patient Instructions (Signed)
Call me with questions. Continue with therapies as you are

## 2012-03-07 ENCOUNTER — Encounter: Payer: BC Managed Care – PPO | Admitting: Occupational Therapy

## 2012-03-07 ENCOUNTER — Ambulatory Visit: Payer: BC Managed Care – PPO | Admitting: *Deleted

## 2012-03-09 ENCOUNTER — Ambulatory Visit: Payer: Medicaid Other | Admitting: Occupational Therapy

## 2012-03-09 ENCOUNTER — Ambulatory Visit: Payer: Medicaid Other | Admitting: Speech Pathology

## 2012-03-09 ENCOUNTER — Ambulatory Visit: Payer: Medicaid Other | Attending: Physical Medicine & Rehabilitation | Admitting: *Deleted

## 2012-03-09 DIAGNOSIS — M6281 Muscle weakness (generalized): Secondary | ICD-10-CM | POA: Insufficient documentation

## 2012-03-09 DIAGNOSIS — R269 Unspecified abnormalities of gait and mobility: Secondary | ICD-10-CM | POA: Insufficient documentation

## 2012-03-09 DIAGNOSIS — R279 Unspecified lack of coordination: Secondary | ICD-10-CM | POA: Insufficient documentation

## 2012-03-09 DIAGNOSIS — I69919 Unspecified symptoms and signs involving cognitive functions following unspecified cerebrovascular disease: Secondary | ICD-10-CM | POA: Insufficient documentation

## 2012-03-09 DIAGNOSIS — Z5189 Encounter for other specified aftercare: Secondary | ICD-10-CM | POA: Insufficient documentation

## 2012-03-09 DIAGNOSIS — M25519 Pain in unspecified shoulder: Secondary | ICD-10-CM | POA: Insufficient documentation

## 2012-03-09 DIAGNOSIS — I69998 Other sequelae following unspecified cerebrovascular disease: Secondary | ICD-10-CM | POA: Insufficient documentation

## 2012-03-16 ENCOUNTER — Ambulatory Visit: Payer: Medicaid Other | Admitting: *Deleted

## 2012-03-16 ENCOUNTER — Ambulatory Visit: Payer: Medicaid Other | Admitting: Physical Therapy

## 2012-03-23 ENCOUNTER — Ambulatory Visit: Payer: Medicaid Other

## 2012-03-23 ENCOUNTER — Ambulatory Visit: Payer: Medicaid Other | Admitting: Physical Therapy

## 2012-03-23 ENCOUNTER — Ambulatory Visit: Payer: Medicaid Other | Admitting: Occupational Therapy

## 2012-03-30 ENCOUNTER — Ambulatory Visit: Payer: Medicaid Other | Admitting: Speech Pathology

## 2012-03-30 ENCOUNTER — Ambulatory Visit: Payer: Medicaid Other | Admitting: Occupational Therapy

## 2012-03-30 ENCOUNTER — Ambulatory Visit: Payer: Medicaid Other | Attending: Physical Medicine & Rehabilitation | Admitting: Physical Therapy

## 2012-03-30 DIAGNOSIS — R279 Unspecified lack of coordination: Secondary | ICD-10-CM | POA: Insufficient documentation

## 2012-03-30 DIAGNOSIS — Z5189 Encounter for other specified aftercare: Secondary | ICD-10-CM | POA: Insufficient documentation

## 2012-03-30 DIAGNOSIS — I69998 Other sequelae following unspecified cerebrovascular disease: Secondary | ICD-10-CM | POA: Insufficient documentation

## 2012-03-30 DIAGNOSIS — I69919 Unspecified symptoms and signs involving cognitive functions following unspecified cerebrovascular disease: Secondary | ICD-10-CM | POA: Insufficient documentation

## 2012-03-30 DIAGNOSIS — M25519 Pain in unspecified shoulder: Secondary | ICD-10-CM | POA: Insufficient documentation

## 2012-03-30 DIAGNOSIS — M6281 Muscle weakness (generalized): Secondary | ICD-10-CM | POA: Insufficient documentation

## 2012-03-30 DIAGNOSIS — R269 Unspecified abnormalities of gait and mobility: Secondary | ICD-10-CM | POA: Insufficient documentation

## 2012-04-05 ENCOUNTER — Ambulatory Visit: Payer: Medicaid Other | Admitting: Speech Pathology

## 2012-04-05 ENCOUNTER — Ambulatory Visit: Payer: Medicaid Other | Admitting: Occupational Therapy

## 2012-04-06 ENCOUNTER — Ambulatory Visit: Payer: Medicaid Other | Admitting: Speech Pathology

## 2012-04-06 ENCOUNTER — Ambulatory Visit: Payer: BC Managed Care – PPO | Admitting: *Deleted

## 2012-04-06 ENCOUNTER — Ambulatory Visit: Payer: Medicaid Other | Admitting: Occupational Therapy

## 2012-04-13 ENCOUNTER — Ambulatory Visit: Payer: Medicaid Other | Admitting: Occupational Therapy

## 2012-04-13 ENCOUNTER — Ambulatory Visit: Payer: BC Managed Care – PPO | Admitting: *Deleted

## 2012-04-13 ENCOUNTER — Ambulatory Visit: Payer: Medicaid Other | Admitting: Speech Pathology

## 2012-05-02 ENCOUNTER — Encounter: Payer: Self-pay | Attending: Physical Medicine & Rehabilitation | Admitting: Physical Medicine & Rehabilitation

## 2012-05-02 ENCOUNTER — Encounter: Payer: Self-pay | Admitting: Physical Medicine & Rehabilitation

## 2012-05-02 VITALS — BP 122/61 | HR 74 | Resp 14 | Ht 75.0 in | Wt 237.0 lb

## 2012-05-02 DIAGNOSIS — M948X9 Other specified disorders of cartilage, unspecified sites: Secondary | ICD-10-CM

## 2012-05-02 DIAGNOSIS — S069X9A Unspecified intracranial injury with loss of consciousness of unspecified duration, initial encounter: Secondary | ICD-10-CM | POA: Insufficient documentation

## 2012-05-02 DIAGNOSIS — F329 Major depressive disorder, single episode, unspecified: Secondary | ICD-10-CM | POA: Insufficient documentation

## 2012-05-02 DIAGNOSIS — IMO0002 Reserved for concepts with insufficient information to code with codable children: Secondary | ICD-10-CM | POA: Insufficient documentation

## 2012-05-02 DIAGNOSIS — G8110 Spastic hemiplegia affecting unspecified side: Secondary | ICD-10-CM

## 2012-05-02 DIAGNOSIS — M898X9 Other specified disorders of bone, unspecified site: Secondary | ICD-10-CM

## 2012-05-02 DIAGNOSIS — F3289 Other specified depressive episodes: Secondary | ICD-10-CM | POA: Insufficient documentation

## 2012-05-02 DIAGNOSIS — S069XAA Unspecified intracranial injury with loss of consciousness status unknown, initial encounter: Secondary | ICD-10-CM | POA: Insufficient documentation

## 2012-05-02 MED ORDER — TRAMADOL HCL 50 MG PO TABS: 50.0000 mg | ORAL_TABLET | Freq: Three times a day (TID) | ORAL | Status: DC | PRN

## 2012-05-02 NOTE — Patient Instructions (Signed)
PACE YOURSELF. DON'T PUSH YOURSELF UNTIL YOU ARE IN TERRIBLE PAIN. TAKE BREAKS WHEN YOU CAN. GET ADEQUATE SLEEP
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
## 2012-06-14 DIAGNOSIS — X58XXXA Exposure to other specified factors, initial encounter: Secondary | ICD-10-CM

## 2012-06-14 DIAGNOSIS — S069X9A Unspecified intracranial injury with loss of consciousness of unspecified duration, initial encounter: Secondary | ICD-10-CM

## 2012-06-14 DIAGNOSIS — F09 Unspecified mental disorder due to known physiological condition: Secondary | ICD-10-CM

## 2012-06-16 DIAGNOSIS — R41840 Attention and concentration deficit: Secondary | ICD-10-CM

## 2012-06-16 DIAGNOSIS — S069X9A Unspecified intracranial injury with loss of consciousness of unspecified duration, initial encounter: Secondary | ICD-10-CM

## 2012-06-16 DIAGNOSIS — R413 Other amnesia: Secondary | ICD-10-CM

## 2012-06-16 DIAGNOSIS — R41844 Frontal lobe and executive function deficit: Secondary | ICD-10-CM

## 2012-06-16 DIAGNOSIS — X58XXXA Exposure to other specified factors, initial encounter: Secondary | ICD-10-CM

## 2012-07-06 ENCOUNTER — Encounter: Payer: BC Managed Care – PPO | Admitting: Physical Medicine & Rehabilitation

## 2012-07-26 ENCOUNTER — Encounter
Payer: BC Managed Care – PPO | Attending: Physical Medicine & Rehabilitation | Admitting: Physical Medicine & Rehabilitation

## 2012-07-26 ENCOUNTER — Encounter: Payer: Self-pay | Admitting: Physical Medicine & Rehabilitation

## 2012-07-26 VITALS — BP 108/64 | HR 64 | Resp 14 | Ht 75.0 in | Wt 232.0 lb

## 2012-07-26 DIAGNOSIS — M948X9 Other specified disorders of cartilage, unspecified sites: Secondary | ICD-10-CM

## 2012-07-26 DIAGNOSIS — M898X9 Other specified disorders of bone, unspecified site: Secondary | ICD-10-CM

## 2012-07-26 DIAGNOSIS — IMO0002 Reserved for concepts with insufficient information to code with codable children: Secondary | ICD-10-CM | POA: Insufficient documentation

## 2012-07-26 DIAGNOSIS — S069XAA Unspecified intracranial injury with loss of consciousness status unknown, initial encounter: Secondary | ICD-10-CM

## 2012-07-26 DIAGNOSIS — G8110 Spastic hemiplegia affecting unspecified side: Secondary | ICD-10-CM

## 2012-07-26 DIAGNOSIS — L309 Dermatitis, unspecified: Secondary | ICD-10-CM | POA: Insufficient documentation

## 2012-07-26 DIAGNOSIS — F329 Major depressive disorder, single episode, unspecified: Secondary | ICD-10-CM

## 2012-07-26 DIAGNOSIS — F3289 Other specified depressive episodes: Secondary | ICD-10-CM

## 2012-07-26 DIAGNOSIS — L723 Sebaceous cyst: Secondary | ICD-10-CM

## 2012-07-26 DIAGNOSIS — L259 Unspecified contact dermatitis, unspecified cause: Secondary | ICD-10-CM

## 2012-07-26 DIAGNOSIS — S069X9A Unspecified intracranial injury with loss of consciousness of unspecified duration, initial encounter: Secondary | ICD-10-CM

## 2012-07-26 NOTE — Patient Instructions (Signed)
TRY MOIST, WARM COMPRESSES TO CYSTS.
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
## 2012-09-02 IMAGING — CR DG KNEE 1-2V*R*
2 series · 2 of 2 positions shown · non-contrast
Comparison: 10/15/2011

CLINICAL DATA: Pain

RIGHT KNEE - 1-2 VIEW

[x knee ap right]
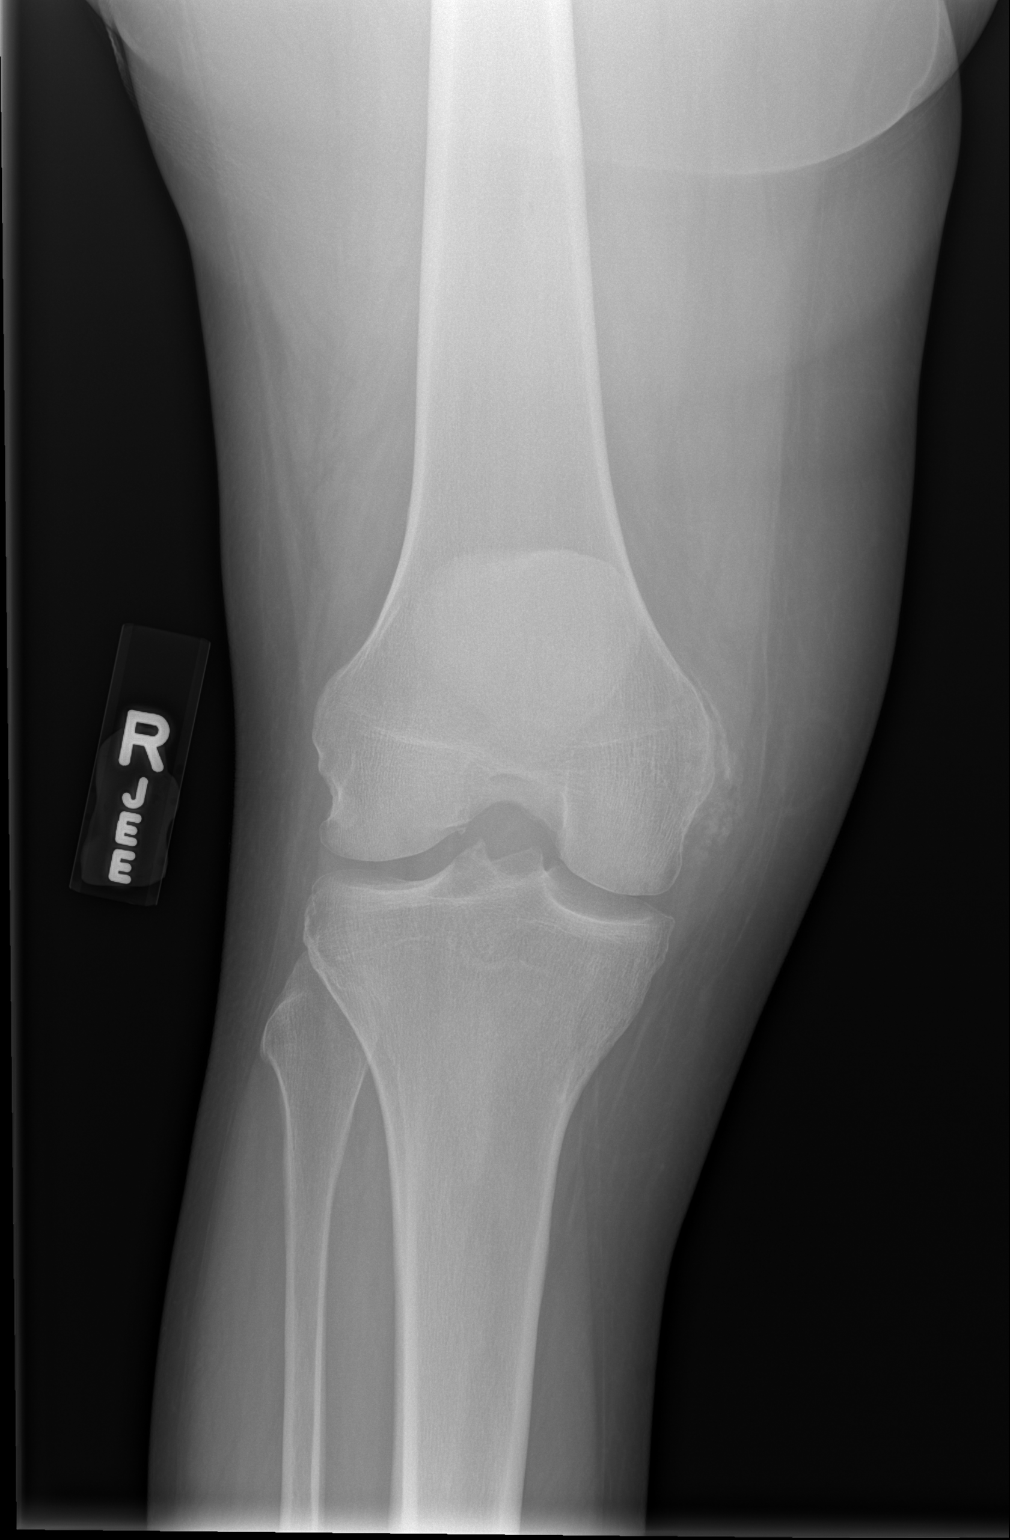

[x knee lat right]
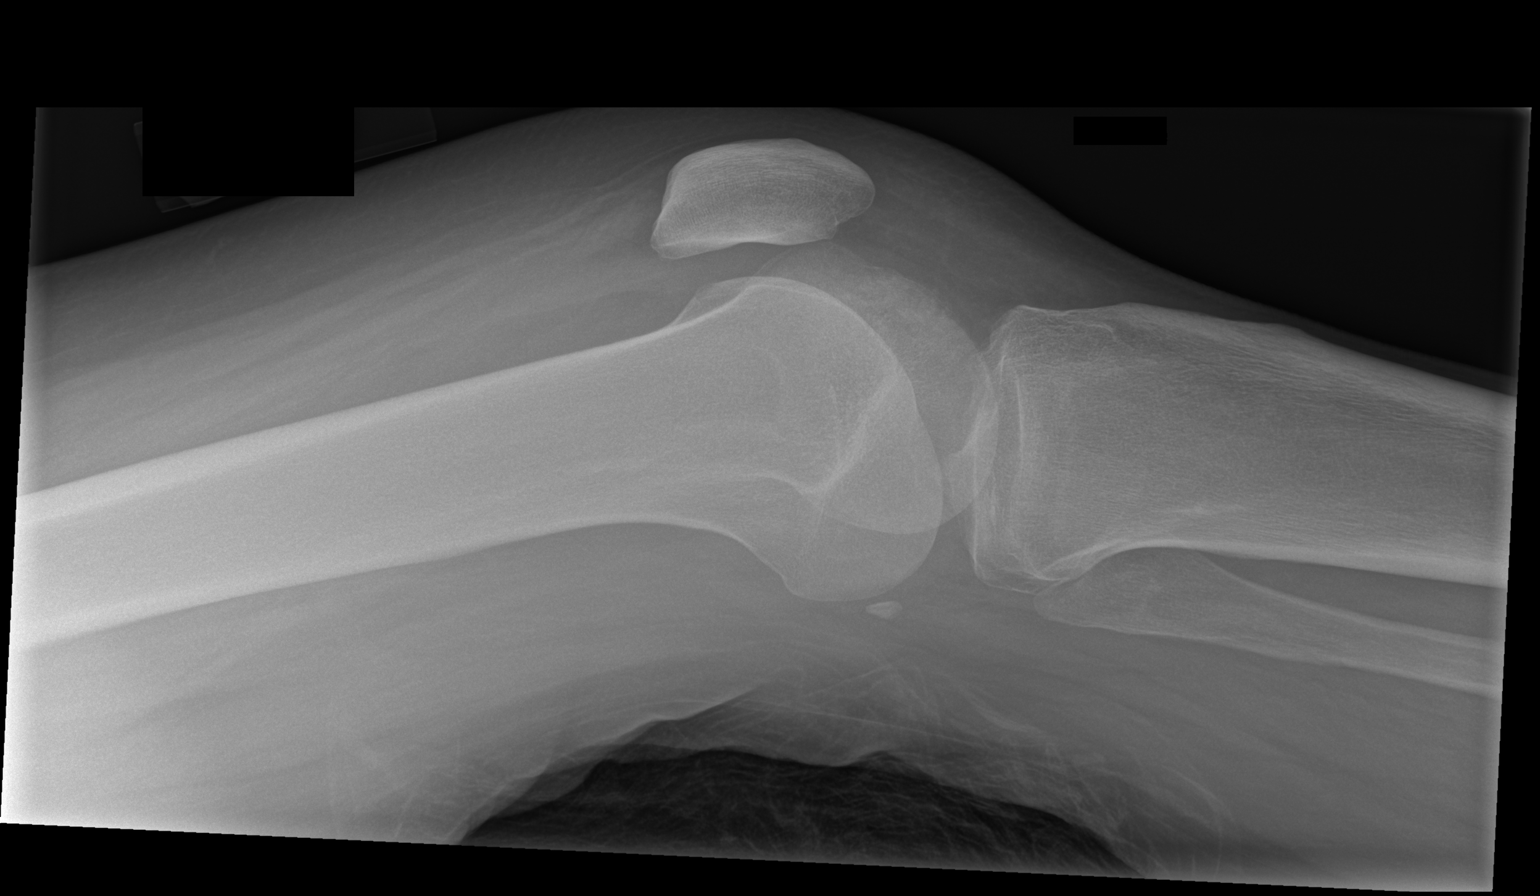

[2 of 2 positions shown; findings below may reference images not displayed]

FINDINGS: Osseous mineralization normal.
Joint spaces preserved.
Minimal knee joint effusion.
No dislocation or bone destruction.
Curvilinear calcific density is seen adjacent to the medial margin
of the medial femoral condyle with associated irregular soft tissue
calcification at expected position of the proximal medial
collateral ligament compatible with a Pelligrini Stieda lesion and
prior medial collateral ligament injury.
IMPRESSION: Pelligrini Stieda lesion of the medial femoral condyle consistent
with a preceding MCL injury.

## 2012-09-20 ENCOUNTER — Encounter (INDEPENDENT_AMBULATORY_CARE_PROVIDER_SITE_OTHER): Payer: Self-pay | Admitting: Surgery

## 2012-09-20 ENCOUNTER — Ambulatory Visit (INDEPENDENT_AMBULATORY_CARE_PROVIDER_SITE_OTHER): Payer: BC Managed Care – PPO | Admitting: Surgery

## 2012-09-20 VITALS — BP 110/72 | HR 74 | Temp 98.4°F | Resp 14 | Ht 73.0 in | Wt 236.4 lb

## 2012-09-20 DIAGNOSIS — N63 Unspecified lump in unspecified breast: Secondary | ICD-10-CM

## 2012-09-20 DIAGNOSIS — N6320 Unspecified lump in the left breast, unspecified quadrant: Secondary | ICD-10-CM
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
## 2012-09-20 NOTE — Patient Instructions (Signed)
Return as needed

## 2012-10-17 ENCOUNTER — Telehealth: Payer: Self-pay

## 2012-10-17 DIAGNOSIS — F329 Major depressive disorder, single episode, unspecified: Secondary | ICD-10-CM

## 2012-10-17 MED ORDER — CITALOPRAM HYDROBROMIDE 40 MG PO TABS: 40.0000 mg | ORAL_TABLET | Freq: Every day | ORAL | Status: DC

## 2012-10-17 NOTE — Telephone Encounter (Signed)
Patient sister called requesting celexa refill.  Medication refilled.

## 2012-10-18 ENCOUNTER — Telehealth: Payer: Self-pay

## 2012-10-18 NOTE — Telephone Encounter (Signed)
Rita request celexa refill.  Advised it was sent to pharmacy.

## 2012-10-24 ENCOUNTER — Encounter
Payer: BC Managed Care – PPO | Attending: Physical Medicine & Rehabilitation | Admitting: Physical Medicine & Rehabilitation

## 2012-10-24 ENCOUNTER — Encounter: Payer: Self-pay | Admitting: Physical Medicine & Rehabilitation

## 2012-10-24 VITALS — BP 123/71 | HR 74 | Resp 16 | Ht 72.0 in | Wt 235.0 lb

## 2012-10-24 DIAGNOSIS — IMO0002 Reserved for concepts with insufficient information to code with codable children: Secondary | ICD-10-CM | POA: Insufficient documentation

## 2012-10-24 DIAGNOSIS — D62 Acute posthemorrhagic anemia: Secondary | ICD-10-CM | POA: Insufficient documentation

## 2012-10-24 DIAGNOSIS — S069X9S Unspecified intracranial injury with loss of consciousness of unspecified duration, sequela: Secondary | ICD-10-CM | POA: Insufficient documentation

## 2012-10-24 DIAGNOSIS — H9313 Tinnitus, bilateral: Secondary | ICD-10-CM

## 2012-10-24 DIAGNOSIS — G8110 Spastic hemiplegia affecting unspecified side: Secondary | ICD-10-CM | POA: Insufficient documentation

## 2012-10-24 DIAGNOSIS — M948X9 Other specified disorders of cartilage, unspecified sites: Secondary | ICD-10-CM | POA: Insufficient documentation

## 2012-10-24 DIAGNOSIS — F3289 Other specified depressive episodes: Secondary | ICD-10-CM | POA: Insufficient documentation

## 2012-10-24 DIAGNOSIS — Z5189 Encounter for other specified aftercare: Secondary | ICD-10-CM

## 2012-10-24 DIAGNOSIS — F329 Major depressive disorder, single episode, unspecified: Secondary | ICD-10-CM | POA: Insufficient documentation

## 2012-10-24 DIAGNOSIS — S069X0D Unspecified intracranial injury without loss of consciousness, subsequent encounter: Secondary | ICD-10-CM

## 2012-10-24 DIAGNOSIS — M25569 Pain in unspecified knee: Secondary | ICD-10-CM | POA: Insufficient documentation

## 2012-10-24 DIAGNOSIS — L723 Sebaceous cyst: Secondary | ICD-10-CM | POA: Insufficient documentation

## 2012-10-24 DIAGNOSIS — H5440 Blindness, one eye, unspecified eye: Secondary | ICD-10-CM | POA: Insufficient documentation

## 2012-10-24 DIAGNOSIS — S069XAS Unspecified intracranial injury with loss of consciousness status unknown, sequela: Secondary | ICD-10-CM | POA: Insufficient documentation

## 2012-10-24 DIAGNOSIS — H9319 Tinnitus, unspecified ear: Secondary | ICD-10-CM | POA: Insufficient documentation

## 2012-10-24 DIAGNOSIS — R51 Headache: Secondary | ICD-10-CM | POA: Insufficient documentation

## 2012-10-24 MED ORDER — TRAMADOL HCL 50 MG PO TABS: 50.0000 mg | ORAL_TABLET | Freq: Three times a day (TID) | ORAL | Status: DC | PRN
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
## 2012-10-24 NOTE — Patient Instructions (Addendum)
CALL ME WITH ANY PROBLEMS OR QUESTIONS (#[phone]).  HAVE A GOOD DAY Tinnitus Sounds you hear in your ears and coming from within the ear is called tinnitus. This can be a symptom of many ear disorders. It is often associated with hearing loss.  Tinnitus can be seen with:  Infections.  Ear blockages such as wax buildup.  Meniere's disease.  Ear damage.  Inherited.  Occupational causes. While irritating, it is not usually a threat to health. When the cause of the tinnitus is wax, infection in the middle ear, or foreign body it is easily treated. Hearing loss will usually be reversible.  TREATMENT  When treating the underlying cause does not get rid of tinnitus, it may be necessary to get rid of the unwanted sound by covering it up with more pleasant background noises. This may include music, the radio etc. There are tinnitus maskers which can be worn which produce background noise to cover up the tinnitus. Avoid all medications which tend to make tinnitus worse such as alcohol, caffeine, aspirin, and nicotine. There are many soothing background tapes such as rain, ocean, thunderstorms, etc. These soothing sounds help with sleeping or resting. Keep all follow-up appointments and referrals. This is important to identify the cause of the problem. It also helps avoid complications, impaired hearing, disability, or chronic pain. Document Released: 06/15/2005 Document Revised: 09/07/2011 Document Reviewed: 02/01/2008 [facility] Patient Information 2013 [place], [place].

## 2012-12-08 ENCOUNTER — Telehealth: Payer: Self-pay

## 2012-12-08 DIAGNOSIS — S069X0D Unspecified intracranial injury without loss of consciousness, subsequent encounter: Secondary | ICD-10-CM

## 2012-12-08 MED ORDER — TRAMADOL HCL 50 MG PO TABS: 50.0000 mg | ORAL_TABLET | Freq: Three times a day (TID) | ORAL | Status: DC | PRN

## 2012-12-08 NOTE — Telephone Encounter (Signed)
Patient caregiver called requesting tramadol refill.  This was done and family aware.

## 2012-12-21 ENCOUNTER — Other Ambulatory Visit (INDEPENDENT_AMBULATORY_CARE_PROVIDER_SITE_OTHER): Payer: BC Managed Care – PPO

## 2012-12-21 ENCOUNTER — Ambulatory Visit (INDEPENDENT_AMBULATORY_CARE_PROVIDER_SITE_OTHER): Payer: BC Managed Care – PPO | Admitting: Internal Medicine

## 2012-12-21 ENCOUNTER — Encounter: Payer: Self-pay | Admitting: Internal Medicine

## 2012-12-21 VITALS — BP 118/78 | HR 68 | Temp 97.7°F | Resp 12 | Ht 73.0 in | Wt 229.0 lb

## 2012-12-21 DIAGNOSIS — R7309 Other abnormal glucose: Secondary | ICD-10-CM

## 2012-12-21 DIAGNOSIS — R739 Hyperglycemia, unspecified: Secondary | ICD-10-CM | POA: Insufficient documentation

## 2012-12-21 DIAGNOSIS — Z0000 Encounter for general adult medical examination without abnormal findings: Secondary | ICD-10-CM

## 2012-12-21 DIAGNOSIS — Z23 Encounter for immunization: Secondary | ICD-10-CM

## 2012-12-21 LAB — COMPREHENSIVE METABOLIC PANEL
ALT: 21 U/L (ref 0–53)
AST: 13 U/L (ref 0–37)
CO2: 26 mEq/L (ref 19–32)
Chloride: 107 mEq/L (ref 96–112)
GFR: 115.39 mL/min (ref >60.00)
Sodium: 140 mEq/L (ref 135–145)
Total Bilirubin: 0.6 mg/dL (ref 0.3–1.2)
Total Protein: 7.5 g/dL (ref 6.0–8.3)

## 2012-12-21 LAB — CBC WITH DIFFERENTIAL/PLATELET
Basophils Absolute: 0 10*3/uL (ref 0.0–0.1)
Eosinophils Absolute: 0.1 10*3/uL (ref 0.0–0.7)
HCT: 44.8 % (ref 39.0–52.0)
Lymphs Abs: 2 10*3/uL (ref 0.7–4.0)
MCHC: 32.7 g/dL (ref 30.0–36.0)
Monocytes Absolute: 0.2 10*3/uL (ref 0.1–1.0)
Monocytes Relative: 4.9 % (ref 3.0–12.0)
Neutro Abs: 2.1 10*3/uL (ref 1.4–7.7)
Platelets: 152 10*3/uL (ref 150.0–400.0)
RDW: 14.2 % (ref 11.5–14.6)

## 2012-12-21 LAB — LIPID PANEL
HDL: 36.1 mg/dL — ABNORMAL LOW (ref >39.00)
LDL Cholesterol: 123 mg/dL — ABNORMAL HIGH (ref 0–99)
Total CHOL/HDL Ratio: 5
Triglycerides: 54 mg/dL (ref 0.0–149.0)

## 2012-12-21 LAB — URINALYSIS, ROUTINE W REFLEX MICROSCOPIC
Bilirubin Urine: NEGATIVE
Hgb urine dipstick: NEGATIVE
Total Protein, Urine: NEGATIVE
Urine Glucose: NEGATIVE

## 2012-12-21 NOTE — Patient Instructions (Signed)
Health Maintenance, Males A healthy lifestyle and preventative care can promote health and wellness.  Maintain regular health, dental, and eye exams.  Eat a healthy diet. Foods like vegetables, fruits, whole grains, low-fat dairy products, and lean protein foods contain the nutrients you need without too many calories. Decrease your intake of foods high in solid fats, added sugars, and salt. Get information about a proper diet from your caregiver, if necessary.  Regular physical exercise is one of the most important things you can do for your health. Most adults should get at least 150 minutes of moderate-intensity exercise (any activity that increases your heart rate and causes you to sweat) each week. In addition, most adults need muscle-strengthening exercises on 2 or more days a week.   Maintain a healthy weight. The body mass index (BMI) is a screening tool to identify possible weight problems. It provides an estimate of body fat based on height and weight. Your caregiver can help determine your BMI, and can help you achieve or maintain a healthy weight. For adults 20 years and older:  A BMI below 18.5 is considered underweight.  A BMI of 18.5 to 24.9 is normal.  A BMI of 25 to 29.9 is considered overweight.  A BMI of 30 and above is considered obese.  Maintain normal blood lipids and cholesterol by exercising and minimizing your intake of saturated fat. Eat a balanced diet with plenty of fruits and vegetables. Blood tests for lipids and cholesterol should begin at age 20 and be repeated every 5 years. If your lipid or cholesterol levels are high, you are over 50, or you are a high risk for heart disease, you may need your cholesterol levels checked more frequently.Ongoing high lipid and cholesterol levels should be treated with medicines, if diet and exercise are not effective.  If you smoke, find out from your caregiver how to quit. If you do not use tobacco, do not start.  If you  choose to drink alcohol, do not exceed 2 drinks per day. One drink is considered to be 12 ounces (355 mL) of beer, 5 ounces (148 mL) of wine, or 1.5 ounces (44 mL) of liquor.  Avoid use of street drugs. Do not share needles with anyone. Ask for help if you need support or instructions about stopping the use of drugs.  High blood pressure causes heart disease and increases the risk of stroke. Blood pressure should be checked at least every 1 to 2 years. Ongoing high blood pressure should be treated with medicines if weight loss and exercise are not effective.  If you are 45 to 46 years old, ask your caregiver if you should take aspirin to prevent heart disease.  Diabetes screening involves taking a blood sample to check your fasting blood sugar level. This should be done once every 3 years, after age 45, if you are within normal weight and without risk factors for diabetes. Testing should be considered at a younger age or be carried out more frequently if you are overweight and have at least 1 risk factor for diabetes.  Colorectal cancer can be detected and often prevented. Most routine colorectal cancer screening begins at the age of 50 and continues through age 75. However, your caregiver may recommend screening at an earlier age if you have risk factors for colon cancer. On a yearly basis, your caregiver may provide home test kits to check for hidden blood in the stool. Use of a small camera at the end of a tube,   to directly examine the colon (sigmoidoscopy or colonoscopy), can detect the earliest forms of colorectal cancer. Talk to your caregiver about this at age 50, when routine screening begins. Direct examination of the colon should be repeated every 5 to 10 years through age 75, unless early forms of pre-cancerous polyps or small growths are found.  Hepatitis C blood testing is recommended for all people born from 1945 through 1965 and any individual with known risks for hepatitis C.  Healthy  men should no longer receive prostate-specific antigen (PSA) blood tests as part of routine cancer screening. Consult with your caregiver about prostate cancer screening.  Testicular cancer screening is not recommended for adolescents or adult males who have no symptoms. Screening includes self-exam, caregiver exam, and other screening tests. Consult with your caregiver about any symptoms you have or any concerns you have about testicular cancer.  Practice safe sex. Use condoms and avoid high-risk sexual practices to reduce the spread of sexually transmitted infections (STIs).  Use sunscreen with a sun protection factor (SPF) of 30 or greater. Apply sunscreen liberally and repeatedly throughout the day. You should seek shade when your shadow is shorter than you. Protect yourself by wearing long sleeves, pants, a wide-brimmed hat, and sunglasses year round, whenever you are outdoors.  Notify your caregiver of new moles or changes in moles, especially if there is a change in shape or color. Also notify your caregiver if a mole is larger than the size of a pencil eraser.  A one-time screening for abdominal aortic aneurysm (AAA) and surgical repair of large AAAs by sound wave imaging (ultrasonography) is recommended for ages 65 to 75 years who are current or former smokers.  Stay current with your immunizations. Document Released: 12/12/2007 Document Revised: 09/07/2011 Document Reviewed: 11/10/2010 [employer] Patient Information 2014 [employer].  

## 2012-12-21 NOTE — Progress Notes (Signed)
Subjective:    Patient ID: Mark [name], male    DOB: 12/04/1965, 47 y.o.   MRN: [id number]  HPI  New to me for a physical, he offers no complaints today.  Review of Systems  Constitutional: Negative.  Negative for fever, chills, diaphoresis, activity change, appetite change, fatigue and unexpected weight change.  HENT: Negative.   Eyes: Negative.   Respiratory: Negative for cough, chest tightness, shortness of breath, wheezing and stridor.   Cardiovascular: Negative.   Gastrointestinal: Negative.   Endocrine: Negative.  Negative for polyphagia and polyuria.  Genitourinary: Negative.   Musculoskeletal: Negative.   Allergic/Immunologic: Negative.   Neurological: Negative.  Negative for dizziness, tremors, weakness and light-headedness.  Hematological: Negative.   Psychiatric/Behavioral: Negative.   All other systems reviewed and are negative.       Objective:   Physical Exam  Vitals reviewed. Constitutional: He is oriented to person, place, and time. He appears well-developed and well-nourished. No distress.  HENT:  Head: Normocephalic and atraumatic.  Mouth/Throat: Oropharynx is clear and moist. No oropharyngeal exudate.  Eyes: Conjunctivae are normal. Right eye exhibits no discharge. Left eye exhibits no discharge. No scleral icterus.  Neck: Normal range of motion. Neck supple. No JVD present. No tracheal deviation present. No thyromegaly present.  Cardiovascular: Normal rate, regular rhythm, normal heart sounds and intact distal pulses.  Exam reveals no gallop and no friction rub.   No murmur heard. Pulmonary/Chest: Effort normal and breath sounds normal. No accessory muscle usage or stridor. Not tachypneic. No respiratory distress. He has no wheezes. He has no rales. Chest wall is not dull to percussion. He exhibits no mass, no tenderness and no bony tenderness. Right breast exhibits no mass, no skin change and no tenderness. Left breast exhibits no mass, no skin change and  no tenderness. Breasts are symmetrical.  Abdominal: Soft. Bowel sounds are normal. He exhibits no distension and no mass. There is no tenderness. There is no rebound and no guarding. Hernia confirmed negative in the right inguinal area and confirmed negative in the left inguinal area.  Genitourinary: Rectum normal, prostate normal, testes normal and penis normal. Rectal exam shows no external hemorrhoid, no internal hemorrhoid, no fissure, no mass, no tenderness and anal tone normal. Guaiac negative stool. Prostate is not enlarged and not tender. Right testis shows no mass, no swelling and no tenderness. Right testis is descended. Left testis shows no mass, no swelling and no tenderness. Left testis is descended. Circumcised. No penile erythema or penile tenderness. No discharge found.  Musculoskeletal: Normal range of motion. He exhibits no edema and no tenderness.  Lymphadenopathy:    He has no cervical adenopathy.       Right: No inguinal adenopathy present.       Left: No inguinal adenopathy present.  Neurological: He is oriented to person, place, and time.  Skin: Skin is warm and dry. No rash noted. He is not diaphoretic. No erythema. No pallor.  Psychiatric: He has a normal mood and affect. His behavior is normal. Judgment and thought content normal.     Lab Results  Component Value Date   WBC 4.5 11/17/2011   HGB 12.4* 11/17/2011   HCT 39.5 11/17/2011   PLT 157 11/17/2011   GLUCOSE 97 11/17/2011   TRIG 306* 10/07/2011   ALT 15 11/17/2011   AST 10 11/17/2011   NA 140 11/17/2011   K 3.8 11/17/2011   CL 105 11/17/2011   CREATININE 0.63 11/17/2011   BUN 13 11/17/2011  CO2 23 11/17/2011   INR 1.02 09/28/2011   HGBA1C 6.1* 10/08/2011       Assessment & Plan:

## 2012-12-23 NOTE — Assessment & Plan Note (Signed)
Exam done Vaccines were updated Labs done Pt ed material was given

## 2012-12-23 NOTE — Assessment & Plan Note (Signed)
I will check his A1C to see if he has developed DM2 

## 2013-02-22 ENCOUNTER — Encounter
Payer: BC Managed Care – PPO | Attending: Physical Medicine & Rehabilitation | Admitting: Physical Medicine & Rehabilitation

## 2013-02-22 ENCOUNTER — Encounter: Payer: Self-pay | Admitting: Physical Medicine & Rehabilitation

## 2013-02-22 VITALS — BP 130/79 | HR 67 | Resp 14 | Ht 73.0 in | Wt 236.6 lb

## 2013-02-22 DIAGNOSIS — D62 Acute posthemorrhagic anemia: Secondary | ICD-10-CM | POA: Insufficient documentation

## 2013-02-22 DIAGNOSIS — S069X0D Unspecified intracranial injury without loss of consciousness, subsequent encounter: Secondary | ICD-10-CM

## 2013-02-22 DIAGNOSIS — Z5181 Encounter for therapeutic drug level monitoring: Secondary | ICD-10-CM

## 2013-02-22 DIAGNOSIS — F329 Major depressive disorder, single episode, unspecified: Secondary | ICD-10-CM | POA: Insufficient documentation

## 2013-02-22 DIAGNOSIS — S069X9S Unspecified intracranial injury with loss of consciousness of unspecified duration, sequela: Secondary | ICD-10-CM | POA: Insufficient documentation

## 2013-02-22 DIAGNOSIS — S069X0S Unspecified intracranial injury without loss of consciousness, sequela: Secondary | ICD-10-CM

## 2013-02-22 DIAGNOSIS — F3289 Other specified depressive episodes: Secondary | ICD-10-CM | POA: Insufficient documentation

## 2013-02-22 DIAGNOSIS — IMO0002 Reserved for concepts with insufficient information to code with codable children: Secondary | ICD-10-CM | POA: Insufficient documentation

## 2013-02-22 DIAGNOSIS — S069XAS Unspecified intracranial injury with loss of consciousness status unknown, sequela: Secondary | ICD-10-CM | POA: Insufficient documentation

## 2013-02-22 DIAGNOSIS — H547 Unspecified visual loss: Secondary | ICD-10-CM | POA: Insufficient documentation

## 2013-02-22 DIAGNOSIS — Z79899 Other long term (current) drug therapy: Secondary | ICD-10-CM

## 2013-02-22 DIAGNOSIS — Z5189 Encounter for other specified aftercare: Secondary | ICD-10-CM

## 2013-02-22 DIAGNOSIS — R51 Headache: Secondary | ICD-10-CM | POA: Insufficient documentation

## 2013-02-22 DIAGNOSIS — M19019 Primary osteoarthritis, unspecified shoulder: Secondary | ICD-10-CM

## 2013-02-22 DIAGNOSIS — M25569 Pain in unspecified knee: Secondary | ICD-10-CM | POA: Insufficient documentation

## 2013-02-22 DIAGNOSIS — G8110 Spastic hemiplegia affecting unspecified side: Secondary | ICD-10-CM | POA: Insufficient documentation

## 2013-02-22 NOTE — Patient Instructions (Signed)
Acromioclavicular Injuries  The AC (acromioclavicular) joint is the joint in the shoulder where the collarbone (clavicle) meets the shoulder blade (scapula). The part of the shoulder blade connected to the collarbone is called the acromion. Common problems with and treatments for the AC joint are detailed below.  ARTHRITIS  Arthritis occurs when the joint has been injured and the smooth padding between the joints (cartilage) is lost. This is the wear and tear seen in most joints of the body if they have been overused. This causes the joint to produce pain and swelling which is worse with activity.   AC JOINT SEPARATION  AC joint separation means that the ligaments connecting the acromion of the shoulder blade and collarbone have been damaged, and the two bones no longer line up. AC separations can be anywhere from mild to severe, and are "graded" depending upon which ligaments are torn and how badly they are torn.   Grade I Injury: the least damage is done, and the AC joint still lines up.   Grade II Injury: damage to the ligaments which reinforce the AC joint. In a Grade II injury, these ligaments are stretched but not entirely torn. When stressed, the AC joint becomes painful and unstable.   Grade III Injury: AC and secondary ligaments are completely torn, and the collarbone is no longer attached to the shoulder blade. This results in deformity; a prominence of the end of the clavicle.  AC JOINT FRACTURE  AC joint fracture means that there has been a break in the bones of the AC joint, usually the end of the clavicle.  TREATMENT  TREATMENT OF AC ARTHRITIS   There is currently no way to replace the cartilage damaged by arthritis. The best way to improve the condition is to decrease the activities which aggravate the problem. Application of ice to the joint helps decrease pain and soreness (inflammation). The use of non-steroidal anti-inflammatory medication is helpful.   If less conservative measures do not  work, then cortisone shots (injections) may be used. These are anti-inflammatories; they decrease the soreness in the joint and swelling.   If non-surgical measures fail, surgery may be recommended. The procedure is generally removal of a portion of the end of the clavicle. This is the part of the collarbone closest to your acromion which is stabilized with ligaments to the acromion of the shoulder blade. This surgery may be performed using a tube-like instrument with a light (arthroscope) for looking into a joint. It may also be performed as an open surgery through a small incision by the surgeon. Most patients will have good range of motion within 6 weeks and may return to all activity including sports by 8-12 weeks, barring complications.  TREATMENT OF AN AC SEPARATION   The initial treatment is to decrease pain. This is best accomplished by immobilizing the arm in a sling and placing an ice pack to the shoulder for 20 to 30 minutes every 2 hours as needed. As the pain starts to subside, it is important to begin moving the fingers, wrist, elbow and eventually the shoulder in order to prevent a stiff or "frozen" shoulder. Instruction on when and how much to move the shoulder will be provided by your caregiver. The length of time needed to regain full motion and function depends on the amount or grade of the injury. Recovery from a Grade I AC separation usually takes 10 to 14 days, whereas a Grade III may take 6 to 8 weeks.   Grade   I and II separations usually do not require surgery. Even Grade III injuries usually allow return to full activity with few restrictions. Treatment is also based on the activity demands of the injured shoulder. For example, a high level quarterback with an injured throwing arm will receive more aggressive treatment than someone with a desk job who rarely uses his/her arm for strenuous activities. In some cases, a painful lump may persist which could require a later surgery. Surgery  can be very successful, but the benefits must be weighed against the potential risks.  TREATMENT OF AN AC JOINT FRACTURE  Fracture treatment depends on the type of fracture. Sometimes a splint or sling may be all that is required. Other times surgery may be required for repair. This is more frequently the case when the ligaments supporting the clavicle are completely torn. Your caregiver will help you with these decisions and together you can decide what will be the best treatment.  HOME CARE INSTRUCTIONS    Apply ice to the injury for 15-20 minutes each hour while awake for 2 days. Put the ice in a plastic bag and place a towel between the bag of ice and skin.   If a sling has been applied, wear it constantly for as long as directed by your caregiver, even at night. The sling or splint can be removed for bathing or showering or as directed. Be sure to keep the shoulder in the same place as when the sling is on. Do not lift the arm.   If a figure-of-eight splint has been applied it should be tightened gently by another person every day. Tighten it enough to keep the shoulders held back. Allow enough room to place the index finger between the body and strap. Loosen the splint immediately if there is numbness or tingling in the hands.   Take over-the-counter or prescription medicines for pain, discomfort or fever as directed by your caregiver.   If you or your child has received a follow up appointment, it is very important to keep that appointment in order to avoid long term complications, chronic pain or disability.  SEEK MEDICAL CARE IF:    The pain is not relieved with medications.   There is increased swelling or discoloration that continues to get worse rather than better.   You or your child has been unable to follow up as instructed.   There is progressive numbness and tingling in the arm, forearm or hand.  SEEK IMMEDIATE MEDICAL CARE IF:    The arm is numb, cold or pale.   There is increasing pain  in the hand, forearm or fingers.  MAKE SURE YOU:    Understand these instructions.   Will watch your condition.   Will get help right away if you are not doing well or get worse.  Document Released: 03/25/2005 Document Revised: 09/07/2011 Document Reviewed: 09/17/2008  [employer] Patient Information 2014 [employer].
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
## 2013-03-27 ENCOUNTER — Telehealth: Payer: Self-pay

## 2013-03-27 NOTE — Telephone Encounter (Signed)
Patient's mother called in and stated that the patient has clear nasal drainage x 1 week. Advised her to contact PCP.

## 2013-03-29 ENCOUNTER — Ambulatory Visit (INDEPENDENT_AMBULATORY_CARE_PROVIDER_SITE_OTHER): Payer: BC Managed Care – PPO | Admitting: Internal Medicine

## 2013-03-29 ENCOUNTER — Encounter: Payer: Self-pay | Admitting: Internal Medicine

## 2013-03-29 VITALS — BP 118/80 | HR 72 | Temp 98.0°F | Resp 16 | Ht 73.0 in | Wt 241.0 lb

## 2013-03-29 DIAGNOSIS — J309 Allergic rhinitis, unspecified: Secondary | ICD-10-CM

## 2013-03-29 MED ORDER — AZELASTINE-FLUTICASONE 137-50 MCG/ACT NA SUSP: 1.0000 | Freq: Two times a day (BID) | NASAL | Status: DC

## 2013-03-29 NOTE — Assessment & Plan Note (Signed)
Will treat with dymista 

## 2013-03-29 NOTE — Progress Notes (Signed)
Subjective:    Patient ID: Mark [name], male    DOB: Oct 13, 1965, 47 y.o.   MRN: [id number]  HPI  He returns c/o a one week history of runny nose (clear nasal phlegm), sneezing, and nasal congestion.  Review of Systems  Constitutional: Negative.  Negative for fever, chills, diaphoresis, appetite change and fatigue.  HENT: Positive for congestion, rhinorrhea, sneezing and postnasal drip. Negative for nosebleeds, sore throat, facial swelling, trouble swallowing, dental problem, voice change, sinus pressure, tinnitus and ear discharge.   Eyes: Negative.   Respiratory: Negative.  Negative for cough, choking, chest tightness, shortness of breath, wheezing and stridor.   Cardiovascular: Negative.  Negative for chest pain, palpitations and leg swelling.  Gastrointestinal: Negative.   Endocrine: Negative.   Genitourinary: Negative.   Musculoskeletal: Negative.   Skin: Negative.   Allergic/Immunologic: Negative.   Neurological: Negative.   Hematological: Negative.  Negative for adenopathy. Does not bruise/bleed easily.  Psychiatric/Behavioral: Negative.        Objective:   Physical Exam  Vitals reviewed. Constitutional: He is oriented to person, place, and time. He appears well-developed and well-nourished.  Non-toxic appearance. He does not have a sickly appearance. He does not appear ill. No distress.  HENT:  Right Ear: Hearing, tympanic membrane, external ear and ear canal normal.  Left Ear: Hearing, tympanic membrane, external ear and ear canal normal.  Nose: Mucosal edema and rhinorrhea present. No nose lacerations, sinus tenderness, nasal deformity, septal deviation or nasal septal hematoma. No epistaxis.  No foreign bodies. Right sinus exhibits no maxillary sinus tenderness and no frontal sinus tenderness. Left sinus exhibits no maxillary sinus tenderness and no frontal sinus tenderness.  Mouth/Throat: Mucous membranes are normal. Mucous membranes are not pale, not dry and not  cyanotic. No oral lesions. No trismus in the jaw. No edematous. No oropharyngeal exudate, posterior oropharyngeal edema, posterior oropharyngeal erythema or tonsillar abscesses.  Eyes: Conjunctivae are normal. Right eye exhibits no discharge. Left eye exhibits no discharge. No scleral icterus.  Neck: Normal range of motion. Neck supple. No JVD present. No tracheal deviation present. No thyromegaly present.  Cardiovascular: Normal rate, regular rhythm, normal heart sounds and intact distal pulses.  Exam reveals no gallop and no friction rub.   No murmur heard. Pulmonary/Chest: Effort normal and breath sounds normal. No stridor. No respiratory distress. He has no wheezes. He has no rales. He exhibits no tenderness.  Abdominal: Soft. Bowel sounds are normal. He exhibits no distension and no mass. There is no tenderness. There is no rebound and no guarding.  Musculoskeletal: Normal range of motion. He exhibits no edema and no tenderness.  Lymphadenopathy:    He has no cervical adenopathy.  Neurological: He is oriented to person, place, and time.  Skin: Skin is warm and dry. No rash noted. He is not diaphoretic. No erythema. No pallor.  Psychiatric: He has a normal mood and affect. His behavior is normal. Judgment and thought content normal.     Lab Results  Component Value Date   WBC 4.4* 12/21/2012   HGB 14.6 12/21/2012   HCT 44.8 12/21/2012   PLT 152.0 12/21/2012   GLUCOSE 97 12/21/2012   CHOL 170 12/21/2012   TRIG 54.0 12/21/2012   HDL 36.10* 12/21/2012   LDLCALC 123* 12/21/2012   ALT 21 12/21/2012   AST 13 12/21/2012   NA 140 12/21/2012   K 4.5 12/21/2012   CL 107 12/21/2012   CREATININE 0.9 12/21/2012   BUN 16 12/21/2012   CO2 26 12/21/2012  TSH 1.18 12/21/2012   PSA 0.73 12/21/2012   INR 1.02 09/28/2011   HGBA1C 5.6 12/21/2012       Assessment & Plan:

## 2013-03-29 NOTE — Patient Instructions (Signed)
Allergic Rhinitis Allergic rhinitis is when the mucous membranes in the nose respond to allergens. Allergens are particles in the air that cause your body to have an allergic reaction. This causes you to release allergic antibodies. Through a chain of events, these eventually cause you to release histamine into the blood stream (hence the use of antihistamines). Although meant to be protective to the body, it is this release that causes your discomfort, such as frequent sneezing, congestion and an itchy runny nose.  CAUSES  The pollen allergens may come from grasses, trees, and weeds. This is seasonal allergic rhinitis, or "hay fever." Other allergens cause year-round allergic rhinitis (perennial allergic rhinitis) such as house dust mite allergen, pet dander and mold spores.  SYMPTOMS   Nasal stuffiness (congestion).  Runny, itchy nose with sneezing and tearing of the eyes.  There is often an itching of the mouth, eyes and ears. It cannot be cured, but it can be controlled with medications. DIAGNOSIS  If you are unable to determine the offending allergen, skin or blood testing may find it. TREATMENT   Avoid the allergen.  Medications and allergy shots (immunotherapy) can help.  Hay fever may often be treated with antihistamines in pill or nasal spray forms. Antihistamines block the effects of histamine. There are over-the-counter medicines that may help with nasal congestion and swelling around the eyes. Check with your caregiver before taking or giving this medicine. If the treatment above does not work, there are many new medications your caregiver can prescribe. Stronger medications may be used if initial measures are ineffective. Desensitizing injections can be used if medications and avoidance fails. Desensitization is when a patient is given ongoing shots until the body becomes less sensitive to the allergen. Make sure you follow up with your caregiver if problems continue. SEEK MEDICAL  CARE IF:   You develop fever (more than 100.5 F (38.1 C).  You develop a cough that does not stop easily (persistent).  You have shortness of breath.  You start wheezing.  Symptoms interfere with normal daily activities. Document Released: 03/10/2001 Document Revised: 09/07/2011 Document Reviewed: 09/19/2008 Exit[employer] Patient Information 2014 [employer].  

## 2013-04-21 ENCOUNTER — Encounter: Payer: Self-pay | Admitting: Internal Medicine

## 2013-04-21 ENCOUNTER — Ambulatory Visit (INDEPENDENT_AMBULATORY_CARE_PROVIDER_SITE_OTHER): Payer: BC Managed Care – PPO | Admitting: Internal Medicine

## 2013-04-21 VITALS — BP 114/78 | HR 85 | Temp 98.5°F | Resp 16 | Ht 75.0 in | Wt 243.4 lb

## 2013-04-21 DIAGNOSIS — J309 Allergic rhinitis, unspecified: Secondary | ICD-10-CM

## 2013-04-21 NOTE — Assessment & Plan Note (Signed)
Will cont dymista

## 2013-04-21 NOTE — Patient Instructions (Signed)
Allergic Rhinitis Allergic rhinitis is when the mucous membranes in the nose respond to allergens. Allergens are particles in the air that cause your body to have an allergic reaction. This causes you to release allergic antibodies. Through a chain of events, these eventually cause you to release histamine into the blood stream (hence the use of antihistamines). Although meant to be protective to the body, it is this release that causes your discomfort, such as frequent sneezing, congestion and an itchy runny nose.  CAUSES  The pollen allergens may come from grasses, trees, and weeds. This is seasonal allergic rhinitis, or "hay fever." Other allergens cause year-round allergic rhinitis (perennial allergic rhinitis) such as house dust mite allergen, pet dander and mold spores.  SYMPTOMS   Nasal stuffiness (congestion).  Runny, itchy nose with sneezing and tearing of the eyes.  There is often an itching of the mouth, eyes and ears. It cannot be cured, but it can be controlled with medications. DIAGNOSIS  If you are unable to determine the offending allergen, skin or blood testing may find it. TREATMENT   Avoid the allergen.  Medications and allergy shots (immunotherapy) can help.  Hay fever may often be treated with antihistamines in pill or nasal spray forms. Antihistamines block the effects of histamine. There are over-the-counter medicines that may help with nasal congestion and swelling around the eyes. Check with your caregiver before taking or giving this medicine. If the treatment above does not work, there are many new medications your caregiver can prescribe. Stronger medications may be used if initial measures are ineffective. Desensitizing injections can be used if medications and avoidance fails. Desensitization is when a patient is given ongoing shots until the body becomes less sensitive to the allergen. Make sure you follow up with your caregiver if problems continue. SEEK MEDICAL  CARE IF:   You develop fever (more than 100.5 F (38.1 C).  You develop a cough that does not stop easily (persistent).  You have shortness of breath.  You start wheezing.  Symptoms interfere with normal daily activities. Document Released: 03/10/2001 Document Revised: 09/07/2011 Document Reviewed: 09/19/2008 Exit[employer] Patient Information 2014 [employer].  

## 2013-04-21 NOTE — Progress Notes (Signed)
  Subjective:    Patient ID: Mark [name], male    DOB: December 20, 1965, 47 y.o.   MRN: [id number]  HPI Comments: He returns for a f/up on allergic rhinitis, he tells me that dymista has helped a lot.     Review of Systems  Constitutional: Negative.  Negative for fever, chills, diaphoresis, appetite change and fatigue.  HENT: Negative.  Negative for congestion, nosebleeds, postnasal drip, rhinorrhea, sinus pressure, sneezing, sore throat and voice change.   Eyes: Negative.   Respiratory: Negative.   Cardiovascular: Negative.   Gastrointestinal: Negative.  Negative for nausea, vomiting, abdominal pain, diarrhea and constipation.  Endocrine: Negative.   Genitourinary: Negative.   Musculoskeletal: Negative.   Skin: Negative.   Allergic/Immunologic: Negative.   Neurological: Negative.   Hematological: Negative.  Negative for adenopathy. Does not bruise/bleed easily.  Psychiatric/Behavioral: Negative.        Objective:   Physical Exam  Vitals reviewed. Constitutional: He is oriented to person, place, and time. He appears well-developed and well-nourished. No distress.  HENT:  Head: Normocephalic and atraumatic.  Right Ear: Hearing, tympanic membrane, external ear and ear canal normal.  Left Ear: Hearing, tympanic membrane, external ear and ear canal normal.  Nose: Nose normal. No mucosal edema, rhinorrhea or sinus tenderness. No epistaxis.  No foreign bodies. Right sinus exhibits no maxillary sinus tenderness and no frontal sinus tenderness. Left sinus exhibits no maxillary sinus tenderness and no frontal sinus tenderness.  Mouth/Throat: Oropharynx is clear and moist and mucous membranes are normal. Mucous membranes are not pale, not dry and not cyanotic. No oral lesions. No trismus in the jaw. No uvula swelling. No oropharyngeal exudate, posterior oropharyngeal edema, posterior oropharyngeal erythema or tonsillar abscesses.  Eyes: Conjunctivae are normal. Right eye exhibits no discharge.  Left eye exhibits no discharge. No scleral icterus.  Neck: Normal range of motion. Neck supple. No JVD present. No tracheal deviation present. No thyromegaly present.  Cardiovascular: Normal rate, regular rhythm, normal heart sounds and intact distal pulses.  Exam reveals no gallop and no friction rub.   No murmur heard. Pulmonary/Chest: Effort normal and breath sounds normal. No stridor. No respiratory distress. He has no wheezes. He has no rales. He exhibits no tenderness.  Abdominal: Soft. Bowel sounds are normal. He exhibits no distension and no mass. There is no tenderness. There is no rebound and no guarding.  Musculoskeletal: Normal range of motion. He exhibits no edema and no tenderness.  Lymphadenopathy:    He has no cervical adenopathy.  Neurological: He is oriented to person, place, and time.  Skin: Skin is warm and dry. No rash noted. He is not diaphoretic. No erythema. No pallor.      Lab Results  Component Value Date   WBC 4.4* 12/21/2012   HGB 14.6 12/21/2012   HCT 44.8 12/21/2012   PLT 152.0 12/21/2012   GLUCOSE 97 12/21/2012   CHOL 170 12/21/2012   TRIG 54.0 12/21/2012   HDL 36.10* 12/21/2012   LDLCALC 123* 12/21/2012   ALT 21 12/21/2012   AST 13 12/21/2012   NA 140 12/21/2012   K 4.5 12/21/2012   CL 107 12/21/2012   CREATININE 0.9 12/21/2012   BUN 16 12/21/2012   CO2 26 12/21/2012   TSH 1.18 12/21/2012   PSA 0.73 12/21/2012   INR 1.02 09/28/2011   HGBA1C 5.6 12/21/2012      Assessment & Plan:

## 2013-05-01 ENCOUNTER — Telehealth: Payer: Self-pay

## 2013-05-01 DIAGNOSIS — S069X0D Unspecified intracranial injury without loss of consciousness, subsequent encounter: Secondary | ICD-10-CM

## 2013-05-01 NOTE — Telephone Encounter (Signed)
Swartz pt 

## 2013-05-01 NOTE — Telephone Encounter (Signed)
Mark [name] called in regards to patient, requesting tramadol refill.  Please advise

## 2013-05-02 MED ORDER — TRAMADOL HCL 50 MG PO TABS: 50.0000 mg | ORAL_TABLET | Freq: Three times a day (TID) | ORAL | Status: DC | PRN

## 2013-05-02 NOTE — Telephone Encounter (Signed)
Please refill  thanks

## 2013-05-02 NOTE — Telephone Encounter (Signed)
Called in Tramadol to [facility]. Left voicemail letting patient know that RX had been called in to pharmacy.

## 2013-05-09 ENCOUNTER — Telehealth: Payer: Self-pay

## 2013-05-09 DIAGNOSIS — F329 Major depressive disorder, single episode, unspecified: Secondary | ICD-10-CM

## 2013-05-09 MED ORDER — CITALOPRAM HYDROBROMIDE 40 MG PO TABS: 40.0000 mg | ORAL_TABLET | Freq: Every day | ORAL | Status: DC

## 2013-05-09 NOTE — Telephone Encounter (Signed)
Refilled citalopram as was Mark [name]'s request. Notified

## 2013-05-09 NOTE — Telephone Encounter (Signed)
 [facility] called regarding patients celexa refill.

## 2013-05-09 NOTE — Telephone Encounter (Signed)
Left VM to [facility]

## 2013-07-12 ENCOUNTER — Ambulatory Visit (INDEPENDENT_AMBULATORY_CARE_PROVIDER_SITE_OTHER): Payer: BC Managed Care – PPO | Admitting: Internal Medicine

## 2013-07-12 ENCOUNTER — Ambulatory Visit (INDEPENDENT_AMBULATORY_CARE_PROVIDER_SITE_OTHER)
Admission: RE | Admit: 2013-07-12 | Discharge: 2013-07-12 | Disposition: A | Discharge status: Home / Self Care | Payer: BC Managed Care – PPO | Source: Ambulatory Visit | Attending: Internal Medicine | Admitting: Internal Medicine

## 2013-07-12 ENCOUNTER — Telehealth: Payer: Self-pay | Admitting: Internal Medicine

## 2013-07-12 ENCOUNTER — Encounter: Payer: Self-pay | Admitting: Internal Medicine

## 2013-07-12 VITALS — BP 110/64 | HR 100 | Temp 101.2°F | Resp 16 | Ht 75.0 in | Wt 246.0 lb

## 2013-07-12 DIAGNOSIS — J189 Pneumonia, unspecified organism: Secondary | ICD-10-CM

## 2013-07-12 DIAGNOSIS — R059 Cough, unspecified: Secondary | ICD-10-CM

## 2013-07-12 DIAGNOSIS — R05 Cough: Secondary | ICD-10-CM

## 2013-07-12 DIAGNOSIS — J111 Influenza due to unidentified influenza virus with other respiratory manifestations: Secondary | ICD-10-CM | POA: Insufficient documentation

## 2013-07-12 MED ORDER — PROMETHAZINE-DM 6.25-15 MG/5ML PO SYRP: 5.0000 mL | ORAL_SOLUTION | Freq: Four times a day (QID) | ORAL | Status: DC | PRN

## 2013-07-12 MED ORDER — OSELTAMIVIR PHOSPHATE 75 MG PO CAPS
75.0000 mg | ORAL_CAPSULE | Freq: Two times a day (BID) | ORAL | Status: AC
Start: 2013-07-12 — End: 2013-07-17

## 2013-07-12 MED ORDER — MOXIFLOXACIN HCL 400 MG PO TABS: 400.0000 mg | ORAL_TABLET | Freq: Every day | ORAL | Status: DC

## 2013-07-12 MED ORDER — HYDROCODONE-HOMATROPINE 5-1.5 MG/5ML PO SYRP: 5.0000 mL | ORAL_SOLUTION | Freq: Three times a day (TID) | ORAL | Status: DC | PRN

## 2013-07-12 NOTE — Patient Instructions (Signed)

## 2013-07-12 NOTE — Progress Notes (Signed)
Pre-visit discussion using our clinic review tool. No additional management support is needed unless otherwise documented below in the visit note.  

## 2013-07-12 NOTE — Telephone Encounter (Signed)
Patient Information:  Caller Name: [name]  Phone: [phone]  Patient: Mark [name]  Gender: Male  DOB: [birth year]  Age: 48 Years  PCP: Scarlette Calico (Adults only)  Office Follow Up:  Does the office need to follow up with this patient?: No  Instructions For The Office: N/A   Symptoms  Reason For Call & Symptoms: Pt/Mom calling regarding flu like sx. Headache, fever, cold sx, body aches.  Reviewed Health History In EMR: N/A  Reviewed Medications In EMR: N/A  Reviewed Allergies In EMR: Yes  Reviewed Surgeries / Procedures: Yes  Date of Onset of Symptoms: 07/05/2013  Treatments Tried: OTC-Mucinex, Claritin.  Treatments Tried Worked: No  Any Fever: Yes  Fever Taken: Tactile  Fever Time Of Reading: 10:25:06  Fever Last Reading: N/A  Guideline(s) Used:  Influenza - Seasonal  Disposition Per Guideline:   See Today in Office  Reason For Disposition Reached:   Using nasal washes and pain medicine > 24 hours and sinus pain (lower forehead, cheekbone, or eye) persists  Advice Given:  N/A  Patient Will Follow Care Advice:  YES  Appointment Scheduled:  07/12/2013 11:30:00 Appointment Scheduled Provider:  Scarlette Calico (Adults only)

## 2013-07-12 NOTE — Progress Notes (Signed)
Subjective:    Patient ID: Mark [name], male    DOB: 01-18-1966, 48 y.o.   MRN: [id number]  Cough This is a recurrent problem. Episode onset: for 2 weeks. The problem has been gradually worsening. The problem occurs every few hours. The cough is productive of purulent sputum. Associated symptoms include chills, a fever, nasal congestion, postnasal drip, rhinorrhea, a sore throat and shortness of breath. Pertinent negatives include no chest pain, ear congestion, ear pain, headaches, heartburn, hemoptysis, myalgias, rash, sweats, weight loss or wheezing. Nothing aggravates the symptoms. He has tried OTC cough suppressant for the symptoms. The treatment provided no relief. There is no history of asthma, bronchitis, COPD or environmental allergies.      Review of Systems  Constitutional: Positive for fever, chills and fatigue. Negative for weight loss, diaphoresis, activity change, appetite change and unexpected weight change.  HENT: Positive for postnasal drip, rhinorrhea, sinus pressure and sore throat. Negative for ear pain, facial swelling, sneezing, tinnitus, trouble swallowing and voice change.   Eyes: Negative.   Respiratory: Positive for cough and shortness of breath. Negative for apnea, hemoptysis, choking, chest tightness, wheezing and stridor.   Cardiovascular: Negative.  Negative for chest pain, palpitations and leg swelling.  Gastrointestinal: Negative.  Negative for heartburn, nausea, vomiting, abdominal pain, diarrhea and constipation.  Endocrine: Negative.   Genitourinary: Negative.   Musculoskeletal: Negative.  Negative for myalgias.  Skin: Negative.  Negative for rash.  Allergic/Immunologic: Negative.  Negative for environmental allergies.  Neurological: Negative.  Negative for headaches.  Hematological: Negative.  Negative for adenopathy. Does not bruise/bleed easily.  Psychiatric/Behavioral: Negative.        Objective:   Physical Exam  Vitals  reviewed. Constitutional: He is oriented to person, place, and time. He appears well-developed and well-nourished.  Non-toxic appearance. He does not have a sickly appearance. He does not appear ill. No distress.  HENT:  Head: Normocephalic and atraumatic.  Right Ear: Hearing, tympanic membrane, external ear and ear canal normal.  Left Ear: Hearing, tympanic membrane, external ear and ear canal normal.  Nose: No mucosal edema or rhinorrhea. Right sinus exhibits maxillary sinus tenderness. Right sinus exhibits no frontal sinus tenderness. Left sinus exhibits maxillary sinus tenderness. Left sinus exhibits no frontal sinus tenderness.  Mouth/Throat: Mucous membranes are normal. Mucous membranes are not pale, not dry and not cyanotic. No oral lesions. No trismus in the jaw. No uvula swelling. Posterior oropharyngeal erythema present. No oropharyngeal exudate, posterior oropharyngeal edema or tonsillar abscesses.  Eyes: Conjunctivae are normal. Right eye exhibits no discharge. Left eye exhibits no discharge. No scleral icterus.  Neck: Normal range of motion. Neck supple. No JVD present. No tracheal deviation present. No thyromegaly present.  Cardiovascular: Normal rate, regular rhythm, normal heart sounds and intact distal pulses.  Exam reveals no gallop and no friction rub.   No murmur heard. Pulmonary/Chest: Effort normal and breath sounds normal. No stridor. No respiratory distress. He has no wheezes. He has no rales. He exhibits no tenderness.  Abdominal: Soft. Bowel sounds are normal. He exhibits no distension and no mass. There is no tenderness. There is no rebound and no guarding.  Musculoskeletal: Normal range of motion. He exhibits no edema and no tenderness.  Lymphadenopathy:    He has no cervical adenopathy.  Neurological: He is oriented to person, place, and time.  Skin: Skin is warm and dry. No rash noted. He is not diaphoretic. No erythema. No pallor.  Psychiatric: He has a normal mood  and affect. His  behavior is normal. Judgment and thought content normal.     Lab Results  Component Value Date   WBC 4.4* 12/21/2012   HGB 14.6 12/21/2012   HCT 44.8 12/21/2012   PLT 152.0 12/21/2012   GLUCOSE 97 12/21/2012   CHOL 170 12/21/2012   TRIG 54.0 12/21/2012   HDL 36.10* 12/21/2012   LDLCALC 123* 12/21/2012   ALT 21 12/21/2012   AST 13 12/21/2012   NA 140 12/21/2012   K 4.5 12/21/2012   CL 107 12/21/2012   CREATININE 0.9 12/21/2012   BUN 16 12/21/2012   CO2 26 12/21/2012   TSH 1.18 12/21/2012   PSA 0.73 12/21/2012   INR 1.02 09/28/2011   HGBA1C 5.6 12/21/2012       Assessment & Plan:

## 2013-07-13 NOTE — Assessment & Plan Note (Signed)
I will check his CXR to see if there is PNA, mass , edema 

## 2013-07-13 NOTE — Assessment & Plan Note (Signed)
I have asked him to start tamiflu

## 2013-07-13 NOTE — Assessment & Plan Note (Signed)
Clinically he appears to have PNA and sinusitis so I have asked him to start Avelox He will take phenergan-dm as needed for the cough

## 2013-07-14 ENCOUNTER — Telehealth: Payer: Self-pay | Admitting: *Deleted

## 2013-07-14 NOTE — Telephone Encounter (Signed)
Mother phoned to clarify/confirm safety of patient taking both Avelox and Tamiflu.  Please advise.   CB# [phone]

## 2013-07-14 NOTE — Telephone Encounter (Signed)
Mother phoned back in again, reiterating her concern regarding taking avelox and tamiflu simultaneously.  PCP out of office, consulted Dr. Asa Lente, confirmed okay to take both.  Relayed info to mother.

## 2013-07-24 ENCOUNTER — Ambulatory Visit (INDEPENDENT_AMBULATORY_CARE_PROVIDER_SITE_OTHER): Payer: BC Managed Care – PPO | Admitting: Internal Medicine

## 2013-07-24 ENCOUNTER — Telehealth: Payer: Self-pay | Admitting: Internal Medicine

## 2013-07-24 ENCOUNTER — Encounter: Payer: Self-pay | Admitting: Internal Medicine

## 2013-07-24 VITALS — BP 112/74 | HR 83 | Temp 99.5°F | Wt 241.8 lb

## 2013-07-24 DIAGNOSIS — S069XAA Unspecified intracranial injury with loss of consciousness status unknown, initial encounter: Secondary | ICD-10-CM

## 2013-07-24 DIAGNOSIS — S069X9A Unspecified intracranial injury with loss of consciousness of unspecified duration, initial encounter: Secondary | ICD-10-CM

## 2013-07-24 DIAGNOSIS — G960 Cerebrospinal fluid leak: Principal | ICD-10-CM

## 2013-07-24 DIAGNOSIS — G9601 Cranial cerebrospinal fluid leak, spontaneous: Secondary | ICD-10-CM

## 2013-07-24 NOTE — Progress Notes (Signed)
Pre visit review using our clinic review tool, if applicable. No additional management support is needed unless otherwise documented below in the visit note. 
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
## 2013-07-24 NOTE — Telephone Encounter (Signed)
Patient Information:  Caller Name: [name]  Phone: [phone]  Patient: Mark [name]  Gender: Male  DOB: [birth year]  Age: 48 Years  PCP: Scarlette Calico (Adults only)  Office Follow Up:  Does the office need to follow up with this patient?: No  Instructions For The Office: N/A  RN Note:  Unilateral clear rhinorrhea for > 3 months. No sneezing.  Eyes watery at times. Cough ended after treatment for influenza. Heater filter not changed regularly; last changed 07/24/13.  Symptoms  Reason For Call & Symptoms: Chronic clear rhinorhea from left nostril  Reviewed Health History In EMR: Yes  Reviewed Medications In EMR: Yes  Reviewed Allergies In EMR: Yes  Reviewed Surgeries / Procedures: Yes  Date of Onset of Symptoms: 03/29/2013  Treatments Tried: Dymista  Treatments Tried Worked: No  Guideline(s) Used:  Hay Fever - Nasal Allergies  Disposition Per Guideline:   See Today or Tomorrow in Office  Reason For Disposition Reached:   Nasal discharge present > 10 days  Advice Given:  Avoiding Pollen:  Use a high efficiency house air filter (HEPA or electrostatic)  Antihistamine Medications for Hay Fever:  Antihistamines help reduce sneezing, itching, and runny nose.  Loratadine is a newer (second generation) antihistamine. The dosage of loratadine (e.g., OTC Claritin, Alavert) is 10 mg once a day.  Cetirizine is a newer (second generation) antihistamine. The dosage of cetirizine (e.g., OTC Zyrtec) is 10 mg once a day.  Loratadine and cetirizine cause less sleepiness than diphenhydramine (Benadryl) or chlorpheniramine (Chlor-Trimeton, Chlor-Tripolon).  Call Back If:  Symptoms are not controlled in 2 days with continuous antihistamines  You become worse  Patient Will Follow Care Advice:  YES  Appointment Scheduled:  07/24/2013 16:15:00 Appointment Scheduled Provider:  Adella Hare (Adults only)

## 2013-07-25 ENCOUNTER — Telehealth: Payer: Self-pay

## 2013-07-25 NOTE — Telephone Encounter (Signed)
Phone call from [place] at [phone] states a CT was order on this patient for CSF leak. She says normally a myelogram is done before this. Is that okay with you? If so she needs an order placed.
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
## 2013-07-26 ENCOUNTER — Telehealth: Payer: Self-pay

## 2013-07-26 NOTE — Telephone Encounter (Signed)
Order was put in at the time of the visit. Rose from CT called asking if I wanted a myelogram and I sent back a message saying NO, I want the CT as ordered.  Please check to see about scheduling this. I will check my messages tomorrow PM to see if I need to do anything more.

## 2013-07-26 NOTE — Telephone Encounter (Signed)
I spoke to [facility] and advised of MD message

## 2013-07-26 NOTE — Telephone Encounter (Signed)
Phone call from patient's mother, [name], wanting to know why the CT was not scheduled at [facility] as discussed. She is requesting a phone call directly from you.

## 2013-07-27 NOTE — Telephone Encounter (Signed)
Hopefully you can help with this. Patient's mother states the CT is to be at [facility]

## 2013-08-01 ENCOUNTER — Encounter (HOSPITAL_COMMUNITY): Payer: Self-pay

## 2013-08-01 ENCOUNTER — Ambulatory Visit (HOSPITAL_COMMUNITY)
Admission: RE | Admit: 2013-08-01 | Discharge: 2013-08-01 | Disposition: A | Discharge status: Home / Self Care | Payer: BC Managed Care – PPO | Source: Ambulatory Visit | Attending: Internal Medicine | Admitting: Internal Medicine

## 2013-08-01 ENCOUNTER — Inpatient Hospital Stay: Admission: RE | Admit: 2013-08-01 | Payer: BC Managed Care – PPO | Source: Ambulatory Visit

## 2013-08-01 DIAGNOSIS — G9601 Cranial cerebrospinal fluid leak, spontaneous: Secondary | ICD-10-CM

## 2013-08-01 DIAGNOSIS — J3489 Other specified disorders of nose and nasal sinuses: Secondary | ICD-10-CM | POA: Insufficient documentation

## 2013-08-01 DIAGNOSIS — G960 Cerebrospinal fluid leak: Secondary | ICD-10-CM

## 2013-08-01 DIAGNOSIS — Z87820 Personal history of traumatic brain injury: Secondary | ICD-10-CM | POA: Insufficient documentation

## 2013-08-01 DIAGNOSIS — R51 Headache: Secondary | ICD-10-CM | POA: Insufficient documentation

## 2013-08-02 ENCOUNTER — Other Ambulatory Visit: Payer: Self-pay | Admitting: Internal Medicine

## 2013-08-02 DIAGNOSIS — J329 Chronic sinusitis, unspecified: Secondary | ICD-10-CM | POA: Insufficient documentation

## 2013-08-09 ENCOUNTER — Other Ambulatory Visit: Payer: Self-pay | Admitting: Internal Medicine

## 2013-08-09 ENCOUNTER — Ambulatory Visit (INDEPENDENT_AMBULATORY_CARE_PROVIDER_SITE_OTHER)
Admission: RE | Admit: 2013-08-09 | Discharge: 2013-08-09 | Disposition: A | Discharge status: Home / Self Care | Payer: BC Managed Care – PPO | Source: Ambulatory Visit | Attending: Internal Medicine | Admitting: Internal Medicine

## 2013-08-09 DIAGNOSIS — G9601 Cranial cerebrospinal fluid leak, spontaneous: Secondary | ICD-10-CM

## 2013-08-09 DIAGNOSIS — G960 Cerebrospinal fluid leak: Principal | ICD-10-CM

## 2013-08-09 DIAGNOSIS — J329 Chronic sinusitis, unspecified: Secondary | ICD-10-CM

## 2013-08-10 ENCOUNTER — Telehealth: Payer: Self-pay | Admitting: *Deleted

## 2013-08-10 NOTE — Telephone Encounter (Signed)
Mother phoning requesting CT results from yesterday.  Message was left on triage line yesterday at 1645.  Please advise.   CB# [phone]

## 2013-08-10 NOTE — Telephone Encounter (Signed)
Ct scan was abnormal - I have sent a referral for him to see neurosurgery again

## 2013-08-10 NOTE — Telephone Encounter (Signed)
Notified mother of MD response and she states she prefers Dr. Shanon Brow Jones-neurosurgeon patient has history with.

## 2013-08-23 ENCOUNTER — Encounter: Payer: Self-pay | Admitting: Physical Medicine & Rehabilitation

## 2013-08-23 ENCOUNTER — Encounter
Payer: BC Managed Care – PPO | Attending: Physical Medicine & Rehabilitation | Admitting: Physical Medicine & Rehabilitation

## 2013-08-23 VITALS — BP 119/69 | HR 75 | Resp 14 | Ht 72.0 in | Wt 244.0 lb

## 2013-08-23 DIAGNOSIS — G960 Cerebrospinal fluid leak: Secondary | ICD-10-CM

## 2013-08-23 DIAGNOSIS — H539 Unspecified visual disturbance: Secondary | ICD-10-CM | POA: Insufficient documentation

## 2013-08-23 DIAGNOSIS — S069X9A Unspecified intracranial injury with loss of consciousness of unspecified duration, initial encounter: Secondary | ICD-10-CM

## 2013-08-23 DIAGNOSIS — D62 Acute posthemorrhagic anemia: Secondary | ICD-10-CM | POA: Insufficient documentation

## 2013-08-23 DIAGNOSIS — G8110 Spastic hemiplegia affecting unspecified side: Secondary | ICD-10-CM | POA: Insufficient documentation

## 2013-08-23 DIAGNOSIS — S069XAA Unspecified intracranial injury with loss of consciousness status unknown, initial encounter: Secondary | ICD-10-CM

## 2013-08-23 DIAGNOSIS — J3489 Other specified disorders of nose and nasal sinuses: Secondary | ICD-10-CM | POA: Insufficient documentation

## 2013-08-23 DIAGNOSIS — X58XXXS Exposure to other specified factors, sequela: Secondary | ICD-10-CM | POA: Insufficient documentation

## 2013-08-23 DIAGNOSIS — M19019 Primary osteoarthritis, unspecified shoulder: Secondary | ICD-10-CM | POA: Insufficient documentation

## 2013-08-23 DIAGNOSIS — M9689 Other intraoperative and postprocedural complications and disorders of the musculoskeletal system: Secondary | ICD-10-CM

## 2013-08-23 DIAGNOSIS — F3289 Other specified depressive episodes: Secondary | ICD-10-CM | POA: Insufficient documentation

## 2013-08-23 DIAGNOSIS — S0292XS Unspecified fracture of facial bones, sequela: Secondary | ICD-10-CM

## 2013-08-23 DIAGNOSIS — G9601 Cranial cerebrospinal fluid leak, spontaneous: Secondary | ICD-10-CM

## 2013-08-23 DIAGNOSIS — S0291XS Unspecified fracture of skull, sequela: Secondary | ICD-10-CM | POA: Insufficient documentation

## 2013-08-23 DIAGNOSIS — F329 Major depressive disorder, single episode, unspecified: Secondary | ICD-10-CM | POA: Insufficient documentation

## 2013-08-23 DIAGNOSIS — M6140 Other calcification of muscle, unspecified site: Secondary | ICD-10-CM | POA: Insufficient documentation

## 2013-08-23 MED ORDER — METHYLPHENIDATE HCL 5 MG PO TABS: 5.0000 mg | ORAL_TABLET | Freq: Two times a day (BID) | ORAL | Status: DC

## 2013-08-23 MED ORDER — TRAMADOL HCL 50 MG PO TABS: 50.0000 mg | ORAL_TABLET | Freq: Three times a day (TID) | ORAL | Status: DC | PRN

## 2013-08-23 NOTE — Patient Instructions (Signed)
PLEASE CALL ME WITH ANY PROBLEMS OR QUESTIONS (#[phone]).      
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
## 2013-10-18 ENCOUNTER — Encounter
Payer: BC Managed Care – PPO | Attending: Physical Medicine & Rehabilitation | Admitting: Physical Medicine & Rehabilitation

## 2013-10-18 ENCOUNTER — Encounter: Payer: Self-pay | Admitting: Physical Medicine & Rehabilitation

## 2013-10-18 VITALS — BP 122/68 | HR 69 | Resp 14 | Ht 72.0 in | Wt 249.6 lb

## 2013-10-18 DIAGNOSIS — X58XXXS Exposure to other specified factors, sequela: Secondary | ICD-10-CM | POA: Insufficient documentation

## 2013-10-18 DIAGNOSIS — G9601 Cranial cerebrospinal fluid leak, spontaneous: Secondary | ICD-10-CM

## 2013-10-18 DIAGNOSIS — G960 Cerebrospinal fluid leak: Secondary | ICD-10-CM

## 2013-10-18 DIAGNOSIS — M898X9 Other specified disorders of bone, unspecified site: Secondary | ICD-10-CM

## 2013-10-18 DIAGNOSIS — S069XAA Unspecified intracranial injury with loss of consciousness status unknown, initial encounter: Secondary | ICD-10-CM

## 2013-10-18 DIAGNOSIS — S069X9A Unspecified intracranial injury with loss of consciousness of unspecified duration, initial encounter: Secondary | ICD-10-CM | POA: Insufficient documentation

## 2013-10-18 DIAGNOSIS — M948X9 Other specified disorders of cartilage, unspecified sites: Secondary | ICD-10-CM

## 2013-10-18 MED ORDER — METHYLPHENIDATE HCL 5 MG PO TABS: 5.0000 mg | ORAL_TABLET | Freq: Two times a day (BID) | ORAL | Status: DC
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
## 2013-10-18 NOTE — Patient Instructions (Signed)
PLEASE CALL ME WITH ANY PROBLEMS OR QUESTIONS (#[phone]).     CONTACT DERMATOLOGY IF YOUR ECZEMA DOESN'T IMPROVE

## 2013-11-03 ENCOUNTER — Telehealth: Payer: Self-pay

## 2013-11-03 DIAGNOSIS — F32A Depression, unspecified: Secondary | ICD-10-CM

## 2013-11-03 DIAGNOSIS — F329 Major depressive disorder, single episode, unspecified: Secondary | ICD-10-CM

## 2013-11-03 MED ORDER — CITALOPRAM HYDROBROMIDE 40 MG PO TABS: 40.0000 mg | ORAL_TABLET | Freq: Every day | ORAL | Status: DC

## 2013-11-03 NOTE — Telephone Encounter (Signed)
Patient mother is requesting celexa refill.

## 2014-02-14 ENCOUNTER — Encounter
Payer: BC Managed Care – PPO | Attending: Physical Medicine & Rehabilitation | Admitting: Physical Medicine & Rehabilitation

## 2014-02-14 ENCOUNTER — Encounter: Payer: Self-pay | Admitting: Physical Medicine & Rehabilitation

## 2014-02-14 VITALS — BP 112/67 | HR 74 | Resp 14 | Ht 73.0 in | Wt 252.0 lb

## 2014-02-14 DIAGNOSIS — J3489 Other specified disorders of nose and nasal sinuses: Secondary | ICD-10-CM | POA: Insufficient documentation

## 2014-02-14 DIAGNOSIS — F329 Major depressive disorder, single episode, unspecified: Secondary | ICD-10-CM | POA: Diagnosis not present

## 2014-02-14 DIAGNOSIS — G44309 Post-traumatic headache, unspecified, not intractable: Secondary | ICD-10-CM | POA: Insufficient documentation

## 2014-02-14 DIAGNOSIS — S069X9S Unspecified intracranial injury with loss of consciousness of unspecified duration, sequela: Secondary | ICD-10-CM | POA: Insufficient documentation

## 2014-02-14 DIAGNOSIS — D62 Acute posthemorrhagic anemia: Secondary | ICD-10-CM | POA: Insufficient documentation

## 2014-02-14 DIAGNOSIS — S069XAS Unspecified intracranial injury with loss of consciousness status unknown, sequela: Secondary | ICD-10-CM | POA: Diagnosis not present

## 2014-02-14 DIAGNOSIS — F3289 Other specified depressive episodes: Secondary | ICD-10-CM | POA: Diagnosis not present

## 2014-02-14 DIAGNOSIS — S069X5S Unspecified intracranial injury with loss of consciousness greater than 24 hours with return to pre-existing conscious level, sequela: Secondary | ICD-10-CM

## 2014-02-14 MED ORDER — METHYLPHENIDATE HCL ER (LA) 10 MG PO CP24: 10.0000 mg | ORAL_CAPSULE | Freq: Every day | ORAL | Status: DC

## 2014-02-14 NOTE — Patient Instructions (Signed)
PLEASE CALL ME WITH ANY PROBLEMS OR QUESTIONS (#[phone]).      
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
## 2014-02-28 ENCOUNTER — Other Ambulatory Visit: Payer: Self-pay

## 2014-02-28 DIAGNOSIS — F329 Major depressive disorder, single episode, unspecified: Secondary | ICD-10-CM

## 2014-02-28 DIAGNOSIS — F32A Depression, unspecified: Secondary | ICD-10-CM

## 2014-02-28 MED ORDER — CITALOPRAM HYDROBROMIDE 40 MG PO TABS: 40.0000 mg | ORAL_TABLET | Freq: Every day | ORAL | Status: DC

## 2014-03-14 ENCOUNTER — Encounter: Payer: Self-pay | Admitting: Family Medicine

## 2014-03-14 ENCOUNTER — Ambulatory Visit (INDEPENDENT_AMBULATORY_CARE_PROVIDER_SITE_OTHER): Payer: BC Managed Care – PPO | Admitting: Family Medicine

## 2014-03-14 ENCOUNTER — Telehealth: Payer: Self-pay | Admitting: Internal Medicine

## 2014-03-14 VITALS — BP 120/78 | HR 74 | Wt 258.0 lb

## 2014-03-14 DIAGNOSIS — S6010XA Contusion of unspecified finger with damage to nail, initial encounter: Secondary | ICD-10-CM

## 2014-03-14 DIAGNOSIS — S6000XA Contusion of unspecified finger without damage to nail, initial encounter: Secondary | ICD-10-CM

## 2014-03-14 MED ORDER — CEPHALEXIN 500 MG PO CAPS: 500.0000 mg | ORAL_CAPSULE | Freq: Three times a day (TID) | ORAL | Status: DC
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
## 2014-03-14 NOTE — Patient Instructions (Signed)
Subungual Hematoma °A subungual hematoma is a pocket of blood that collects under the fingernail or toenail. The pressure created by the blood under the nail can cause pain. °CAUSES  °A subungual hematoma occurs when an injury to the finger or toe causes a blood vessel beneath the nail to break. The injury can occur from a direct blow such as slamming a finger in a door. It can also occur from a repeated injury such as pressure on the foot in a shoe while running. A subungual hematoma is sometimes called runner's toe or tennis toe. °SYMPTOMS  °· Blue or dark blue skin under the nail. °· Pain or throbbing in the injured area. °DIAGNOSIS  °Your caregiver can determine whether you have a subungual hematoma based on your history and a physical exam. If your caregiver thinks you might have a broken (fractured) bone, X-rays may be taken. °TREATMENT  °Hematomas usually go away on their own over time. Your caregiver may make a hole in the nail to drain the blood. Draining the blood is painless and usually provides significant relief from pain and throbbing. The nail usually grows back normally after this procedure. In some cases, the nail may need to be removed. This is done if there is a cut under the nail that requires stitches (sutures). °HOME CARE INSTRUCTIONS  °· Put ice on the injured area. °¨ Put ice in a plastic bag. °¨ Place a towel between your skin and the bag. °¨ Leave the ice on for 15-20 minutes, 03-04 times a day for the first 1 to 2 days. °· Elevate the injured area to help decrease pain and swelling. °· If you were given a bandage, wear it for as long as directed by your caregiver. °· If part of your nail falls off, trim the remaining nail gently. This prevents the nail from catching on something and causing further injury. °· Only take over-the-counter or prescription medicines for pain, discomfort, or fever as directed by your caregiver. °SEEK IMMEDIATE MEDICAL CARE IF:  °· You have redness or swelling  around the nail. °· You have yellowish-white fluid (pus) coming from the nail. °· Your pain is not controlled with medicine. °· You have a fever. °MAKE SURE YOU: °· Understand these instructions. °· Will watch your condition. °· Will get help right away if you are not doing well or get worse. °Document Released: 06/12/2000 Document Revised: 09/07/2011 Document Reviewed: 06/03/2011 °Exit[employer]® Patient Information ©2015 [employer]. This information is not intended to replace advice given to you by your health care provider. Make sure you discuss any questions you have with your health care provider. ° °
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
## 2014-03-14 NOTE — Progress Notes (Signed)
Pre visit review using our clinic review tool, if applicable. No additional management support is needed unless otherwise documented below in the visit note. 

## 2014-03-15 ENCOUNTER — Ambulatory Visit (INDEPENDENT_AMBULATORY_CARE_PROVIDER_SITE_OTHER)
Admission: RE | Admit: 2014-03-15 | Discharge: 2014-03-15 | Disposition: A | Discharge status: Home / Self Care | Payer: BC Managed Care – PPO | Source: Ambulatory Visit | Attending: Family Medicine | Admitting: Family Medicine

## 2014-03-15 DIAGNOSIS — S6000XA Contusion of unspecified finger without damage to nail, initial encounter: Secondary | ICD-10-CM

## 2014-03-15 DIAGNOSIS — S6010XA Contusion of unspecified finger with damage to nail, initial encounter: Secondary | ICD-10-CM

## 2014-04-06 ENCOUNTER — Encounter: Payer: BC Managed Care – PPO | Admitting: Internal Medicine

## 2014-04-13 ENCOUNTER — Other Ambulatory Visit (INDEPENDENT_AMBULATORY_CARE_PROVIDER_SITE_OTHER): Payer: BC Managed Care – PPO

## 2014-04-13 ENCOUNTER — Ambulatory Visit (INDEPENDENT_AMBULATORY_CARE_PROVIDER_SITE_OTHER): Payer: BC Managed Care – PPO | Admitting: Internal Medicine

## 2014-04-13 ENCOUNTER — Encounter: Payer: Self-pay | Admitting: Internal Medicine

## 2014-04-13 VITALS — BP 112/78 | HR 64 | Temp 98.3°F | Resp 16 | Ht 73.0 in | Wt 258.0 lb

## 2014-04-13 DIAGNOSIS — F32A Depression, unspecified: Secondary | ICD-10-CM

## 2014-04-13 DIAGNOSIS — F329 Major depressive disorder, single episode, unspecified: Secondary | ICD-10-CM

## 2014-04-13 DIAGNOSIS — R739 Hyperglycemia, unspecified: Secondary | ICD-10-CM

## 2014-04-13 DIAGNOSIS — Z0000 Encounter for general adult medical examination without abnormal findings: Secondary | ICD-10-CM

## 2014-04-13 DIAGNOSIS — Z23 Encounter for immunization: Secondary | ICD-10-CM

## 2014-04-13 LAB — COMPREHENSIVE METABOLIC PANEL
ALBUMIN: 3.5 g/dL (ref 3.5–5.2)
ALT: 21 U/L (ref 0–53)
AST: 18 U/L (ref 0–37)
Alkaline Phosphatase: 62 U/L (ref 39–117)
BUN: 15 mg/dL (ref 6–23)
CALCIUM: 9.3 mg/dL (ref 8.4–10.5)
CHLORIDE: 106 meq/L (ref 96–112)
CO2: 27 mEq/L (ref 19–32)
Creatinine, Ser: 1 mg/dL (ref 0.4–1.5)
GFR: 102.9 mL/min (ref >60.00)
Glucose, Bld: 88 mg/dL (ref 70–99)
POTASSIUM: 4.3 meq/L (ref 3.5–5.1)
Sodium: 139 mEq/L (ref 135–145)
Total Bilirubin: 0.6 mg/dL (ref 0.2–1.2)
Total Protein: 7.3 g/dL (ref 6.0–8.3)

## 2014-04-13 LAB — CBC WITH DIFFERENTIAL/PLATELET
Basophils Absolute: 0.1 10*3/uL (ref 0.0–0.1)
Basophils Relative: 1.3 % (ref 0.0–3.0)
EOS ABS: 0.2 10*3/uL (ref 0.0–0.7)
Eosinophils Relative: 4.2 % (ref 0.0–5.0)
HCT: 45 % (ref 39.0–52.0)
Hemoglobin: 14.3 g/dL (ref 13.0–17.0)
LYMPHS PCT: 48.1 % — AB (ref 12.0–46.0)
Lymphs Abs: 2.7 10*3/uL (ref 0.7–4.0)
MCHC: 31.8 g/dL (ref 30.0–36.0)
MCV: 87.1 fl (ref 78.0–100.0)
Monocytes Absolute: 0.3 10*3/uL (ref 0.1–1.0)
Monocytes Relative: 5.5 % (ref 3.0–12.0)
NEUTROS ABS: 2.3 10*3/uL (ref 1.4–7.7)
NEUTROS PCT: 40.9 % — AB (ref 43.0–77.0)
Platelets: 160 10*3/uL (ref 150.0–400.0)
RBC: 5.17 Mil/uL (ref 4.22–5.81)
RDW: 14.9 % (ref 11.5–15.5)
WBC: 5.5 10*3/uL (ref 4.0–10.5)

## 2014-04-13 LAB — LIPID PANEL
CHOL/HDL RATIO: 6
Cholesterol: 173 mg/dL (ref 0–200)
HDL: 30.6 mg/dL — ABNORMAL LOW (ref >39.00)
LDL Cholesterol: 122 mg/dL — ABNORMAL HIGH (ref 0–99)
NONHDL: 142.4
TRIGLYCERIDES: 102 mg/dL (ref 0.0–149.0)
VLDL: 20.4 mg/dL (ref 0.0–40.0)

## 2014-04-13 LAB — HEMOGLOBIN A1C: Hgb A1c MFr Bld: 5.5 % (ref 4.6–6.5)

## 2014-04-13 LAB — PSA: PSA: 0.76 ng/mL (ref 0.10–4.00)

## 2014-04-13 LAB — TSH: TSH: 0.98 u[IU]/mL (ref 0.35–4.50)

## 2014-04-13 LAB — FECAL OCCULT BLOOD, GUAIAC: Fecal Occult Blood: NEGATIVE
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
## 2014-04-13 NOTE — Patient Instructions (Signed)

## 2014-04-15 ENCOUNTER — Encounter: Payer: Self-pay | Admitting: Internal Medicine

## 2014-04-15 NOTE — Assessment & Plan Note (Signed)
He has pre-diabetes 

## 2014-04-15 NOTE — Assessment & Plan Note (Addendum)

## 2014-04-15 NOTE — Assessment & Plan Note (Signed)
Psych referral

## 2014-04-30 ENCOUNTER — Telehealth: Payer: Self-pay | Admitting: *Deleted

## 2014-04-30 DIAGNOSIS — G44309 Post-traumatic headache, unspecified, not intractable: Secondary | ICD-10-CM

## 2014-04-30 DIAGNOSIS — S069X5S Unspecified intracranial injury with loss of consciousness greater than 24 hours with return to pre-existing conscious level, sequela: Secondary | ICD-10-CM

## 2014-04-30 MED ORDER — METHYLPHENIDATE HCL ER (LA) 10 MG PO CP24: 10.0000 mg | ORAL_CAPSULE | Freq: Every day | ORAL | Status: DC

## 2014-04-30 NOTE — Telephone Encounter (Signed)
Mom called and asked for Ritalin to be refilled.  Will have Dr sign and call pt

## 2014-05-02 ENCOUNTER — Telehealth: Payer: Self-pay | Admitting: *Deleted

## 2014-05-02 NOTE — Telephone Encounter (Signed)
Called patient - left message with his mother that patients RX for ritalin is ready and can pick it up .  Friday must be before 3pm

## 2014-06-08 ENCOUNTER — Ambulatory Visit (INDEPENDENT_AMBULATORY_CARE_PROVIDER_SITE_OTHER): Payer: Medicare Other | Admitting: Licensed Clinical Social Worker

## 2014-06-08 DIAGNOSIS — F4323 Adjustment disorder with mixed anxiety and depressed mood: Secondary | ICD-10-CM

## 2014-06-13 ENCOUNTER — Encounter: Payer: Self-pay | Admitting: Physical Medicine & Rehabilitation

## 2014-06-13 ENCOUNTER — Encounter: Payer: Medicare Other | Attending: Physical Medicine & Rehabilitation | Admitting: Physical Medicine & Rehabilitation

## 2014-06-13 ENCOUNTER — Ambulatory Visit (INDEPENDENT_AMBULATORY_CARE_PROVIDER_SITE_OTHER): Payer: Medicare Other | Admitting: Psychology

## 2014-06-13 ENCOUNTER — Other Ambulatory Visit: Payer: Self-pay | Admitting: Physical Medicine & Rehabilitation

## 2014-06-13 VITALS — BP 122/78 | HR 68 | Resp 14

## 2014-06-13 DIAGNOSIS — M13812 Other specified arthritis, left shoulder: Secondary | ICD-10-CM | POA: Diagnosis not present

## 2014-06-13 DIAGNOSIS — G8110 Spastic hemiplegia affecting unspecified side: Secondary | ICD-10-CM | POA: Diagnosis not present

## 2014-06-13 DIAGNOSIS — F329 Major depressive disorder, single episode, unspecified: Secondary | ICD-10-CM | POA: Diagnosis not present

## 2014-06-13 DIAGNOSIS — S069X5S Unspecified intracranial injury with loss of consciousness greater than 24 hours with return to pre-existing conscious level, sequela: Secondary | ICD-10-CM

## 2014-06-13 DIAGNOSIS — Z915 Personal history of self-harm: Secondary | ICD-10-CM | POA: Diagnosis not present

## 2014-06-13 DIAGNOSIS — Z5181 Encounter for therapeutic drug level monitoring: Secondary | ICD-10-CM | POA: Diagnosis not present

## 2014-06-13 DIAGNOSIS — G44309 Post-traumatic headache, unspecified, not intractable: Secondary | ICD-10-CM | POA: Insufficient documentation

## 2014-06-13 DIAGNOSIS — F0280 Dementia in other diseases classified elsewhere without behavioral disturbance: Secondary | ICD-10-CM | POA: Diagnosis not present

## 2014-06-13 DIAGNOSIS — H547 Unspecified visual loss: Secondary | ICD-10-CM | POA: Insufficient documentation

## 2014-06-13 DIAGNOSIS — Q688 Other specified congenital musculoskeletal deformities: Secondary | ICD-10-CM | POA: Insufficient documentation

## 2014-06-13 DIAGNOSIS — Z79899 Other long term (current) drug therapy: Secondary | ICD-10-CM | POA: Diagnosis not present

## 2014-06-13 DIAGNOSIS — S069X0S Unspecified intracranial injury without loss of consciousness, sequela: Secondary | ICD-10-CM | POA: Diagnosis not present

## 2014-06-13 DIAGNOSIS — J3489 Other specified disorders of nose and nasal sinuses: Secondary | ICD-10-CM | POA: Diagnosis not present

## 2014-06-13 DIAGNOSIS — M25569 Pain in unspecified knee: Secondary | ICD-10-CM | POA: Diagnosis not present

## 2014-06-13 DIAGNOSIS — D62 Acute posthemorrhagic anemia: Secondary | ICD-10-CM | POA: Diagnosis not present

## 2014-06-13 DIAGNOSIS — G894 Chronic pain syndrome: Secondary | ICD-10-CM

## 2014-06-13 DIAGNOSIS — F32A Depression, unspecified: Secondary | ICD-10-CM

## 2014-06-13 MED ORDER — METHYLPHENIDATE HCL 5 MG PO TABS: 5.0000 mg | ORAL_TABLET | Freq: Two times a day (BID) | ORAL | Status: DC

## 2014-06-13 MED ORDER — TRAMADOL HCL 50 MG PO TABS: 50.0000 mg | ORAL_TABLET | Freq: Three times a day (TID) | ORAL | Status: DC | PRN
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
## 2014-06-13 NOTE — Addendum Note (Signed)
Addended by: Geryl Rankins D on: 06/13/2014 11:40 AM   Modules accepted: Orders

## 2014-06-13 NOTE — Patient Instructions (Signed)
PLEASE CALL ME WITH ANY PROBLEMS OR QUESTIONS (#[phone]).     MAKE SURE YOU TAKE REST BREAKS AND WORK ON STRESS MANAGEMENT

## 2014-06-14 LAB — PRESCRIPTION MONITORING PROFILE (SOLSTAS)
Amphetamine/Meth: NEGATIVE ng/mL
BARBITURATE SCREEN, URINE: NEGATIVE ng/mL
BENZODIAZEPINE SCREEN, URINE: NEGATIVE ng/mL
Buprenorphine, Urine: NEGATIVE ng/mL
CARISOPRODOL, URINE: NEGATIVE ng/mL
COCAINE METABOLITES: NEGATIVE ng/mL
Cannabinoid Scrn, Ur: NEGATIVE ng/mL
Creatinine, Urine: 189.31 mg/dL (ref >20.0)
Fentanyl, Ur: NEGATIVE ng/mL
MDMA URINE: NEGATIVE ng/mL
Meperidine, Ur: NEGATIVE ng/mL
Methadone Screen, Urine: NEGATIVE ng/mL
Nitrites, Initial: NEGATIVE ug/mL
OPIATE SCREEN, URINE: NEGATIVE ng/mL
Oxycodone Screen, Ur: NEGATIVE ng/mL
Propoxyphene: NEGATIVE ng/mL
TAPENTADOLUR: NEGATIVE ng/mL
TRAMADOL UR: NEGATIVE ng/mL
ZOLPIDEM, URINE: NEGATIVE ng/mL
pH, Initial: 6.8 pH (ref 4.5–8.9)

## 2014-06-14 LAB — PMP ALCOHOL METABOLITE (ETG): Ethyl Glucuronide (EtG): NEGATIVE ng/mL

## 2014-06-25 ENCOUNTER — Encounter: Payer: Self-pay | Admitting: Internal Medicine

## 2014-06-25 ENCOUNTER — Ambulatory Visit (INDEPENDENT_AMBULATORY_CARE_PROVIDER_SITE_OTHER): Payer: Medicare Other | Admitting: Internal Medicine

## 2014-06-25 ENCOUNTER — Other Ambulatory Visit: Payer: Medicare Other

## 2014-06-25 VITALS — BP 122/78 | HR 56 | Temp 97.8°F | Resp 16 | Ht 73.0 in | Wt 263.0 lb

## 2014-06-25 DIAGNOSIS — IMO0001 Reserved for inherently not codable concepts without codable children: Secondary | ICD-10-CM

## 2014-06-25 DIAGNOSIS — L03011 Cellulitis of right finger: Secondary | ICD-10-CM

## 2014-06-25 MED ORDER — SULFAMETHOXAZOLE-TRIMETHOPRIM 800-160 MG PO TABS: 1.0000 | ORAL_TABLET | Freq: Two times a day (BID) | ORAL | Status: AC

## 2014-06-25 NOTE — Patient Instructions (Signed)

## 2014-06-27 ENCOUNTER — Ambulatory Visit (INDEPENDENT_AMBULATORY_CARE_PROVIDER_SITE_OTHER): Payer: Medicare Other | Admitting: Psychology

## 2014-06-27 DIAGNOSIS — F0280 Dementia in other diseases classified elsewhere without behavioral disturbance: Secondary | ICD-10-CM | POA: Diagnosis not present

## 2014-06-28 LAB — WOUND CULTURE: GRAM STAIN: NONE SEEN

## 2014-07-01 NOTE — Assessment & Plan Note (Signed)
I and D performed A clx was sent Will start bactrim to treat the infection Will RTC in 1 week for a recheck, sooner if needed

## 2014-07-01 NOTE — Progress Notes (Signed)
   Subjective:    Patient ID: Mark [name], male    DOB: 01-May-1966, 49 y.o.   MRN: [id number]  HPI  He complains of worsening pain, redness, swelling on his right index finger for 1 week. There has not been any injury or trauma.  Review of Systems  Constitutional: Negative for fever and fatigue.  HENT: Negative.   Respiratory: Negative for shortness of breath.   Cardiovascular: Negative.   Gastrointestinal: Negative.  Negative for abdominal pain.  Musculoskeletal: Negative.   Skin: Negative.  Negative for rash.  Allergic/Immunologic: Negative.   Neurological: Negative.   Hematological: Negative.   Psychiatric/Behavioral: Negative.   All other systems reviewed and are negative.      Objective:   Physical Exam  Musculoskeletal:       Right hand: He exhibits deformity. He exhibits no bony tenderness and normal capillary refill.       Hands: The area was cleaned with betadine then prepped and draped in sterile fashion, local anesthesia was obtained with an injection of pain 1% lido over the dorsal surface of the distal phalanx just proximal to the affected area. Next an 11 blade was used to make a small incision and a copious amount of pus was expressed and removed, the cavity was irrigated with H2O2. Hemostasis was obtained and a dressing was applied, there is good capillary refill. He tolerated the proc well.          Assessment & Plan:

## 2014-07-02 ENCOUNTER — Ambulatory Visit (INDEPENDENT_AMBULATORY_CARE_PROVIDER_SITE_OTHER): Payer: Medicare Other | Admitting: Internal Medicine

## 2014-07-02 ENCOUNTER — Encounter: Payer: Self-pay | Admitting: Internal Medicine

## 2014-07-02 VITALS — BP 124/82 | HR 69 | Temp 98.2°F | Resp 16 | Ht 73.0 in | Wt 260.0 lb

## 2014-07-02 DIAGNOSIS — IMO0001 Reserved for inherently not codable concepts without codable children: Secondary | ICD-10-CM

## 2014-07-02 DIAGNOSIS — Z23 Encounter for immunization: Secondary | ICD-10-CM

## 2014-07-02 DIAGNOSIS — L03011 Cellulitis of right finger: Secondary | ICD-10-CM

## 2014-07-02 MED ORDER — PNEUMOCOCCAL VAC POLYVALENT 25 MCG/0.5ML IJ INJ
0.5000 mL | INJECTION | INTRAMUSCULAR | Status: AC
  Administered 2014-07-02: 0.5 mL via INTRAMUSCULAR

## 2014-07-02 NOTE — Assessment & Plan Note (Signed)
Marked improvement noted The clx was positive for multiple organisms Will complete course of SMX-TMP

## 2014-07-02 NOTE — Progress Notes (Signed)
   Subjective:    Patient ID: Mark [name], male    DOB: 03/11/1966, 49 y.o.   MRN: [id number]  Wound Check He was originally treated 5 to 10 days ago. Previous treatment included I&D of abscess and oral antibiotics. There has been no drainage from the wound. There is no redness present. There is no swelling present. The pain has no pain.      Review of Systems  All other systems reviewed and are negative.      Objective:   Physical Exam  Musculoskeletal:       Hands:         Assessment & Plan:

## 2014-07-03 ENCOUNTER — Other Ambulatory Visit: Payer: Self-pay | Admitting: *Deleted

## 2014-07-03 DIAGNOSIS — F32A Depression, unspecified: Secondary | ICD-10-CM

## 2014-07-03 DIAGNOSIS — F329 Major depressive disorder, single episode, unspecified: Secondary | ICD-10-CM

## 2014-07-03 MED ORDER — CITALOPRAM HYDROBROMIDE 40 MG PO TABS: 40.0000 mg | ORAL_TABLET | Freq: Every day | ORAL | Status: DC

## 2014-07-03 NOTE — Telephone Encounter (Signed)
rec'd fax, refill request, pt's chart reviewed, Rx sent electronically, pt called

## 2014-07-04 NOTE — Progress Notes (Signed)
Urine drug screen for this encounter is consistent 

## 2014-07-13 ENCOUNTER — Ambulatory Visit (INDEPENDENT_AMBULATORY_CARE_PROVIDER_SITE_OTHER): Payer: Medicare Other | Admitting: Psychology

## 2014-07-13 DIAGNOSIS — F0280 Dementia in other diseases classified elsewhere without behavioral disturbance: Secondary | ICD-10-CM

## 2014-07-13 DIAGNOSIS — F4323 Adjustment disorder with mixed anxiety and depressed mood: Secondary | ICD-10-CM

## 2014-08-14 ENCOUNTER — Encounter: Payer: Self-pay | Admitting: Physical Medicine & Rehabilitation

## 2014-08-14 ENCOUNTER — Encounter: Payer: Medicare Other | Attending: Physical Medicine & Rehabilitation | Admitting: Physical Medicine & Rehabilitation

## 2014-08-14 VITALS — BP 140/80 | HR 68 | Resp 14

## 2014-08-14 DIAGNOSIS — Q688 Other specified congenital musculoskeletal deformities: Secondary | ICD-10-CM | POA: Diagnosis not present

## 2014-08-14 DIAGNOSIS — Z915 Personal history of self-harm: Secondary | ICD-10-CM | POA: Diagnosis not present

## 2014-08-14 DIAGNOSIS — M13812 Other specified arthritis, left shoulder: Secondary | ICD-10-CM | POA: Diagnosis not present

## 2014-08-14 DIAGNOSIS — S069X5S Unspecified intracranial injury with loss of consciousness greater than 24 hours with return to pre-existing conscious level, sequela: Secondary | ICD-10-CM | POA: Diagnosis not present

## 2014-08-14 DIAGNOSIS — D62 Acute posthemorrhagic anemia: Secondary | ICD-10-CM | POA: Insufficient documentation

## 2014-08-14 DIAGNOSIS — G44309 Post-traumatic headache, unspecified, not intractable: Secondary | ICD-10-CM | POA: Insufficient documentation

## 2014-08-14 DIAGNOSIS — F32A Depression, unspecified: Secondary | ICD-10-CM

## 2014-08-14 DIAGNOSIS — F329 Major depressive disorder, single episode, unspecified: Secondary | ICD-10-CM | POA: Diagnosis not present

## 2014-08-14 DIAGNOSIS — J3489 Other specified disorders of nose and nasal sinuses: Secondary | ICD-10-CM | POA: Diagnosis not present

## 2014-08-14 DIAGNOSIS — H547 Unspecified visual loss: Secondary | ICD-10-CM | POA: Insufficient documentation

## 2014-08-14 DIAGNOSIS — G8110 Spastic hemiplegia affecting unspecified side: Secondary | ICD-10-CM

## 2014-08-14 DIAGNOSIS — Z79899 Other long term (current) drug therapy: Secondary | ICD-10-CM | POA: Insufficient documentation

## 2014-08-14 DIAGNOSIS — S069X0S Unspecified intracranial injury without loss of consciousness, sequela: Secondary | ICD-10-CM | POA: Insufficient documentation

## 2014-08-14 DIAGNOSIS — M25569 Pain in unspecified knee: Secondary | ICD-10-CM | POA: Insufficient documentation

## 2014-08-14 MED ORDER — METHYLPHENIDATE HCL 5 MG PO TABS: 5.0000 mg | ORAL_TABLET | Freq: Two times a day (BID) | ORAL | Status: DC
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
## 2014-08-14 NOTE — Patient Instructions (Signed)
YOU NEED TO MAKE SURE YOU GET PLENTY OF SLEEP EACH NIGHT ---8-10 HOURS PER NIGHT  WORK ON YOUR STRESS MANAGEMENT ALSO

## 2014-10-12 ENCOUNTER — Other Ambulatory Visit: Payer: Self-pay | Admitting: Physical Medicine & Rehabilitation

## 2014-10-12 ENCOUNTER — Encounter: Payer: Medicare Other | Attending: Physical Medicine & Rehabilitation | Admitting: Physical Medicine & Rehabilitation

## 2014-10-12 ENCOUNTER — Encounter: Payer: Self-pay | Admitting: Physical Medicine & Rehabilitation

## 2014-10-12 VITALS — BP 125/77 | HR 62 | Resp 14

## 2014-10-12 DIAGNOSIS — J3489 Other specified disorders of nose and nasal sinuses: Secondary | ICD-10-CM | POA: Insufficient documentation

## 2014-10-12 DIAGNOSIS — Z79899 Other long term (current) drug therapy: Secondary | ICD-10-CM

## 2014-10-12 DIAGNOSIS — H547 Unspecified visual loss: Secondary | ICD-10-CM | POA: Insufficient documentation

## 2014-10-12 DIAGNOSIS — Q688 Other specified congenital musculoskeletal deformities: Secondary | ICD-10-CM | POA: Insufficient documentation

## 2014-10-12 DIAGNOSIS — D62 Acute posthemorrhagic anemia: Secondary | ICD-10-CM | POA: Insufficient documentation

## 2014-10-12 DIAGNOSIS — M25569 Pain in unspecified knee: Secondary | ICD-10-CM | POA: Insufficient documentation

## 2014-10-12 DIAGNOSIS — G894 Chronic pain syndrome: Secondary | ICD-10-CM

## 2014-10-12 DIAGNOSIS — G44309 Post-traumatic headache, unspecified, not intractable: Secondary | ICD-10-CM

## 2014-10-12 DIAGNOSIS — Z5181 Encounter for therapeutic drug level monitoring: Secondary | ICD-10-CM

## 2014-10-12 DIAGNOSIS — F329 Major depressive disorder, single episode, unspecified: Secondary | ICD-10-CM

## 2014-10-12 DIAGNOSIS — F32A Depression, unspecified: Secondary | ICD-10-CM

## 2014-10-12 DIAGNOSIS — S069X0S Unspecified intracranial injury without loss of consciousness, sequela: Secondary | ICD-10-CM | POA: Insufficient documentation

## 2014-10-12 DIAGNOSIS — M13812 Other specified arthritis, left shoulder: Secondary | ICD-10-CM | POA: Insufficient documentation

## 2014-10-12 DIAGNOSIS — Z915 Personal history of self-harm: Secondary | ICD-10-CM | POA: Insufficient documentation

## 2014-10-12 DIAGNOSIS — G8110 Spastic hemiplegia affecting unspecified side: Secondary | ICD-10-CM

## 2014-10-12 DIAGNOSIS — S069X5S Unspecified intracranial injury with loss of consciousness greater than 24 hours with return to pre-existing conscious level, sequela: Secondary | ICD-10-CM

## 2014-10-12 MED ORDER — METHYLPHENIDATE HCL 5 MG PO TABS: 5.0000 mg | ORAL_TABLET | Freq: Two times a day (BID) | ORAL | Status: DC

## 2014-10-12 MED ORDER — TOPIRAMATE 25 MG PO TABS: 25.0000 mg | ORAL_TABLET | Freq: Every day | ORAL | Status: DC

## 2014-10-12 MED ORDER — NAPROXEN SODIUM 220 MG PO CAPS: 220.0000 mg | ORAL_CAPSULE | Freq: Every day | ORAL | Status: DC | PRN
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
## 2014-10-12 NOTE — Patient Instructions (Signed)
TAKE TOPAMAX AT BEDTIME. USE 25MG  FOR ONE WEEK---IF NO BENEFIT, THEN INCREASE TO 50MG 

## 2014-10-13 LAB — PRESCRIPTION MONITORING PROFILE (SOLSTAS)
AMPHETAMINE/METH: NEGATIVE ng/mL
Barbiturate Screen, Urine: NEGATIVE ng/mL
Benzodiazepine Screen, Urine: NEGATIVE ng/mL
Buprenorphine, Urine: NEGATIVE ng/mL
Cannabinoid Scrn, Ur: NEGATIVE ng/mL
Carisoprodol, Urine: NEGATIVE ng/mL
Cocaine Metabolites: NEGATIVE ng/mL
Creatinine, Urine: 257.33 mg/dL (ref >20.0)
Fentanyl, Ur: NEGATIVE ng/mL
MDMA URINE: NEGATIVE ng/mL
Meperidine, Ur: NEGATIVE ng/mL
Methadone Screen, Urine: NEGATIVE ng/mL
NITRITES URINE, INITIAL: NEGATIVE ug/mL
Opiate Screen, Urine: NEGATIVE ng/mL
Oxycodone Screen, Ur: NEGATIVE ng/mL
Propoxyphene: NEGATIVE ng/mL
TRAMADOL UR: NEGATIVE ng/mL
Tapentadol, urine: NEGATIVE ng/mL
Zolpidem, Urine: NEGATIVE ng/mL
pH, Initial: 6 pH (ref 4.5–8.9)

## 2014-10-13 LAB — PMP ALCOHOL METABOLITE (ETG): Ethyl Glucuronide (EtG): NEGATIVE ng/mL

## 2014-10-15 LAB — METHYLPHENIDATE METAB, QUANT, U: Ritalinic Acid: 4304 ng/mL — ABNORMAL HIGH (ref <100)

## 2014-10-26 NOTE — Progress Notes (Addendum)
UDS today for Ritalin.  Urine drug screen for this encounter is consistent for prescribed medication

## 2014-10-31 ENCOUNTER — Other Ambulatory Visit: Payer: Self-pay | Admitting: *Deleted

## 2014-10-31 DIAGNOSIS — F329 Major depressive disorder, single episode, unspecified: Secondary | ICD-10-CM

## 2014-10-31 DIAGNOSIS — F32A Depression, unspecified: Secondary | ICD-10-CM

## 2014-10-31 MED ORDER — CITALOPRAM HYDROBROMIDE 40 MG PO TABS: 40.0000 mg | ORAL_TABLET | Freq: Every day | ORAL | Status: DC

## 2014-12-18 ENCOUNTER — Encounter: Payer: Self-pay | Admitting: Physical Medicine & Rehabilitation

## 2014-12-18 ENCOUNTER — Encounter: Payer: Medicare Other | Attending: Physical Medicine & Rehabilitation | Admitting: Physical Medicine & Rehabilitation

## 2014-12-18 VITALS — BP 129/85 | HR 62 | Resp 14

## 2014-12-18 DIAGNOSIS — Z79899 Other long term (current) drug therapy: Secondary | ICD-10-CM | POA: Diagnosis not present

## 2014-12-18 DIAGNOSIS — M19019 Primary osteoarthritis, unspecified shoulder: Secondary | ICD-10-CM

## 2014-12-18 DIAGNOSIS — M25569 Pain in unspecified knee: Secondary | ICD-10-CM | POA: Diagnosis not present

## 2014-12-18 DIAGNOSIS — F329 Major depressive disorder, single episode, unspecified: Secondary | ICD-10-CM | POA: Diagnosis not present

## 2014-12-18 DIAGNOSIS — D62 Acute posthemorrhagic anemia: Secondary | ICD-10-CM | POA: Diagnosis not present

## 2014-12-18 DIAGNOSIS — S069X5S Unspecified intracranial injury with loss of consciousness greater than 24 hours with return to pre-existing conscious level, sequela: Secondary | ICD-10-CM

## 2014-12-18 DIAGNOSIS — S069X0S Unspecified intracranial injury without loss of consciousness, sequela: Secondary | ICD-10-CM | POA: Insufficient documentation

## 2014-12-18 DIAGNOSIS — G44309 Post-traumatic headache, unspecified, not intractable: Secondary | ICD-10-CM

## 2014-12-18 DIAGNOSIS — J3489 Other specified disorders of nose and nasal sinuses: Secondary | ICD-10-CM | POA: Insufficient documentation

## 2014-12-18 DIAGNOSIS — G8110 Spastic hemiplegia affecting unspecified side: Secondary | ICD-10-CM | POA: Diagnosis not present

## 2014-12-18 DIAGNOSIS — F32A Depression, unspecified: Secondary | ICD-10-CM

## 2014-12-18 DIAGNOSIS — M13812 Other specified arthritis, left shoulder: Secondary | ICD-10-CM | POA: Insufficient documentation

## 2014-12-18 DIAGNOSIS — H547 Unspecified visual loss: Secondary | ICD-10-CM | POA: Insufficient documentation

## 2014-12-18 DIAGNOSIS — M129 Arthropathy, unspecified: Secondary | ICD-10-CM | POA: Diagnosis not present

## 2014-12-18 DIAGNOSIS — Z915 Personal history of self-harm: Secondary | ICD-10-CM | POA: Diagnosis not present

## 2014-12-18 DIAGNOSIS — Q688 Other specified congenital musculoskeletal deformities: Secondary | ICD-10-CM | POA: Insufficient documentation

## 2014-12-18 MED ORDER — METHYLPHENIDATE HCL 5 MG PO TABS: 5.0000 mg | ORAL_TABLET | Freq: Two times a day (BID) | ORAL | Status: DC

## 2014-12-18 NOTE — Patient Instructions (Signed)
PLEASE CALL ME WITH ANY PROBLEMS OR QUESTIONS (#[phone]).  HAVE A GOOD DAY!   BE HONEST ABOUT YOUR PAST MEDICAL HISTORY.  WE CAN CONSIDER OTHER STIMULANTS POTENTIALLY TO REPLACE THE RITALIN IF NEED BE.
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
## 2015-02-22 ENCOUNTER — Encounter: Payer: Medicare Other | Attending: Physical Medicine & Rehabilitation | Admitting: Physical Medicine & Rehabilitation

## 2015-02-22 ENCOUNTER — Other Ambulatory Visit: Payer: Self-pay | Admitting: Physical Medicine & Rehabilitation

## 2015-02-22 ENCOUNTER — Encounter: Payer: Self-pay | Admitting: Physical Medicine & Rehabilitation

## 2015-02-22 VITALS — BP 132/85 | HR 72 | Resp 14

## 2015-02-22 DIAGNOSIS — Q688 Other specified congenital musculoskeletal deformities: Secondary | ICD-10-CM | POA: Insufficient documentation

## 2015-02-22 DIAGNOSIS — S93401S Sprain of unspecified ligament of right ankle, sequela: Secondary | ICD-10-CM

## 2015-02-22 DIAGNOSIS — Z915 Personal history of self-harm: Secondary | ICD-10-CM | POA: Diagnosis not present

## 2015-02-22 DIAGNOSIS — J3489 Other specified disorders of nose and nasal sinuses: Secondary | ICD-10-CM | POA: Diagnosis not present

## 2015-02-22 DIAGNOSIS — G8110 Spastic hemiplegia affecting unspecified side: Secondary | ICD-10-CM | POA: Diagnosis not present

## 2015-02-22 DIAGNOSIS — F32A Depression, unspecified: Secondary | ICD-10-CM

## 2015-02-22 DIAGNOSIS — F329 Major depressive disorder, single episode, unspecified: Secondary | ICD-10-CM | POA: Insufficient documentation

## 2015-02-22 DIAGNOSIS — S069X5S Unspecified intracranial injury with loss of consciousness greater than 24 hours with return to pre-existing conscious level, sequela: Secondary | ICD-10-CM

## 2015-02-22 DIAGNOSIS — M13812 Other specified arthritis, left shoulder: Secondary | ICD-10-CM | POA: Diagnosis not present

## 2015-02-22 DIAGNOSIS — Z5181 Encounter for therapeutic drug level monitoring: Secondary | ICD-10-CM

## 2015-02-22 DIAGNOSIS — S93401A Sprain of unspecified ligament of right ankle, initial encounter: Secondary | ICD-10-CM | POA: Insufficient documentation

## 2015-02-22 DIAGNOSIS — D62 Acute posthemorrhagic anemia: Secondary | ICD-10-CM | POA: Insufficient documentation

## 2015-02-22 DIAGNOSIS — G44329 Chronic post-traumatic headache, not intractable: Secondary | ICD-10-CM

## 2015-02-22 DIAGNOSIS — H547 Unspecified visual loss: Secondary | ICD-10-CM | POA: Diagnosis not present

## 2015-02-22 DIAGNOSIS — M25569 Pain in unspecified knee: Secondary | ICD-10-CM | POA: Insufficient documentation

## 2015-02-22 DIAGNOSIS — G894 Chronic pain syndrome: Secondary | ICD-10-CM | POA: Diagnosis not present

## 2015-02-22 DIAGNOSIS — Z79899 Other long term (current) drug therapy: Secondary | ICD-10-CM | POA: Insufficient documentation

## 2015-02-22 DIAGNOSIS — G44309 Post-traumatic headache, unspecified, not intractable: Secondary | ICD-10-CM | POA: Insufficient documentation

## 2015-02-22 DIAGNOSIS — S069X0S Unspecified intracranial injury without loss of consciousness, sequela: Secondary | ICD-10-CM | POA: Diagnosis not present

## 2015-02-22 MED ORDER — METHYLPHENIDATE HCL 5 MG PO TABS: 15.0000 mg | ORAL_TABLET | Freq: Two times a day (BID) | ORAL | Status: DC
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
## 2015-02-22 NOTE — Patient Instructions (Signed)
TRY A RIGHT ANKLE BRACE LIKE I SHOWED YOU  USE ICE, COMPRESSION, ELEVATION AFTER YOU ARE UP ON YOUR FEET FOR AWHILE   YOU MAY USE 2 ALLEVE WITH FOOD TWICE DAILY, AT LEAST FOR THE NEXT WEEK OR SO. CONTINUE ALLEVE FOR 2-4 WEEKS AS LONG AS YOUR ANKLE/KNEE STILL BOTHER YOU.  CONTACT YOUR BOSS AND JOB COACH ABOUT GIVING YOU EXTRA TIME.

## 2015-02-23 LAB — PMP ALCOHOL METABOLITE (ETG): Ethyl Glucuronide (EtG): NEGATIVE ng/mL

## 2015-02-26 LAB — PRESCRIPTION MONITORING PROFILE (SOLSTAS)
AMPHETAMINE/METH: NEGATIVE ng/mL
BARBITURATE SCREEN, URINE: NEGATIVE ng/mL
BENZODIAZEPINE SCREEN, URINE: NEGATIVE ng/mL
Buprenorphine, Urine: NEGATIVE ng/mL
Cannabinoid Scrn, Ur: NEGATIVE ng/mL
Carisoprodol, Urine: NEGATIVE ng/mL
Cocaine Metabolites: NEGATIVE ng/mL
Creatinine, Urine: 242.67 mg/dL (ref >20.0)
Fentanyl, Ur: NEGATIVE ng/mL
MDMA URINE: NEGATIVE ng/mL
METHADONE SCREEN, URINE: NEGATIVE ng/mL
Meperidine, Ur: NEGATIVE ng/mL
NITRITES URINE, INITIAL: NEGATIVE ug/mL
OXYCODONE SCRN UR: NEGATIVE ng/mL
Opiate Screen, Urine: NEGATIVE ng/mL
PROPOXYPHENE: NEGATIVE ng/mL
TAPENTADOLUR: NEGATIVE ng/mL
Tramadol Scrn, Ur: NEGATIVE ng/mL
Zolpidem, Urine: NEGATIVE ng/mL
pH, Initial: 5.5 pH (ref 4.5–8.9)

## 2015-02-28 LAB — METHYLPHENIDATE METAB, QUANT, U: RITALINIC ACID: 9598 ng/mL — AB (ref <100)

## 2015-03-05 ENCOUNTER — Telehealth: Payer: Self-pay | Admitting: *Deleted

## 2015-03-05 DIAGNOSIS — F32A Depression, unspecified: Secondary | ICD-10-CM

## 2015-03-05 DIAGNOSIS — F329 Major depressive disorder, single episode, unspecified: Secondary | ICD-10-CM

## 2015-03-05 MED ORDER — CITALOPRAM HYDROBROMIDE 40 MG PO TABS: 40.0000 mg | ORAL_TABLET | Freq: Every day | ORAL | Status: DC

## 2015-03-05 NOTE — Telephone Encounter (Signed)
Taher needs refill on celexa.  Sent to pharmacy.

## 2015-03-18 NOTE — Progress Notes (Signed)
Urine drug screen for this encounter is consistent for prescribed medication 

## 2015-04-17 ENCOUNTER — Encounter: Payer: Medicare Other | Admitting: Physical Medicine & Rehabilitation

## 2015-04-17 ENCOUNTER — Ambulatory Visit: Payer: Medicare Other | Admitting: Internal Medicine

## 2015-04-23 ENCOUNTER — Other Ambulatory Visit (INDEPENDENT_AMBULATORY_CARE_PROVIDER_SITE_OTHER): Payer: Medicare Other

## 2015-04-23 ENCOUNTER — Ambulatory Visit (INDEPENDENT_AMBULATORY_CARE_PROVIDER_SITE_OTHER): Payer: Medicare Other | Admitting: Internal Medicine

## 2015-04-23 ENCOUNTER — Encounter: Payer: Self-pay | Admitting: Internal Medicine

## 2015-04-23 VITALS — BP 125/80 | HR 56 | Temp 98.0°F | Resp 16 | Wt 270.0 lb

## 2015-04-23 DIAGNOSIS — N400 Enlarged prostate without lower urinary tract symptoms: Secondary | ICD-10-CM | POA: Insufficient documentation

## 2015-04-23 DIAGNOSIS — R739 Hyperglycemia, unspecified: Secondary | ICD-10-CM | POA: Diagnosis not present

## 2015-04-23 DIAGNOSIS — Z23 Encounter for immunization: Secondary | ICD-10-CM | POA: Diagnosis not present

## 2015-04-23 DIAGNOSIS — E785 Hyperlipidemia, unspecified: Secondary | ICD-10-CM | POA: Diagnosis not present

## 2015-04-23 DIAGNOSIS — Z0000 Encounter for general adult medical examination without abnormal findings: Secondary | ICD-10-CM

## 2015-04-23 LAB — CBC WITH DIFFERENTIAL/PLATELET
BASOS ABS: 0 10*3/uL (ref 0.0–0.1)
BASOS PCT: 0.6 % (ref 0.0–3.0)
Eosinophils Absolute: 0.2 10*3/uL (ref 0.0–0.7)
Eosinophils Relative: 4.5 % (ref 0.0–5.0)
HEMATOCRIT: 43.5 % (ref 39.0–52.0)
Hemoglobin: 14.1 g/dL (ref 13.0–17.0)
LYMPHS PCT: 51.9 % — AB (ref 12.0–46.0)
Lymphs Abs: 2.5 10*3/uL (ref 0.7–4.0)
MCHC: 32.3 g/dL (ref 30.0–36.0)
MCV: 86.8 fl (ref 78.0–100.0)
MONOS PCT: 5.6 % (ref 3.0–12.0)
Monocytes Absolute: 0.3 10*3/uL (ref 0.1–1.0)
NEUTROS ABS: 1.8 10*3/uL (ref 1.4–7.7)
Neutrophils Relative %: 37.4 % — ABNORMAL LOW (ref 43.0–77.0)
PLATELETS: 157 10*3/uL (ref 150.0–400.0)
RBC: 5.01 Mil/uL (ref 4.22–5.81)
RDW: 14.3 % (ref 11.5–15.5)
WBC: 4.8 10*3/uL (ref 4.0–10.5)

## 2015-04-23 LAB — COMPREHENSIVE METABOLIC PANEL
ALT: 15 U/L (ref 0–53)
AST: 13 U/L (ref 0–37)
Albumin: 4.1 g/dL (ref 3.5–5.2)
Alkaline Phosphatase: 53 U/L (ref 39–117)
BUN: 16 mg/dL (ref 6–23)
CALCIUM: 9.4 mg/dL (ref 8.4–10.5)
CHLORIDE: 108 meq/L (ref 96–112)
CO2: 30 meq/L (ref 19–32)
Creatinine, Ser: 0.87 mg/dL (ref 0.40–1.50)
GFR: 120.32 mL/min (ref >60.00)
Glucose, Bld: 96 mg/dL (ref 70–99)
POTASSIUM: 4.6 meq/L (ref 3.5–5.1)
Sodium: 141 mEq/L (ref 135–145)
Total Bilirubin: 0.4 mg/dL (ref 0.2–1.2)
Total Protein: 7 g/dL (ref 6.0–8.3)

## 2015-04-23 LAB — LIPID PANEL
Cholesterol: 171 mg/dL (ref 0–200)
HDL: 42.2 mg/dL (ref >39.00)
LDL CALC: 118 mg/dL — AB (ref 0–99)
NonHDL: 128.74
TRIGLYCERIDES: 56 mg/dL (ref 0.0–149.0)
Total CHOL/HDL Ratio: 4
VLDL: 11.2 mg/dL (ref 0.0–40.0)

## 2015-04-23 LAB — PSA: PSA: 0.71 ng/mL (ref 0.10–4.00)

## 2015-04-23 LAB — HEMOGLOBIN A1C: HEMOGLOBIN A1C: 5.5 % (ref 4.6–6.5)

## 2015-04-23 LAB — TSH: TSH: 0.92 u[IU]/mL (ref 0.35–4.50)

## 2015-04-23 NOTE — Progress Notes (Signed)
Subjective:  Patient ID: Mark [name], male    DOB: [birth year]  Age: 49 y.o. MRN: [id number]  CC: Annual Exam   HPI Mark [name] presents for a CPX, his only complaint is urinary hesistancy.  Outpatient Prescriptions Prior to Visit  Medication Sig Dispense Refill  . calcium citrate-vitamin D (CITRACAL+D) 315-200 MG-UNIT per tablet Take 1 tablet by mouth 2 (two) times daily.    . citalopram (CELEXA) 40 MG tablet Take 1 tablet (40 mg total) by mouth daily. 30 tablet 3  . methylphenidate (RITALIN) 5 MG tablet Take 3 tablets (15 mg total) by mouth 2 (two) times daily with breakfast and lunch. At 0700 and 1200 daily 180 tablet 0  . multivitamin (ONE-A-DAY MEN'S) TABS Take 1 tablet by mouth daily.    . Naproxen Sodium (EQ NAPROXEN SODIUM) 220 MG CAPS Take 1 capsule (220 mg total) by mouth daily as needed. 60 each 4  . hydrocortisone 1 % lotion Apply 1 application topically 2 (two) times daily.    . traMADol (ULTRAM) 50 MG tablet Take by mouth every 6 (six) hours as needed.     No facility-administered medications prior to visit.    ROS Review of Systems  Constitutional: Negative.   HENT: Negative.   Eyes: Negative.   Respiratory: Negative.  Negative for cough, choking, chest tightness, shortness of breath and stridor.   Cardiovascular: Negative.  Negative for chest pain, palpitations and leg swelling.  Gastrointestinal: Negative.  Negative for nausea, vomiting, abdominal pain, diarrhea, constipation and blood in stool.  Endocrine: Negative.   Genitourinary: Positive for difficulty urinating. Negative for dysuria, urgency, frequency, hematuria, flank pain, decreased urine volume, discharge, penile swelling, scrotal swelling, enuresis, genital sores, penile pain and testicular pain.  Musculoskeletal: Negative.   Skin: Negative.   Allergic/Immunologic: Negative.   Neurological: Negative.  Negative for dizziness, tremors, weakness, light-headedness and numbness.  Hematological: Negative.   Negative for adenopathy. Does not bruise/bleed easily.  Psychiatric/Behavioral: Negative.  Negative for confusion, sleep disturbance, dysphoric mood and agitation. The patient is not nervous/anxious.     Objective:  BP 125/80 mmHg  Pulse 56  Temp(Src) 98 F (36.7 C)  Resp 16  Wt 270 lb (122.471 kg)  SpO2 96%  BP Readings from Last 3 Encounters:  04/23/15 125/80  02/22/15 132/85  12/18/14 129/85    Wt Readings from Last 3 Encounters:  04/23/15 270 lb (122.471 kg)  07/02/14 260 lb (117.935 kg)  06/25/14 263 lb (119.296 kg)    Physical Exam  Constitutional: He is oriented to person, place, and time. He appears well-developed and well-nourished. No distress.  HENT:  Head: Normocephalic and atraumatic.  Mouth/Throat: Oropharynx is clear and moist. No oropharyngeal exudate.  Eyes: Conjunctivae are normal. Right eye exhibits no discharge. Left eye exhibits no discharge. No scleral icterus.  Neck: Normal range of motion. Neck supple. No JVD present. No tracheal deviation present. No thyromegaly present.  Cardiovascular: Normal rate, regular rhythm, normal heart sounds and intact distal pulses.  Exam reveals no gallop and no friction rub.   No murmur heard. Pulmonary/Chest: Effort normal and breath sounds normal. No stridor. No respiratory distress. He has no wheezes. He has no rales. He exhibits no tenderness.  Abdominal: Soft. Bowel sounds are normal. He exhibits no distension and no mass. There is no tenderness. There is no rebound and no guarding. Hernia confirmed negative in the right inguinal area and confirmed negative in the left inguinal area.  Genitourinary: Rectum normal, testes normal and penis normal.  Rectal exam shows no external hemorrhoid, no internal hemorrhoid, no fissure, no mass, no tenderness and anal tone normal. Guaiac negative stool. Prostate is enlarged (1+ smooth symm BPH). Prostate is not tender. Right testis shows no mass, no swelling and no tenderness. Right  testis is descended. Left testis shows no mass, no swelling and no tenderness. Left testis is descended. Circumcised. No penile erythema or penile tenderness. No discharge found.  Musculoskeletal: Normal range of motion. He exhibits no edema or tenderness.  Lymphadenopathy:    He has no cervical adenopathy.       Right: No inguinal adenopathy present.       Left: No inguinal adenopathy present.  Neurological: He is alert and oriented to person, place, and time.  Skin: Skin is warm and dry. No rash noted. He is not diaphoretic. No erythema. No pallor.  Vitals reviewed.   Lab Results  Component Value Date   WBC 5.5 04/13/2014   HGB 14.3 04/13/2014   HCT 45.0 04/13/2014   PLT 160.0 04/13/2014   GLUCOSE 88 04/13/2014   CHOL 173 04/13/2014   TRIG 102.0 04/13/2014   HDL 30.60* 04/13/2014   LDLCALC 122* 04/13/2014   ALT 21 04/13/2014   AST 18 04/13/2014   NA 139 04/13/2014   K 4.3 04/13/2014   CL 106 04/13/2014   CREATININE 1.0 04/13/2014   BUN 15 04/13/2014   CO2 27 04/13/2014   TSH 0.98 04/13/2014   PSA 0.76 04/13/2014   INR 1.02 09/28/2011   HGBA1C 5.5 04/13/2014    Dg Finger Middle Left  03/15/2014  CLINICAL DATA:  Injury EXAM: LEFT MIDDLE FINGER 2+V COMPARISON:  None. FINDINGS: Three views of the left third finger submitted. No acute fracture or subluxation. Soft tissue swelling dorsal distal aspect. IMPRESSION: No acute fracture or subluxation. Dorsal soft tissue swelling distally. Electronically Signed   By: Lahoma Crocker M.D.   On: 03/15/2014 16:10    Assessment & Plan:   Mark [name] was seen today for annual exam.  Diagnoses and all orders for this visit:  Encounter for immunization  Hyperglycemia- will recheck his A1C to see if he has developed DM2 -     Comprehensive metabolic panel; Future -     CBC with Differential/Platelet; Future -     Hemoglobin A1c; Future  Hyperlipidemia with target LDL less than 130- FLP today, for now I do not recommend a statin -     Lipid  panel; Future -     Comprehensive metabolic panel; Future -     CBC with Differential/Platelet; Future -     TSH; Future  BPH (benign prostatic hyperplasia)- he has minimal symptoms and dose not want to treat this with a medication, I will screen for prostate cancer with a PSA -     PSA; Future  Routine general medical examination at a health care facility- exam done, vaccines were reviewed and updated, labs ordered, pt ed material was given  Other orders -     Flu Vaccine QUAD 36+ mos IM   I have discontinued Mr. Holstad's hydrocortisone and traMADol. I am also having him maintain his multivitamin, calcium citrate-vitamin D, Naproxen Sodium, methylphenidate, and citalopram.  No orders of the defined types were placed in this encounter.     Follow-up: Return in about 1 year (around 04/22/2016).  Scarlette Calico, MD

## 2015-04-23 NOTE — Progress Notes (Signed)
Pre visit review using our clinic review tool, if applicable. No additional management support is needed unless otherwise documented below in the visit note. 

## 2015-04-23 NOTE — Patient Instructions (Signed)

## 2015-04-26 ENCOUNTER — Encounter: Payer: Self-pay | Admitting: Physical Medicine & Rehabilitation

## 2015-04-26 ENCOUNTER — Encounter: Payer: Medicare Other | Attending: Physical Medicine & Rehabilitation | Admitting: Physical Medicine & Rehabilitation

## 2015-04-26 VITALS — BP 133/75 | HR 68 | Resp 15

## 2015-04-26 DIAGNOSIS — G44309 Post-traumatic headache, unspecified, not intractable: Secondary | ICD-10-CM | POA: Insufficient documentation

## 2015-04-26 DIAGNOSIS — D62 Acute posthemorrhagic anemia: Secondary | ICD-10-CM | POA: Diagnosis not present

## 2015-04-26 DIAGNOSIS — Z915 Personal history of self-harm: Secondary | ICD-10-CM | POA: Diagnosis not present

## 2015-04-26 DIAGNOSIS — M25569 Pain in unspecified knee: Secondary | ICD-10-CM | POA: Diagnosis not present

## 2015-04-26 DIAGNOSIS — F329 Major depressive disorder, single episode, unspecified: Secondary | ICD-10-CM | POA: Insufficient documentation

## 2015-04-26 DIAGNOSIS — G8110 Spastic hemiplegia affecting unspecified side: Secondary | ICD-10-CM | POA: Insufficient documentation

## 2015-04-26 DIAGNOSIS — S069X5S Unspecified intracranial injury with loss of consciousness greater than 24 hours with return to pre-existing conscious level, sequela: Secondary | ICD-10-CM

## 2015-04-26 DIAGNOSIS — H547 Unspecified visual loss: Secondary | ICD-10-CM | POA: Diagnosis not present

## 2015-04-26 DIAGNOSIS — J3489 Other specified disorders of nose and nasal sinuses: Secondary | ICD-10-CM | POA: Diagnosis not present

## 2015-04-26 DIAGNOSIS — S069X0S Unspecified intracranial injury without loss of consciousness, sequela: Secondary | ICD-10-CM | POA: Diagnosis not present

## 2015-04-26 DIAGNOSIS — Z79899 Other long term (current) drug therapy: Secondary | ICD-10-CM | POA: Diagnosis not present

## 2015-04-26 DIAGNOSIS — M13812 Other specified arthritis, left shoulder: Secondary | ICD-10-CM | POA: Diagnosis not present

## 2015-04-26 DIAGNOSIS — Q688 Other specified congenital musculoskeletal deformities: Secondary | ICD-10-CM | POA: Insufficient documentation

## 2015-04-26 MED ORDER — METHYLPHENIDATE HCL 10 MG PO TABS: 10.0000 mg | ORAL_TABLET | Freq: Two times a day (BID) | ORAL | Status: DC

## 2015-04-26 NOTE — Patient Instructions (Signed)
PLEASE CALL ME WITH ANY PROBLEMS OR QUESTIONS (#[phone]).  HAVE A GOOD DAY!   TAKE THE RITALIN ANY TIME YOU WILL NEED TO CONCENTRATE OR FOCUS FOR EXTENDED PERIOD OF TIME
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
## 2015-07-04 ENCOUNTER — Other Ambulatory Visit: Payer: Self-pay | Admitting: *Deleted

## 2015-07-04 DIAGNOSIS — F32A Depression, unspecified: Secondary | ICD-10-CM

## 2015-07-04 DIAGNOSIS — F329 Major depressive disorder, single episode, unspecified: Secondary | ICD-10-CM

## 2015-07-04 MED ORDER — CITALOPRAM HYDROBROMIDE 40 MG PO TABS: 40.0000 mg | ORAL_TABLET | Freq: Every day | ORAL | Status: DC

## 2015-07-24 ENCOUNTER — Encounter: Payer: Medicare Other | Admitting: Physical Medicine & Rehabilitation

## 2015-07-31 ENCOUNTER — Encounter: Payer: Self-pay | Admitting: Physical Medicine & Rehabilitation

## 2015-07-31 ENCOUNTER — Encounter: Payer: Medicare Other | Attending: Physical Medicine & Rehabilitation | Admitting: Physical Medicine & Rehabilitation

## 2015-07-31 VITALS — BP 133/75 | HR 68 | Resp 14

## 2015-07-31 DIAGNOSIS — S0180XA Unspecified open wound of other part of head, initial encounter: Secondary | ICD-10-CM | POA: Diagnosis not present

## 2015-07-31 DIAGNOSIS — G44309 Post-traumatic headache, unspecified, not intractable: Secondary | ICD-10-CM | POA: Diagnosis not present

## 2015-07-31 DIAGNOSIS — S069X0A Unspecified intracranial injury without loss of consciousness, initial encounter: Secondary | ICD-10-CM | POA: Insufficient documentation

## 2015-07-31 DIAGNOSIS — G8110 Spastic hemiplegia affecting unspecified side: Secondary | ICD-10-CM | POA: Diagnosis not present

## 2015-07-31 DIAGNOSIS — Z09 Encounter for follow-up examination after completed treatment for conditions other than malignant neoplasm: Secondary | ICD-10-CM | POA: Diagnosis not present

## 2015-07-31 DIAGNOSIS — Z87891 Personal history of nicotine dependence: Secondary | ICD-10-CM | POA: Insufficient documentation

## 2015-07-31 DIAGNOSIS — D62 Acute posthemorrhagic anemia: Secondary | ICD-10-CM | POA: Insufficient documentation

## 2015-07-31 DIAGNOSIS — F329 Major depressive disorder, single episode, unspecified: Secondary | ICD-10-CM | POA: Diagnosis not present

## 2015-07-31 DIAGNOSIS — S069X5S Unspecified intracranial injury with loss of consciousness greater than 24 hours with return to pre-existing conscious level, sequela: Secondary | ICD-10-CM | POA: Diagnosis not present

## 2015-07-31 DIAGNOSIS — W320XXA Accidental handgun discharge, initial encounter: Secondary | ICD-10-CM | POA: Insufficient documentation

## 2015-07-31 DIAGNOSIS — G44329 Chronic post-traumatic headache, not intractable: Secondary | ICD-10-CM

## 2015-07-31 MED ORDER — METHYLPHENIDATE HCL 10 MG PO TABS: 10.0000 mg | ORAL_TABLET | Freq: Two times a day (BID) | ORAL | Status: DC

## 2015-07-31 MED ORDER — METHYLPHENIDATE HCL 10 MG PO TABS
10.0000 mg | ORAL_TABLET | Freq: Two times a day (BID) | ORAL | Status: DC
Start: 2015-07-31 — End: 2015-07-31

## 2015-07-31 NOTE — Patient Instructions (Signed)
  PLEASE CALL ME WITH ANY PROBLEMS OR QUESTIONS (#[phone]).      
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
## 2015-10-01 ENCOUNTER — Encounter: Payer: Self-pay | Admitting: Internal Medicine

## 2015-10-01 ENCOUNTER — Ambulatory Visit (INDEPENDENT_AMBULATORY_CARE_PROVIDER_SITE_OTHER): Payer: Medicare Other | Admitting: Internal Medicine

## 2015-10-01 VITALS — BP 106/70 | HR 60 | Temp 98.6°F | Resp 16 | Ht 73.0 in | Wt 261.0 lb

## 2015-10-01 DIAGNOSIS — L03012 Cellulitis of left finger: Secondary | ICD-10-CM

## 2015-10-01 DIAGNOSIS — IMO0001 Reserved for inherently not codable concepts without codable children: Secondary | ICD-10-CM

## 2015-10-01 MED ORDER — SULFAMETHOXAZOLE-TRIMETHOPRIM 800-160 MG PO TABS: 1.0000 | ORAL_TABLET | Freq: Two times a day (BID) | ORAL | Status: AC

## 2015-10-01 NOTE — Patient Instructions (Signed)
Paronychia °Paronychia is an infection of the skin that surrounds a nail. It usually affects the skin around a fingernail, but it may also occur near a toenail. It often causes pain and swelling around the nail. This condition may come on suddenly or develop over a longer period. In some cases, a collection of pus (abscess) can form near or under the nail. Usually, paronychia is not serious and it clears up with treatment. °CAUSES °This condition may be caused by bacteria or fungi. It is commonly caused by either Streptococcus or Staphylococcus bacteria. The bacteria or fungi often cause the infection by getting into the affected area through an opening in the skin, such as a cut or a hangnail. °RISK FACTORS °This condition is more likely to develop in: °· People who get their hands wet often, such as those who work as dishwashers, bartenders, or nurses. °· People who bite their fingernails or suck their thumbs. °· People who trim their nails too short. °· People who have hangnails or injured fingertips. °· People who get manicures. °· People who have diabetes. °SYMPTOMS °Symptoms of this condition include: °· Redness and swelling of the skin near the nail. °· Tenderness around the nail when you touch the area. °· Pus-filled bumps under the cuticle. The cuticle is the skin at the base or sides of the nail. °· Fluid or pus under the nail. °· Throbbing pain in the area. °DIAGNOSIS °This condition is usually diagnosed with a physical exam. In some cases, a sample of pus may be taken from an abscess to be tested in a lab. This can help to determine what type of bacteria or fungi is causing the condition. °TREATMENT °Treatment for this condition depends on the cause and severity of the condition. If the condition is mild, it may clear up on its own in a few days. Your health care provider may recommend soaking the affected area in warm water a few times a day. When treatment is needed, the options may  include: °· Antibiotic medicine, if the condition is caused by a bacterial infection. °· Antifungal medicine, if the condition is caused by a fungal infection. °· Incision and drainage, if an abscess is present. In this procedure, the health care provider will cut open the abscess so the pus can drain out. °HOME CARE INSTRUCTIONS °· Soak the affected area in warm water if directed to do so by your health care provider. You may be told to do this for 20 minutes, 2-3 times a day. Keep the area dry in between soakings. °· Take medicines only as directed by your health care provider. °· If you were prescribed an antibiotic medicine, finish all of it even if you start to feel better. °· Keep the affected area clean. °· Do not try to drain a fluid-filled bump yourself. °· If you will be washing dishes or performing other tasks that require your hands to get wet, wear rubber gloves. You should also wear gloves if your hands might come in contact with irritating substances, such as cleaners or chemicals. °· Follow your health care provider's instructions about: °¨ Wound care. °¨ Bandage (dressing) changes and removal. °SEEK MEDICAL CARE IF: °· Your symptoms get worse or do not improve with treatment. °· You have a fever or chills. °· You have redness spreading from the affected area. °· You have continued or increased fluid, blood, or pus coming from the affected area. °· Your finger or knuckle becomes swollen or is difficult to move. °  °  This information is not intended to replace advice given to you by your health care provider. Make sure you discuss any questions you have with your health care provider. °  °Document Released: 12/09/2000 Document Revised: 10/30/2014 Document Reviewed: 05/23/2014 °Elsevier Interactive Patient Education ©2016 [employer] ° °

## 2015-10-01 NOTE — Progress Notes (Signed)
Subjective:  Patient ID: Mark [name], male    DOB: January 03, 1966  Age: 50 y.o. MRN: [id number]  CC: Hand Pain   HPI Mark [name] presents for a 2 week hx of pain, redness, swelling in his left index finger.  Outpatient Prescriptions Prior to Visit  Medication Sig Dispense Refill  . citalopram (CELEXA) 40 MG tablet Take 1 tablet (40 mg total) by mouth daily. 30 tablet 3  . methylphenidate (RITALIN) 10 MG tablet Take 1 tablet (10 mg total) by mouth 2 (two) times daily with breakfast and lunch. At 0700 and 1200 daily 60 tablet 0  . Naproxen Sodium (EQ NAPROXEN SODIUM) 220 MG CAPS Take 1 capsule (220 mg total) by mouth daily as needed. 60 each 4  . calcium citrate-vitamin D (CITRACAL+D) 315-200 MG-UNIT per tablet Take 1 tablet by mouth 2 (two) times daily. Reported on 07/31/2015     No facility-administered medications prior to visit.    ROS Review of Systems  Constitutional: Negative.  Negative for fever and chills.  HENT: Negative.   Eyes: Negative.   Respiratory: Negative.   Cardiovascular: Negative for chest pain, palpitations and leg swelling.  Gastrointestinal: Negative.  Negative for abdominal pain and diarrhea.  Endocrine: Negative.   Genitourinary: Negative.   Musculoskeletal: Negative.   Skin: Positive for color change.  Allergic/Immunologic: Negative.   Neurological: Negative.   Hematological: Negative.  Negative for adenopathy.  Psychiatric/Behavioral: Negative.     Objective:  BP 106/70 mmHg  Pulse 60  Temp(Src) 98.6 F (37 C) (Oral)  Resp 16  Ht 6\' 1"  (1.854 m)  Wt 261 lb (118.389 kg)  BMI 34.44 kg/m2  SpO2 98%  BP Readings from Last 3 Encounters:  10/01/15 106/70  07/31/15 133/75  04/26/15 133/75    Wt Readings from Last 3 Encounters:  10/01/15 261 lb (118.389 kg)  04/23/15 270 lb (122.471 kg)  07/02/14 260 lb (117.935 kg)    Physical Exam  Musculoskeletal:       Left hand: He exhibits deformity.  Left index finger-over the dorsum of the distal  phalanx proximal to the eponychial is a 1 cm area of fluctuance, erythema, warmth and tenderness. There is felon. The nailbed and the nail plate are intact and are not involved. Distal to this in the finger there is good sensation capillary refill.  Skin:  Left index finger was cleaned with Betadine and prepped and draped in sterile fashion. Local anesthesia was obtained with the installation of 1% plain lidocaine using approximately 1.5 mL. Next an 11 blade was used to make an incision on the dorsum of the finger proximal to the eponychial. A moderately sized collection of thick pus was expressed and released. The paronychia was then cleaned with hydrogen peroxide. He tolerated the procedure well. A pressure dressing was applied and hemostasis was maintained. After the procedure was completed he continues to be neurovascularly intact.    Lab Results  Component Value Date   WBC 4.8 04/23/2015   HGB 14.1 04/23/2015   HCT 43.5 04/23/2015   PLT 157.0 04/23/2015   GLUCOSE 96 04/23/2015   CHOL 171 04/23/2015   TRIG 56.0 04/23/2015   HDL 42.20 04/23/2015   LDLCALC 118* 04/23/2015   ALT 15 04/23/2015   AST 13 04/23/2015   NA 141 04/23/2015   K 4.6 04/23/2015   CL 108 04/23/2015   CREATININE 0.87 04/23/2015   BUN 16 04/23/2015   CO2 30 04/23/2015   TSH 0.92 04/23/2015   PSA 0.71 04/23/2015  INR 1.02 09/28/2011   HGBA1C 5.5 04/23/2015    Dg Finger Middle Left  03/15/2014  CLINICAL DATA:  Injury EXAM: LEFT MIDDLE FINGER 2+V COMPARISON:  None. FINDINGS: Three views of the left third finger submitted. No acute fracture or subluxation. Soft tissue swelling dorsal distal aspect. IMPRESSION: No acute fracture or subluxation. Dorsal soft tissue swelling distally. Electronically Signed   By: Lahoma Crocker M.D.   On: 03/15/2014 16:10    Assessment & Plan:   Devindra was seen today for hand pain.  Diagnoses and all orders for this visit:  Paronychia of second finger, left- Incision and drainage  complete, culture has been sent and is pending, I am concerned this is a staph infection so have started Bactrim DS. -     Wound culture; Future -     sulfamethoxazole-trimethoprim (BACTRIM DS,SEPTRA DS) 800-160 MG tablet; Take 1 tablet by mouth 2 (two) times daily.  I have discontinued Mr. Swaminathan's calcium citrate-vitamin D. I am also having him start on sulfamethoxazole-trimethoprim. Additionally, I am having him maintain his Naproxen Sodium, citalopram, and methylphenidate.  Meds ordered this encounter  Medications  . sulfamethoxazole-trimethoprim (BACTRIM DS,SEPTRA DS) 800-160 MG tablet    Sig: Take 1 tablet by mouth 2 (two) times daily.    Dispense:  10 tablet    Refill:  1     Follow-up: Return in about 2 days (around 10/03/2015).  Scarlette Calico, MD

## 2015-10-01 NOTE — Progress Notes (Signed)
Pre visit review using our clinic review tool, if applicable. No additional management support is needed unless otherwise documented below in the visit note. 

## 2015-10-02 DIAGNOSIS — L03012 Cellulitis of left finger: Secondary | ICD-10-CM | POA: Diagnosis not present

## 2015-10-03 ENCOUNTER — Encounter: Payer: Self-pay | Admitting: Internal Medicine

## 2015-10-03 ENCOUNTER — Ambulatory Visit (INDEPENDENT_AMBULATORY_CARE_PROVIDER_SITE_OTHER): Payer: Medicare Other | Admitting: Internal Medicine

## 2015-10-03 VITALS — BP 110/80 | HR 67 | Temp 98.4°F | Resp 16 | Ht 73.0 in | Wt 267.0 lb

## 2015-10-03 DIAGNOSIS — B352 Tinea manuum: Secondary | ICD-10-CM

## 2015-10-03 DIAGNOSIS — L03012 Cellulitis of left finger: Secondary | ICD-10-CM

## 2015-10-03 DIAGNOSIS — IMO0001 Reserved for inherently not codable concepts without codable children: Secondary | ICD-10-CM

## 2015-10-03 MED ORDER — TERBINAFINE HCL 250 MG PO TABS: 250.0000 mg | ORAL_TABLET | Freq: Every day | ORAL | Status: AC

## 2015-10-03 NOTE — Progress Notes (Signed)
Pre visit review using our clinic review tool, if applicable. No additional management support is needed unless otherwise documented below in the visit note. 

## 2015-10-03 NOTE — Patient Instructions (Signed)

## 2015-10-06 NOTE — Progress Notes (Signed)
Subjective:  Patient ID: Mark [name], male    DOB: [birth year]  Age: 50 y.o. MRN: [id number]  CC: Rash   HPI Brenham Rayner presents for follow-up after an incision and drainage of a paronychia on his left index finger. He tells me the area has improved significantly and there is no pain, swelling, bleeding, drainage, or exudate. He is tolerating the antibiotics without any side effects such as rash, abdominal pain, diarrhea or trouble breathing.  He also complains of a rash on the palmar side of his right hand that is been there for several weeks. He tells me that he has an upcoming appointment with dermatology. 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 He has been trying topical steroids any relief from the rash. He says there is some itching associated with it but there is also some cracking and discomfort.  Outpatient Prescriptions Prior to Visit  Medication Sig Dispense Refill  . citalopram (CELEXA) 40 MG tablet Take 1 tablet (40 mg total) by mouth daily. 30 tablet 3  . methylphenidate (RITALIN) 10 MG tablet Take 1 tablet (10 mg total) by mouth 2 (two) times daily with breakfast and lunch. At 0700 and 1200 daily 60 tablet 0  . Naproxen Sodium (EQ NAPROXEN SODIUM) 220 MG CAPS Take 1 capsule (220 mg total) by mouth daily as needed. 60 each 4  . sulfamethoxazole-trimethoprim (BACTRIM DS,SEPTRA DS) 800-160 MG tablet Take 1 tablet by mouth 2 (two) times daily. 10 tablet 1   No facility-administered medications prior to visit.    ROS Review of Systems  Constitutional: Negative.  Negative for fever, chills, diaphoresis and fatigue.  HENT: Negative.   Eyes: Negative.   Respiratory: Negative.  Negative for cough, choking and shortness of breath.   Cardiovascular: Negative.  Negative for chest pain, palpitations and leg swelling.  Gastrointestinal: Negative.  Negative for abdominal pain.  Endocrine: Negative.   Genitourinary: Negative.   Musculoskeletal: Negative.     Skin: Positive for rash.  Allergic/Immunologic: Negative.   Neurological: Negative.   Hematological: Negative.   Psychiatric/Behavioral: Negative.     Objective:  BP 110/80 mmHg  Pulse 67  Temp(Src) 98.4 F (36.9 C) (Oral)  Resp 16  Ht 6\' 1"  (1.854 m)  Wt 267 lb (121.11 kg)  BMI 35.23 kg/m2  SpO2 98%  BP Readings from Last 3 Encounters:  10/03/15 110/80  10/01/15 106/70  07/31/15 133/75    Wt Readings from Last 3 Encounters:  10/03/15 267 lb (121.11 kg)  10/01/15 261 lb (118.389 kg)  04/23/15 270 lb (122.471 kg)    Physical Exam  Musculoskeletal:       Hands:   Lab Results  Component Value Date   WBC 4.8 04/23/2015   HGB 14.1 04/23/2015   HCT 43.5 04/23/2015   PLT 157.0 04/23/2015   GLUCOSE 96 04/23/2015   CHOL 171 04/23/2015   TRIG 56.0 04/23/2015   HDL 42.20 04/23/2015   LDLCALC 118* 04/23/2015   ALT 15 04/23/2015   AST 13 04/23/2015   NA 141 04/23/2015   K 4.6 04/23/2015   CL 108 04/23/2015   CREATININE 0.87 04/23/2015   BUN 16 04/23/2015   CO2 30 04/23/2015   TSH 0.92 04/23/2015   PSA 0.71 04/23/2015   INR 1.02 09/28/2011   HGBA1C 5.5 04/23/2015    Dg Finger Middle Left  03/15/2014  CLINICAL DATA:  Injury EXAM: LEFT MIDDLE FINGER 2+V COMPARISON:  None. FINDINGS: Three views of the left third  finger submitted. No acute fracture or subluxation. Soft tissue swelling dorsal distal aspect. IMPRESSION: No acute fracture or subluxation. Dorsal soft tissue swelling distally. Electronically Signed   By: Lahoma Crocker M.D.   On: 03/15/2014 16:10    Assessment & Plan:   Verlan was seen today for rash.  Diagnoses and all orders for this visit:  Tinea manuum- I will treat this with a 2 week course of terbinafine -     terbinafine (LAMISIL) 250 MG tablet; Take 1 tablet (250 mg total) by mouth daily.  Paronychia of second finger, left- this has resolved status post incision and drainage, he will complete the course of antibiotics to treat any remaining  staph infection   I am having Mr. Liss start on terbinafine. I am also having him maintain his Naproxen Sodium, citalopram, methylphenidate, and sulfamethoxazole-trimethoprim.  Meds ordered this encounter  Medications  . terbinafine (LAMISIL) 250 MG tablet    Sig: Take 1 tablet (250 mg total) by mouth daily.    Dispense:  14 tablet    Refill:  0     Follow-up: Return if symptoms worsen or fail to improve.  Scarlette Calico, MD

## 2015-10-16 ENCOUNTER — Encounter: Payer: Self-pay | Admitting: Internal Medicine

## 2015-11-04 ENCOUNTER — Other Ambulatory Visit: Payer: Self-pay

## 2015-11-04 DIAGNOSIS — F329 Major depressive disorder, single episode, unspecified: Secondary | ICD-10-CM

## 2015-11-04 DIAGNOSIS — F32A Depression, unspecified: Secondary | ICD-10-CM

## 2015-11-04 MED ORDER — CITALOPRAM HYDROBROMIDE 40 MG PO TABS: 40.0000 mg | ORAL_TABLET | Freq: Every day | ORAL | Status: DC

## 2015-11-04 NOTE — Telephone Encounter (Signed)
Received fax, Rx sent.

## 2015-11-07 DIAGNOSIS — L906 Striae atrophicae: Secondary | ICD-10-CM | POA: Diagnosis not present

## 2015-11-07 DIAGNOSIS — L2084 Intrinsic (allergic) eczema: Secondary | ICD-10-CM | POA: Diagnosis not present

## 2015-11-27 ENCOUNTER — Encounter: Payer: Self-pay | Admitting: Physical Medicine & Rehabilitation

## 2015-11-27 ENCOUNTER — Encounter: Payer: Medicare Other | Attending: Physical Medicine & Rehabilitation | Admitting: Physical Medicine & Rehabilitation

## 2015-11-27 VITALS — BP 130/77 | HR 68 | Resp 12

## 2015-11-27 DIAGNOSIS — Z79899 Other long term (current) drug therapy: Secondary | ICD-10-CM | POA: Diagnosis not present

## 2015-11-27 DIAGNOSIS — S069X5S Unspecified intracranial injury with loss of consciousness greater than 24 hours with return to pre-existing conscious level, sequela: Secondary | ICD-10-CM | POA: Diagnosis not present

## 2015-11-27 DIAGNOSIS — G44309 Post-traumatic headache, unspecified, not intractable: Secondary | ICD-10-CM

## 2015-11-27 DIAGNOSIS — R5383 Other fatigue: Secondary | ICD-10-CM | POA: Insufficient documentation

## 2015-11-27 DIAGNOSIS — X749XXS Intentional self-harm by unspecified firearm discharge, sequela: Secondary | ICD-10-CM | POA: Diagnosis not present

## 2015-11-27 DIAGNOSIS — D62 Acute posthemorrhagic anemia: Secondary | ICD-10-CM | POA: Insufficient documentation

## 2015-11-27 DIAGNOSIS — H547 Unspecified visual loss: Secondary | ICD-10-CM | POA: Diagnosis not present

## 2015-11-27 DIAGNOSIS — G8110 Spastic hemiplegia affecting unspecified side: Secondary | ICD-10-CM

## 2015-11-27 DIAGNOSIS — Z87891 Personal history of nicotine dependence: Secondary | ICD-10-CM | POA: Insufficient documentation

## 2015-11-27 DIAGNOSIS — F329 Major depressive disorder, single episode, unspecified: Secondary | ICD-10-CM | POA: Diagnosis not present

## 2015-11-27 DIAGNOSIS — R51 Headache: Secondary | ICD-10-CM | POA: Diagnosis not present

## 2015-11-27 MED ORDER — METHYLPHENIDATE HCL 10 MG PO TABS: 10.0000 mg | ORAL_TABLET | Freq: Two times a day (BID) | ORAL | Status: DC

## 2015-11-27 NOTE — Patient Instructions (Signed)
  PLEASE CALL ME WITH ANY PROBLEMS OR QUESTIONS (#[phone]).      
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
## 2016-01-14 ENCOUNTER — Telehealth: Payer: Self-pay | Admitting: Physical Medicine & Rehabilitation

## 2016-01-14 NOTE — Telephone Encounter (Signed)
Patient has questions about his Ritalin.  Last time he bought it was around $50 and now it is $72.  Please call him at [phone].

## 2016-02-21 ENCOUNTER — Telehealth: Payer: Self-pay | Admitting: Physical Medicine & Rehabilitation

## 2016-02-21 DIAGNOSIS — G44309 Post-traumatic headache, unspecified, not intractable: Secondary | ICD-10-CM

## 2016-02-21 DIAGNOSIS — S069X5S Unspecified intracranial injury with loss of consciousness greater than 24 hours with return to pre-existing conscious level, sequela: Secondary | ICD-10-CM

## 2016-02-21 NOTE — Telephone Encounter (Signed)
Patient needs a refill on Ritalin.

## 2016-02-24 MED ORDER — METHYLPHENIDATE HCL 10 MG PO TABS: 10.0000 mg | ORAL_TABLET | Freq: Two times a day (BID) | ORAL | 0 refills | Status: DC

## 2016-02-24 NOTE — Telephone Encounter (Signed)
Refills written

## 2016-02-25 NOTE — Telephone Encounter (Signed)
Spoke to patient's mother informing that they may come by and pick up his scripts

## 2016-03-17 ENCOUNTER — Telehealth: Payer: Self-pay | Admitting: Physical Medicine & Rehabilitation

## 2016-03-17 DIAGNOSIS — F32A Depression, unspecified: Secondary | ICD-10-CM

## 2016-03-17 DIAGNOSIS — F329 Major depressive disorder, single episode, unspecified: Secondary | ICD-10-CM

## 2016-03-17 NOTE — Telephone Encounter (Signed)
Patient needs a refill on Celexa.  Please call patient when this is done.

## 2016-03-18 ENCOUNTER — Telehealth: Payer: Self-pay | Admitting: Physical Medicine & Rehabilitation

## 2016-03-18 MED ORDER — CITALOPRAM HYDROBROMIDE 40 MG PO TABS: 40.0000 mg | ORAL_TABLET | Freq: Every day | ORAL | 3 refills | Status: DC

## 2016-03-18 NOTE — Telephone Encounter (Signed)
Patient is calling back about his refill on Celexa.  He left a message yesterday.

## 2016-03-18 NOTE — Telephone Encounter (Signed)
Refilled, pt notified

## 2016-03-25 ENCOUNTER — Encounter: Payer: Self-pay | Admitting: Physical Medicine & Rehabilitation

## 2016-03-25 ENCOUNTER — Encounter: Payer: Medicare Other | Attending: Physical Medicine & Rehabilitation | Admitting: Physical Medicine & Rehabilitation

## 2016-03-25 VITALS — BP 130/80 | HR 65 | Resp 16

## 2016-03-25 DIAGNOSIS — F329 Major depressive disorder, single episode, unspecified: Secondary | ICD-10-CM | POA: Insufficient documentation

## 2016-03-25 DIAGNOSIS — D62 Acute posthemorrhagic anemia: Secondary | ICD-10-CM | POA: Diagnosis not present

## 2016-03-25 DIAGNOSIS — S0193XD Puncture wound without foreign body of unspecified part of head, subsequent encounter: Secondary | ICD-10-CM | POA: Diagnosis not present

## 2016-03-25 DIAGNOSIS — Z9889 Other specified postprocedural states: Secondary | ICD-10-CM | POA: Insufficient documentation

## 2016-03-25 DIAGNOSIS — S069X5S Unspecified intracranial injury with loss of consciousness greater than 24 hours with return to pre-existing conscious level, sequela: Secondary | ICD-10-CM

## 2016-03-25 DIAGNOSIS — G8110 Spastic hemiplegia affecting unspecified side: Secondary | ICD-10-CM | POA: Diagnosis not present

## 2016-03-25 DIAGNOSIS — M25569 Pain in unspecified knee: Secondary | ICD-10-CM | POA: Insufficient documentation

## 2016-03-25 DIAGNOSIS — Q688 Other specified congenital musculoskeletal deformities: Secondary | ICD-10-CM | POA: Insufficient documentation

## 2016-03-25 DIAGNOSIS — Z87891 Personal history of nicotine dependence: Secondary | ICD-10-CM | POA: Insufficient documentation

## 2016-03-25 DIAGNOSIS — G44309 Post-traumatic headache, unspecified, not intractable: Secondary | ICD-10-CM | POA: Insufficient documentation

## 2016-03-25 DIAGNOSIS — R531 Weakness: Secondary | ICD-10-CM | POA: Insufficient documentation

## 2016-03-25 DIAGNOSIS — H547 Unspecified visual loss: Secondary | ICD-10-CM | POA: Diagnosis not present

## 2016-03-25 DIAGNOSIS — X58XXXD Exposure to other specified factors, subsequent encounter: Secondary | ICD-10-CM | POA: Insufficient documentation

## 2016-03-25 MED ORDER — METHYLPHENIDATE HCL 10 MG PO TABS: 10.0000 mg | ORAL_TABLET | Freq: Two times a day (BID) | ORAL | 0 refills | Status: DC
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
## 2016-03-25 NOTE — Patient Instructions (Signed)
TRY TO SET A SCHEDULED TIME TO STUDY IN YOUR WEEKLY ROUTINE   GREAT JOB!!!

## 2016-04-29 ENCOUNTER — Ambulatory Visit (INDEPENDENT_AMBULATORY_CARE_PROVIDER_SITE_OTHER): Payer: Medicare Other

## 2016-04-29 VITALS — BP 122/80 | HR 57 | Ht 72.0 in | Wt 256.5 lb

## 2016-04-29 DIAGNOSIS — Z0000 Encounter for general adult medical examination without abnormal findings: Secondary | ICD-10-CM | POA: Diagnosis not present

## 2016-04-29 NOTE — Patient Instructions (Addendum)
Mark [name] , Thank you for taking time to come for your Medicare Wellness Visit. I appreciate your ongoing commitment to your health goals. Please review the following plan we discussed and let me know if I can assist you in the future.   Will check with Lorre Nick or other about stroke or brain injury [facility] or other  Adult driver training   Will schedule eye exam;  Dr. Larena Glassman   Documented flu shot   These are the goals we discussed: Goals    . patient          Work and attend classes for GED; possibly go to [employer]        This is a list of the screening recommended for you and due dates:  Health Maintenance  Topic Date Due  . HIV Screening  01/18/1982  . Flu Shot  01/28/2016  . Tetanus Vaccine  12/22/2022               Fall Prevention in the Home  Falls can cause injuries. They can happen to people of all ages. There are many things you can do to make your home safe and to help prevent falls.  WHAT CAN I DO ON THE OUTSIDE OF MY HOME?  Regularly fix the edges of walkways and driveways and fix any cracks.  Remove anything that might make you trip as you walk through a door, such as a raised step or threshold.  Trim any bushes or trees on the path to your home.  Use bright outdoor lighting.  Clear any walking paths of anything that might make someone trip, such as rocks or tools.  Regularly check to see if handrails are loose or broken. Make sure that both sides of any steps have handrails.  Any raised decks and porches should have guardrails on the edges.  Have any leaves, snow, or ice cleared regularly.  Use sand or salt on walking paths during winter.  Clean up any spills in your garage right away. This includes oil or grease spills. WHAT CAN I DO IN THE BATHROOM?   Use night lights.  Install grab bars by the toilet and in the tub and shower. Do not use towel bars as grab bars.  Use non-skid mats or decals in the  tub or shower.  If you need to sit down in the shower, use a plastic, non-slip stool.  Keep the floor dry. Clean up any water that spills on the floor as soon as it happens.  Remove soap buildup in the tub or shower regularly.  Attach bath mats securely with double-sided non-slip rug tape.  Do not have throw rugs and other things on the floor that can make you trip. WHAT CAN I DO IN THE BEDROOM?  Use night lights.  Make sure that you have a light by your bed that is easy to reach.  Do not use any sheets or blankets that are too big for your bed. They should not hang down onto the floor.  Have a firm chair that has side arms. You can use this for support while you get dressed.  Do not have throw rugs and other things on the floor that can make you trip. WHAT CAN I DO IN THE KITCHEN?  Clean up any spills right away.  Avoid walking on wet floors.  Keep items that you use a lot in easy-to-reach places.  If you need  to reach something above you, use a strong step stool that has a grab bar.  Keep electrical cords out of the way.  Do not use floor polish or wax that makes floors slippery. If you must use wax, use non-skid floor wax.  Do not have throw rugs and other things on the floor that can make you trip. WHAT CAN I DO WITH MY STAIRS?  Do not leave any items on the stairs.  Make sure that there are handrails on both sides of the stairs and use them. Fix handrails that are broken or loose. Make sure that handrails are as long as the stairways.  Check any carpeting to make sure that it is firmly attached to the stairs. Fix any carpet that is loose or worn.  Avoid having throw rugs at the top or bottom of the stairs. If you do have throw rugs, attach them to the floor with carpet tape.  Make sure that you have a light switch at the top of the stairs and the bottom of the stairs. If you do not have them, ask someone to add them for you. WHAT ELSE CAN I DO TO HELP PREVENT  FALLS?  Wear shoes that:  Do not have high heels.  Have rubber bottoms.  Are comfortable and fit you well.  Are closed at the toe. Do not wear sandals.  If you use a stepladder:  Make sure that it is fully opened. Do not climb a closed stepladder.  Make sure that both sides of the stepladder are locked into place.  Ask someone to hold it for you, if possible.  Clearly mark and make sure that you can see:  Any grab bars or handrails.  First and last steps.  Where the edge of each step is.  Use tools that help you move around (mobility aids) if they are needed. These include:  Canes.  Walkers.  Scooters.  Crutches.  Turn on the lights when you go into a dark area. Replace any light bulbs as soon as they burn out.  Set up your furniture so you have a clear path. Avoid moving your furniture around.  If any of your floors are uneven, fix them.  If there are any pets around you, be aware of where they are.  Review your medicines with your doctor. Some medicines can make you feel dizzy. This can increase your chance of falling. Ask your doctor what other things that you can do to help prevent falls.   This information is not intended to replace advice given to you by your health care provider. Make sure you discuss any questions you have with your health care provider.   Document Released: 04/11/2009 Document Revised: 10/30/2014 Document Reviewed: 07/20/2014 Elsevier Interactive Patient Education 2016 [place] Maintenance, Male A healthy lifestyle and preventative care can promote health and wellness.  Maintain regular health, dental, and eye exams.  Eat a healthy diet. Foods like vegetables, fruits, whole grains, low-fat dairy products, and lean protein foods contain the nutrients you need and are low in calories. Decrease your intake of foods high in solid fats, added sugars, and salt. Get information about a proper diet from your health care  provider, if necessary.  Regular physical exercise is one of the most important things you can do for your health. Most adults should get at least 150 minutes of moderate-intensity exercise (any activity that increases your heart rate and causes you to sweat) each week. In addition, most  adults need muscle-strengthening exercises on 2 or more days a week.   Maintain a healthy weight. The body mass index (BMI) is a screening tool to identify possible weight problems. It provides an estimate of body fat based on height and weight. Your health care provider can find your BMI and can help you achieve or maintain a healthy weight. For males 20 years and older:  A BMI below 18.5 is considered underweight.  A BMI of 18.5 to 24.9 is normal.  A BMI of 25 to 29.9 is considered overweight.  A BMI of 30 and above is considered obese.  Maintain normal blood lipids and cholesterol by exercising and minimizing your intake of saturated fat. Eat a balanced diet with plenty of fruits and vegetables. Blood tests for lipids and cholesterol should begin at age 66 and be repeated every 5 years. If your lipid or cholesterol levels are high, you are over age 69, or you are at high risk for heart disease, you may need your cholesterol levels checked more frequently.Ongoing high lipid and cholesterol levels should be treated with medicines if diet and exercise are not working.  If you smoke, find out from your health care provider how to quit. If you do not use tobacco, do not start.  Lung cancer screening is recommended for adults aged 29-80 years who are at high risk for developing lung cancer because of a history of smoking. A yearly low-dose CT scan of the lungs is recommended for people who have at least a 30-pack-year history of smoking and are current smokers or have quit within the past 15 years. A pack year of smoking is smoking an average of 1 pack of cigarettes a day for 1 year (for example, a 30-pack-year  history of smoking could mean smoking 1 pack a day for 30 years or 2 packs a day for 15 years). Yearly screening should continue until the smoker has stopped smoking for at least 15 years. Yearly screening should be stopped for people who develop a health problem that would prevent them from having lung cancer treatment.  If you choose to drink alcohol, do not have more than 2 drinks per day. One drink is considered to be 12 oz (360 mL) of beer, 5 oz (150 mL) of wine, or 1.5 oz (45 mL) of liquor.  Avoid the use of street drugs. Do not share needles with anyone. Ask for help if you need support or instructions about stopping the use of drugs.  High blood pressure causes heart disease and increases the risk of stroke. High blood pressure is more likely to develop in:  People who have blood pressure in the end of the normal range (100-139/85-89 mm Hg).  People who are overweight or obese.  People who are African American.  If you are 23-36 years of age, have your blood pressure checked every 3-5 years. If you are 60 years of age or older, have your blood pressure checked every year. You should have your blood pressure measured twice--once when you are at a hospital or clinic, and once when you are not at a hospital or clinic. Record the average of the two measurements. To check your blood pressure when you are not at a hospital or clinic, you can use:  An automated blood pressure machine at a pharmacy.  A home blood pressure monitor.  If you are 24-64 years old, ask your health care provider if you should take aspirin to prevent heart disease.  Diabetes screening  involves taking a blood sample to check your fasting blood sugar level. This should be done once every 3 years after age 71 if you are at a normal weight and without risk factors for diabetes. Testing should be considered at a younger age or be carried out more frequently if you are overweight and have at least 1 risk factor for  diabetes.  Colorectal cancer can be detected and often prevented. Most routine colorectal cancer screening begins at the age of 47 and continues through age 10. However, your health care provider may recommend screening at an earlier age if you have risk factors for colon cancer. On a yearly basis, your health care provider may provide home test kits to check for hidden blood in the stool. A small camera at the end of a tube may be used to directly examine the colon (sigmoidoscopy or colonoscopy) to detect the earliest forms of colorectal cancer. Talk to your health care provider about this at age 94 when routine screening begins. A direct exam of the colon should be repeated every 5-10 years through age 50, unless early forms of precancerous polyps or small growths are found.  People who are at an increased risk for hepatitis B should be screened for this virus. You are considered at high risk for hepatitis B if:  You were born in a country where hepatitis B occurs often. Talk with your health care provider about which countries are considered high risk.  Your parents were born in a high-risk country and you have not received a shot to protect against hepatitis B (hepatitis B vaccine).  You have HIV or AIDS.  You use needles to inject street drugs.  You live with, or have sex with, someone who has hepatitis B.  You are a man who has sex with other men (MSM).  You get hemodialysis treatment.  You take certain medicines for conditions like cancer, organ transplantation, and autoimmune conditions.  Hepatitis C blood testing is recommended for all people born from [birth year] through 1964 and any individual with known risk factors for hepatitis C.  Healthy men should no longer receive prostate-specific antigen (PSA) blood tests as part of routine cancer screening. Talk to your health care provider about prostate cancer screening.  Testicular cancer screening is not recommended for adolescents or  adult males who have no symptoms. Screening includes self-exam, a health care provider exam, and other screening tests. Consult with your health care provider about any symptoms you have or any concerns you have about testicular cancer.  Practice safe sex. Use condoms and avoid high-risk sexual practices to reduce the spread of sexually transmitted infections (STIs).  You should be screened for STIs, including gonorrhea and chlamydia if:  You are sexually active and are younger than 24 years.  You are older than 24 years, and your health care provider tells you that you are at risk for this type of infection.  Your sexual activity has changed since you were last screened, and you are at an increased risk for chlamydia or gonorrhea. Ask your health care provider if you are at risk.  If you are at risk of being infected with HIV, it is recommended that you take a prescription medicine daily to prevent HIV infection. This is called pre-exposure prophylaxis (PrEP). You are considered at risk if:  You are a man who has sex with other men (MSM).  You are a heterosexual man who is sexually active with multiple partners.  You take drugs by  injection.  You are sexually active with a partner who has HIV.  Talk with your health care provider about whether you are at high risk of being infected with HIV. If you choose to begin PrEP, you should first be tested for HIV. You should then be tested every 3 months for as long as you are taking PrEP.  Use sunscreen. Apply sunscreen liberally and repeatedly throughout the day. You should seek shade when your shadow is shorter than you. Protect yourself by wearing long sleeves, pants, a wide-brimmed hat, and sunglasses year round whenever you are outdoors.  Tell your health care provider of new moles or changes in moles, especially if there is a change in shape or color. Also, tell your health care provider if a mole is larger than the size of a pencil  eraser.  A one-time screening for abdominal aortic aneurysm (AAA) and surgical repair of large AAAs by ultrasound is recommended for men aged 72-75 years who are current or former smokers.  Stay current with your vaccines (immunizations).   This information is not intended to replace advice given to you by your health care provider. Make sure you discuss any questions you have with your health care provider.   Document Released: 12/12/2007 Document Revised: 07/06/2014 Document Reviewed: 11/10/2010 Elsevier Interactive Patient Education [employer].
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
## 2016-05-06 ENCOUNTER — Encounter: Payer: Self-pay | Admitting: Internal Medicine

## 2016-05-06 ENCOUNTER — Other Ambulatory Visit (INDEPENDENT_AMBULATORY_CARE_PROVIDER_SITE_OTHER): Payer: Medicare Other

## 2016-05-06 ENCOUNTER — Ambulatory Visit (INDEPENDENT_AMBULATORY_CARE_PROVIDER_SITE_OTHER): Payer: Medicare Other | Admitting: Internal Medicine

## 2016-05-06 VITALS — BP 118/68 | HR 66 | Temp 98.5°F | Resp 16 | Ht 72.0 in | Wt 254.0 lb

## 2016-05-06 DIAGNOSIS — N400 Enlarged prostate without lower urinary tract symptoms: Secondary | ICD-10-CM | POA: Diagnosis not present

## 2016-05-06 DIAGNOSIS — R739 Hyperglycemia, unspecified: Secondary | ICD-10-CM

## 2016-05-06 DIAGNOSIS — K921 Melena: Secondary | ICD-10-CM | POA: Diagnosis not present

## 2016-05-06 DIAGNOSIS — Z0000 Encounter for general adult medical examination without abnormal findings: Secondary | ICD-10-CM

## 2016-05-06 DIAGNOSIS — E785 Hyperlipidemia, unspecified: Secondary | ICD-10-CM | POA: Diagnosis not present

## 2016-05-06 DIAGNOSIS — R3121 Asymptomatic microscopic hematuria: Secondary | ICD-10-CM

## 2016-05-06 LAB — URINALYSIS, ROUTINE W REFLEX MICROSCOPIC
BILIRUBIN URINE: NEGATIVE
Ketones, ur: NEGATIVE
Leukocytes, UA: NEGATIVE
Nitrite: NEGATIVE
Specific Gravity, Urine: >=1.03 — AB (ref 1.000–1.030)
UROBILINOGEN UA: 0.2 (ref 0.0–1.0)
Urine Glucose: NEGATIVE
pH: 5.5 (ref 5.0–8.0)

## 2016-05-06 LAB — CBC WITH DIFFERENTIAL/PLATELET
BASOS PCT: 0.8 % (ref 0.0–3.0)
Basophils Absolute: 0 10*3/uL (ref 0.0–0.1)
EOS ABS: 0.1 10*3/uL (ref 0.0–0.7)
EOS PCT: 2.6 % (ref 0.0–5.0)
HCT: 44.1 % (ref 39.0–52.0)
HEMOGLOBIN: 14.3 g/dL (ref 13.0–17.0)
LYMPHS ABS: 2 10*3/uL (ref 0.7–4.0)
Lymphocytes Relative: 50.3 % — ABNORMAL HIGH (ref 12.0–46.0)
MCHC: 32.5 g/dL (ref 30.0–36.0)
MCV: 86 fl (ref 78.0–100.0)
MONO ABS: 0.3 10*3/uL (ref 0.1–1.0)
Monocytes Relative: 6.6 % (ref 3.0–12.0)
NEUTROS ABS: 1.6 10*3/uL (ref 1.4–7.7)
NEUTROS PCT: 39.7 % — AB (ref 43.0–77.0)
PLATELETS: 147 10*3/uL — AB (ref 150.0–400.0)
RBC: 5.13 Mil/uL (ref 4.22–5.81)
RDW: 14.5 % (ref 11.5–15.5)
WBC: 4.1 10*3/uL (ref 4.0–10.5)

## 2016-05-06 LAB — COMPREHENSIVE METABOLIC PANEL
ALT: 11 U/L (ref 0–53)
AST: 9 U/L (ref 0–37)
Albumin: 3.8 g/dL (ref 3.5–5.2)
Alkaline Phosphatase: 44 U/L (ref 39–117)
BILIRUBIN TOTAL: 0.5 mg/dL (ref 0.2–1.2)
BUN: 16 mg/dL (ref 6–23)
CALCIUM: 9 mg/dL (ref 8.4–10.5)
CHLORIDE: 108 meq/L (ref 96–112)
CO2: 28 meq/L (ref 19–32)
Creatinine, Ser: 0.93 mg/dL (ref 0.40–1.50)
GFR: 110.92 mL/min (ref >60.00)
Glucose, Bld: 86 mg/dL (ref 70–99)
POTASSIUM: 4.5 meq/L (ref 3.5–5.1)
Sodium: 141 mEq/L (ref 135–145)
Total Protein: 6.7 g/dL (ref 6.0–8.3)

## 2016-05-06 LAB — LIPID PANEL
CHOL/HDL RATIO: 4
Cholesterol: 160 mg/dL (ref 0–200)
HDL: 41.3 mg/dL (ref >39.00)
LDL CALC: 111 mg/dL — AB (ref 0–99)
NONHDL: 118.73
TRIGLYCERIDES: 40 mg/dL (ref 0.0–149.0)
VLDL: 8 mg/dL (ref 0.0–40.0)

## 2016-05-06 LAB — HEMOGLOBIN A1C: HEMOGLOBIN A1C: 5.5 % (ref 4.6–6.5)

## 2016-05-06 LAB — PSA: PSA: 0.91 ng/mL (ref 0.10–4.00)

## 2016-05-06 LAB — TSH: TSH: 0.95 u[IU]/mL (ref 0.35–4.50)

## 2016-05-06 NOTE — Progress Notes (Signed)
Pre visit review using our clinic review tool, if applicable. No additional management support is needed unless otherwise documented below in the visit note. 
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
## 2016-05-06 NOTE — Patient Instructions (Signed)

## 2016-05-10 NOTE — Assessment & Plan Note (Signed)

## 2016-05-12 ENCOUNTER — Encounter: Payer: Self-pay | Admitting: Physician Assistant

## 2016-05-14 ENCOUNTER — Ambulatory Visit (INDEPENDENT_AMBULATORY_CARE_PROVIDER_SITE_OTHER)
Admission: RE | Admit: 2016-05-14 | Discharge: 2016-05-14 | Disposition: A | Discharge status: Home / Self Care | Payer: Medicare Other | Source: Ambulatory Visit | Attending: Internal Medicine | Admitting: Internal Medicine

## 2016-05-14 DIAGNOSIS — N200 Calculus of kidney: Secondary | ICD-10-CM | POA: Diagnosis not present

## 2016-05-14 DIAGNOSIS — R3121 Asymptomatic microscopic hematuria: Secondary | ICD-10-CM | POA: Diagnosis not present

## 2016-05-25 ENCOUNTER — Other Ambulatory Visit: Payer: Self-pay | Admitting: Internal Medicine

## 2016-05-25 ENCOUNTER — Encounter (INDEPENDENT_AMBULATORY_CARE_PROVIDER_SITE_OTHER): Payer: Self-pay

## 2016-05-25 ENCOUNTER — Ambulatory Visit (INDEPENDENT_AMBULATORY_CARE_PROVIDER_SITE_OTHER): Payer: Medicare Other | Admitting: Physician Assistant

## 2016-05-25 ENCOUNTER — Encounter: Payer: Self-pay | Admitting: Physician Assistant

## 2016-05-25 VITALS — BP 110/72 | HR 60 | Ht 73.25 in | Wt 250.1 lb

## 2016-05-25 DIAGNOSIS — R195 Other fecal abnormalities: Secondary | ICD-10-CM | POA: Diagnosis not present

## 2016-05-25 DIAGNOSIS — N2889 Other specified disorders of kidney and ureter: Secondary | ICD-10-CM | POA: Insufficient documentation
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
## 2016-05-25 NOTE — Progress Notes (Signed)
Reviewed and agree with initial management plan.  Anisa Leanos T. Tristram Milian, MD FACG 

## 2016-05-25 NOTE — Patient Instructions (Addendum)
  Today we are giving you hemoccult cards to do and mail back to [place].   Follow up with Dr Fuller Plan or Ellouise Newer PA-C as needed.    I appreciate the opportunity to care for you.

## 2016-05-27 ENCOUNTER — Telehealth: Payer: Self-pay | Admitting: Emergency Medicine

## 2016-05-27 ENCOUNTER — Other Ambulatory Visit: Payer: Self-pay | Admitting: Internal Medicine

## 2016-05-27 DIAGNOSIS — N2889 Other specified disorders of kidney and ureter: Secondary | ICD-10-CM

## 2016-05-27 DIAGNOSIS — R3129 Other microscopic hematuria: Secondary | ICD-10-CM | POA: Insufficient documentation

## 2016-05-27 NOTE — Telephone Encounter (Signed)
Can help with this if needed, Mark advise

## 2016-05-27 NOTE — Telephone Encounter (Signed)
He just had a CT that was abnormal The radiologist recommended an MRI I will refer to urology
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
## 2016-05-29 NOTE — Telephone Encounter (Signed)
LVM for pt to call back as soon as possible.   Re: referred to urology

## 2016-06-01 ENCOUNTER — Telehealth: Payer: Self-pay | Admitting: Internal Medicine

## 2016-06-09 ENCOUNTER — Other Ambulatory Visit (INDEPENDENT_AMBULATORY_CARE_PROVIDER_SITE_OTHER): Payer: Medicare Other

## 2016-06-09 DIAGNOSIS — R195 Other fecal abnormalities: Secondary | ICD-10-CM

## 2016-06-09 LAB — HEMOCCULT SLIDES (X 3 CARDS)
FECAL OCCULT BLD: NEGATIVE
OCCULT 1: NEGATIVE
OCCULT 2: NEGATIVE
OCCULT 3: NEGATIVE
OCCULT 4: NEGATIVE
OCCULT 5: NEGATIVE

## 2016-06-17 ENCOUNTER — Telehealth: Payer: Self-pay

## 2016-06-17 NOTE — Telephone Encounter (Signed)
Contacted Mark [name], asked if he could wait until Wednesday, December 27th. He said yes.  I told him Dr. Naaman Plummer would back in clinic that day.  He agreed to call us back that morning.

## 2016-06-17 NOTE — Telephone Encounter (Signed)
Called for a refill on ritalin

## 2016-06-18 ENCOUNTER — Encounter: Payer: Self-pay | Admitting: Internal Medicine

## 2016-06-24 ENCOUNTER — Telehealth: Payer: Self-pay

## 2016-06-24 ENCOUNTER — Telehealth: Payer: Self-pay | Admitting: Physical Medicine & Rehabilitation

## 2016-06-24 ENCOUNTER — Other Ambulatory Visit: Payer: Self-pay

## 2016-06-24 DIAGNOSIS — G44309 Post-traumatic headache, unspecified, not intractable: Secondary | ICD-10-CM

## 2016-06-24 DIAGNOSIS — S069X5S Unspecified intracranial injury with loss of consciousness greater than 24 hours with return to pre-existing conscious level, sequela: Secondary | ICD-10-CM

## 2016-06-24 MED ORDER — METHYLPHENIDATE HCL 10 MG PO TABS: 10.0000 mg | ORAL_TABLET | Freq: Two times a day (BID) | ORAL | 0 refills | Status: DC

## 2016-06-24 NOTE — Telephone Encounter (Signed)
Patient called this am requesting an Rx for his Ritalin and Citalopram (Celexa). Ritalin Rx has been printed and Citalopram (Celexa) was picked up 06/15/16 according to the pharmacists at [facility]. Patients mother has been notified of Rx pick up.

## 2016-06-24 NOTE — Telephone Encounter (Signed)
Mark [name] has addressed this.  Rx has been printed and picked up

## 2016-06-24 NOTE — Telephone Encounter (Signed)
Ptn returned call needs ritalin [phone]

## 2016-06-25 ENCOUNTER — Other Ambulatory Visit: Payer: Self-pay | Admitting: Physical Medicine & Rehabilitation

## 2016-06-25 DIAGNOSIS — F32A Depression, unspecified: Secondary | ICD-10-CM

## 2016-06-25 DIAGNOSIS — F329 Major depressive disorder, single episode, unspecified: Secondary | ICD-10-CM

## 2016-07-22 ENCOUNTER — Encounter: Payer: Self-pay | Admitting: Physical Medicine & Rehabilitation

## 2016-07-22 ENCOUNTER — Encounter: Payer: Medicare Other | Attending: Physical Medicine & Rehabilitation | Admitting: Physical Medicine & Rehabilitation

## 2016-07-22 DIAGNOSIS — F329 Major depressive disorder, single episode, unspecified: Secondary | ICD-10-CM | POA: Insufficient documentation

## 2016-07-22 DIAGNOSIS — H539 Unspecified visual disturbance: Secondary | ICD-10-CM | POA: Diagnosis not present

## 2016-07-22 DIAGNOSIS — D62 Acute posthemorrhagic anemia: Secondary | ICD-10-CM | POA: Insufficient documentation

## 2016-07-22 DIAGNOSIS — S06890D Other specified intracranial injury without loss of consciousness, subsequent encounter: Secondary | ICD-10-CM | POA: Insufficient documentation

## 2016-07-22 DIAGNOSIS — G44309 Post-traumatic headache, unspecified, not intractable: Secondary | ICD-10-CM | POA: Insufficient documentation

## 2016-07-22 DIAGNOSIS — Z87891 Personal history of nicotine dependence: Secondary | ICD-10-CM | POA: Diagnosis not present

## 2016-07-22 DIAGNOSIS — W3400XD Accidental discharge from unspecified firearms or gun, subsequent encounter: Secondary | ICD-10-CM | POA: Diagnosis not present

## 2016-07-22 DIAGNOSIS — S069X5S Unspecified intracranial injury with loss of consciousness greater than 24 hours with return to pre-existing conscious level, sequela: Secondary | ICD-10-CM | POA: Diagnosis not present

## 2016-07-22 DIAGNOSIS — F1020 Alcohol dependence, uncomplicated: Secondary | ICD-10-CM | POA: Insufficient documentation

## 2016-07-22 DIAGNOSIS — G8110 Spastic hemiplegia affecting unspecified side: Secondary | ICD-10-CM | POA: Insufficient documentation

## 2016-07-22 DIAGNOSIS — S93401A Sprain of unspecified ligament of right ankle, initial encounter: Secondary | ICD-10-CM | POA: Diagnosis not present

## 2016-07-22 MED ORDER — METHYLPHENIDATE HCL 10 MG PO TABS: 10.0000 mg | ORAL_TABLET | Freq: Two times a day (BID) | ORAL | 0 refills | Status: DC
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
## 2016-07-22 NOTE — Patient Instructions (Signed)
EXAMINE YOUR DIETARY HABITS, WHAT TYPE OF FOODS YOU'RE EATING BEFORE YOU HAVE A LOOSE STOOL.   IS MOOD PLAYING A FACTOR.    PLEASE FEEL FREE TO CALL OUR OFFICE WITH ANY PROBLEMS OR QUESTIONS [phone])

## 2016-07-31 DIAGNOSIS — R311 Benign essential microscopic hematuria: Secondary | ICD-10-CM | POA: Diagnosis not present

## 2016-07-31 DIAGNOSIS — N400 Enlarged prostate without lower urinary tract symptoms: Secondary | ICD-10-CM | POA: Diagnosis not present

## 2016-07-31 DIAGNOSIS — N200 Calculus of kidney: Secondary | ICD-10-CM | POA: Diagnosis not present

## 2016-07-31 DIAGNOSIS — D49512 Neoplasm of unspecified behavior of left kidney: Secondary | ICD-10-CM | POA: Diagnosis not present

## 2016-08-19 DIAGNOSIS — R311 Benign essential microscopic hematuria: Secondary | ICD-10-CM | POA: Diagnosis not present

## 2016-08-19 DIAGNOSIS — R3129 Other microscopic hematuria: Secondary | ICD-10-CM | POA: Diagnosis not present

## 2016-08-25 ENCOUNTER — Ambulatory Visit: Payer: Medicare Other | Admitting: Physical Medicine & Rehabilitation

## 2016-10-22 ENCOUNTER — Other Ambulatory Visit: Payer: Self-pay | Admitting: Physical Medicine & Rehabilitation

## 2016-10-22 DIAGNOSIS — F329 Major depressive disorder, single episode, unspecified: Secondary | ICD-10-CM

## 2016-10-22 DIAGNOSIS — F32A Depression, unspecified: Secondary | ICD-10-CM

## 2016-10-23 NOTE — Telephone Encounter (Signed)
Recieved electronic medication refill request for citalopram, no mention in previous note as to continue this medication, please advise.

## 2016-11-12 ENCOUNTER — Telehealth: Payer: Self-pay | Admitting: Registered Nurse

## 2016-11-12 DIAGNOSIS — S069X5S Unspecified intracranial injury with loss of consciousness greater than 24 hours with return to pre-existing conscious level, sequela: Secondary | ICD-10-CM

## 2016-11-12 DIAGNOSIS — G44309 Post-traumatic headache, unspecified, not intractable: Secondary | ICD-10-CM

## 2016-11-12 MED ORDER — METHYLPHENIDATE HCL 10 MG PO TABS: 10.0000 mg | ORAL_TABLET | Freq: Two times a day (BID) | ORAL | 0 refills | Status: DC

## 2016-11-12 NOTE — Telephone Encounter (Signed)
Return Mark [name] call, he states he has ran out of his Ritalin.  According to [facility] last prescription was picked up on 03/ 22/2018. Called his pharmacy [facility], they also verified the above. Prescription Printed. Mark [name] will pick up his prescription today, also instructed to keep his appointment with Dr. Naaman Plummer. He verbalizes understanding.

## 2016-11-18 ENCOUNTER — Encounter: Payer: Medicare Other | Attending: Physical Medicine & Rehabilitation | Admitting: Physical Medicine & Rehabilitation

## 2016-11-18 ENCOUNTER — Encounter: Payer: Self-pay | Admitting: Physical Medicine & Rehabilitation

## 2016-11-18 VITALS — BP 130/58 | HR 58

## 2016-11-18 DIAGNOSIS — F329 Major depressive disorder, single episode, unspecified: Secondary | ICD-10-CM | POA: Insufficient documentation

## 2016-11-18 DIAGNOSIS — Z87891 Personal history of nicotine dependence: Secondary | ICD-10-CM | POA: Diagnosis not present

## 2016-11-18 DIAGNOSIS — G8110 Spastic hemiplegia affecting unspecified side: Secondary | ICD-10-CM | POA: Diagnosis not present

## 2016-11-18 DIAGNOSIS — G44309 Post-traumatic headache, unspecified, not intractable: Secondary | ICD-10-CM | POA: Diagnosis not present

## 2016-11-18 DIAGNOSIS — J3489 Other specified disorders of nose and nasal sinuses: Secondary | ICD-10-CM | POA: Insufficient documentation

## 2016-11-18 DIAGNOSIS — H547 Unspecified visual loss: Secondary | ICD-10-CM | POA: Insufficient documentation

## 2016-11-18 DIAGNOSIS — Z5181 Encounter for therapeutic drug level monitoring: Secondary | ICD-10-CM

## 2016-11-18 DIAGNOSIS — Z79899 Other long term (current) drug therapy: Secondary | ICD-10-CM

## 2016-11-18 DIAGNOSIS — D62 Acute posthemorrhagic anemia: Secondary | ICD-10-CM | POA: Insufficient documentation

## 2016-11-18 DIAGNOSIS — M25569 Pain in unspecified knee: Secondary | ICD-10-CM | POA: Diagnosis not present

## 2016-11-18 DIAGNOSIS — S069X5S Unspecified intracranial injury with loss of consciousness greater than 24 hours with return to pre-existing conscious level, sequela: Secondary | ICD-10-CM

## 2016-11-18 DIAGNOSIS — Z87820 Personal history of traumatic brain injury: Secondary | ICD-10-CM | POA: Diagnosis not present

## 2016-11-18 MED ORDER — METHYLPHENIDATE HCL 10 MG PO TABS: 15.0000 mg | ORAL_TABLET | Freq: Two times a day (BID) | ORAL | 0 refills | Status: DC
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
## 2016-11-18 NOTE — Patient Instructions (Signed)
PLEASE FEEL FREE TO CALL OUR OFFICE WITH ANY PROBLEMS OR QUESTIONS ([phone])      

## 2016-11-19 ENCOUNTER — Other Ambulatory Visit: Payer: Self-pay | Admitting: Physical Medicine & Rehabilitation

## 2016-11-19 DIAGNOSIS — F329 Major depressive disorder, single episode, unspecified: Secondary | ICD-10-CM

## 2016-11-19 DIAGNOSIS — F32A Depression, unspecified: Secondary | ICD-10-CM

## 2016-11-22 LAB — TOXASSURE SELECT,+ANTIDEPR,UR

## 2016-11-24 ENCOUNTER — Telehealth: Payer: Self-pay | Admitting: *Deleted

## 2016-11-24 NOTE — Telephone Encounter (Signed)
Urine drug screen for this encounter is consistent for prescribed medication 

## 2017-03-24 ENCOUNTER — Encounter: Payer: Self-pay | Admitting: Physical Medicine & Rehabilitation

## 2017-03-24 ENCOUNTER — Encounter: Payer: Medicare Other | Attending: Physical Medicine & Rehabilitation | Admitting: Physical Medicine & Rehabilitation

## 2017-03-24 ENCOUNTER — Encounter (INDEPENDENT_AMBULATORY_CARE_PROVIDER_SITE_OTHER): Payer: Self-pay

## 2017-03-24 VITALS — BP 128/81 | HR 58

## 2017-03-24 DIAGNOSIS — Z87891 Personal history of nicotine dependence: Secondary | ICD-10-CM | POA: Diagnosis not present

## 2017-03-24 DIAGNOSIS — F1020 Alcohol dependence, uncomplicated: Secondary | ICD-10-CM | POA: Insufficient documentation

## 2017-03-24 DIAGNOSIS — D62 Acute posthemorrhagic anemia: Secondary | ICD-10-CM | POA: Insufficient documentation

## 2017-03-24 DIAGNOSIS — G8110 Spastic hemiplegia affecting unspecified side: Secondary | ICD-10-CM | POA: Diagnosis not present

## 2017-03-24 DIAGNOSIS — H547 Unspecified visual loss: Secondary | ICD-10-CM | POA: Insufficient documentation

## 2017-03-24 DIAGNOSIS — F329 Major depressive disorder, single episode, unspecified: Secondary | ICD-10-CM | POA: Diagnosis not present

## 2017-03-24 DIAGNOSIS — S069X5S Unspecified intracranial injury with loss of consciousness greater than 24 hours with return to pre-existing conscious level, sequela: Secondary | ICD-10-CM | POA: Diagnosis not present

## 2017-03-24 DIAGNOSIS — S06890D Other specified intracranial injury without loss of consciousness, subsequent encounter: Secondary | ICD-10-CM | POA: Insufficient documentation

## 2017-03-24 DIAGNOSIS — S06890S Other specified intracranial injury without loss of consciousness, sequela: Secondary | ICD-10-CM | POA: Diagnosis present

## 2017-03-24 DIAGNOSIS — W3400XD Accidental discharge from unspecified firearms or gun, subsequent encounter: Secondary | ICD-10-CM | POA: Insufficient documentation

## 2017-03-24 DIAGNOSIS — G44309 Post-traumatic headache, unspecified, not intractable: Secondary | ICD-10-CM | POA: Diagnosis not present

## 2017-03-24 MED ORDER — METHYLPHENIDATE HCL 10 MG PO TABS: 15.0000 mg | ORAL_TABLET | Freq: Two times a day (BID) | ORAL | 0 refills | Status: DC

## 2017-03-24 NOTE — Patient Instructions (Signed)
PLEASE FEEL FREE TO CALL OUR OFFICE WITH ANY PROBLEMS OR QUESTIONS ([phone])      
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
## 2017-05-19 ENCOUNTER — Encounter: Payer: Self-pay | Admitting: Registered Nurse

## 2017-05-19 ENCOUNTER — Encounter: Payer: Medicare Other | Attending: Physical Medicine & Rehabilitation | Admitting: Registered Nurse

## 2017-05-19 ENCOUNTER — Other Ambulatory Visit: Payer: Self-pay

## 2017-05-19 ENCOUNTER — Telehealth: Payer: Self-pay | Admitting: Registered Nurse

## 2017-05-19 VITALS — BP 124/80 | HR 67

## 2017-05-19 DIAGNOSIS — F329 Major depressive disorder, single episode, unspecified: Secondary | ICD-10-CM | POA: Diagnosis not present

## 2017-05-19 DIAGNOSIS — G8110 Spastic hemiplegia affecting unspecified side: Secondary | ICD-10-CM | POA: Diagnosis not present

## 2017-05-19 DIAGNOSIS — G44309 Post-traumatic headache, unspecified, not intractable: Secondary | ICD-10-CM | POA: Diagnosis not present

## 2017-05-19 DIAGNOSIS — Z79899 Other long term (current) drug therapy: Secondary | ICD-10-CM

## 2017-05-19 DIAGNOSIS — Z87891 Personal history of nicotine dependence: Secondary | ICD-10-CM | POA: Insufficient documentation

## 2017-05-19 DIAGNOSIS — D62 Acute posthemorrhagic anemia: Secondary | ICD-10-CM | POA: Insufficient documentation

## 2017-05-19 DIAGNOSIS — S06890D Other specified intracranial injury without loss of consciousness, subsequent encounter: Secondary | ICD-10-CM | POA: Insufficient documentation

## 2017-05-19 DIAGNOSIS — Z5181 Encounter for therapeutic drug level monitoring: Secondary | ICD-10-CM

## 2017-05-19 DIAGNOSIS — F1020 Alcohol dependence, uncomplicated: Secondary | ICD-10-CM | POA: Insufficient documentation

## 2017-05-19 DIAGNOSIS — S069X5S Unspecified intracranial injury with loss of consciousness greater than 24 hours with return to pre-existing conscious level, sequela: Secondary | ICD-10-CM

## 2017-05-19 DIAGNOSIS — H547 Unspecified visual loss: Secondary | ICD-10-CM | POA: Insufficient documentation

## 2017-05-19 DIAGNOSIS — S06890S Other specified intracranial injury without loss of consciousness, sequela: Secondary | ICD-10-CM | POA: Diagnosis present

## 2017-05-19 MED ORDER — METHYLPHENIDATE HCL 10 MG PO TABS: 15.0000 mg | ORAL_TABLET | Freq: Two times a day (BID) | ORAL | 0 refills | Status: DC

## 2017-05-19 NOTE — Telephone Encounter (Signed)
On 05/19/2017 the  [place] was reviewed no conflict was seen on the [place] with multiple prescribers. Mark [name] has a signed narcotic contract with our office. If there were any discrepancies this would have been reported to his physician.
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
## 2017-06-15 ENCOUNTER — Ambulatory Visit: Payer: Medicare Other | Admitting: Internal Medicine

## 2017-06-15 ENCOUNTER — Ambulatory Visit (INDEPENDENT_AMBULATORY_CARE_PROVIDER_SITE_OTHER)
Admission: RE | Admit: 2017-06-15 | Discharge: 2017-06-15 | Disposition: A | Discharge status: Home / Self Care | Payer: Medicare Other | Source: Ambulatory Visit | Attending: Internal Medicine | Admitting: Internal Medicine

## 2017-06-15 ENCOUNTER — Ambulatory Visit (INDEPENDENT_AMBULATORY_CARE_PROVIDER_SITE_OTHER): Payer: Medicare Other | Admitting: Internal Medicine

## 2017-06-15 ENCOUNTER — Encounter: Payer: Self-pay | Admitting: Internal Medicine

## 2017-06-15 VITALS — BP 128/80 | HR 65 | Temp 98.8°F | Resp 16 | Ht 73.25 in | Wt 270.8 lb

## 2017-06-15 DIAGNOSIS — R51 Headache: Secondary | ICD-10-CM

## 2017-06-15 DIAGNOSIS — G8110 Spastic hemiplegia affecting unspecified side: Secondary | ICD-10-CM

## 2017-06-15 DIAGNOSIS — S069X5S Unspecified intracranial injury with loss of consciousness greater than 24 hours with return to pre-existing conscious level, sequela: Secondary | ICD-10-CM

## 2017-06-15 DIAGNOSIS — J0100 Acute maxillary sinusitis, unspecified: Secondary | ICD-10-CM | POA: Diagnosis not present

## 2017-06-15 DIAGNOSIS — R519 Headache, unspecified: Secondary | ICD-10-CM

## 2017-06-15 MED ORDER — AMOXICILLIN-POT CLAVULANATE 875-125 MG PO TABS: 1.0000 | ORAL_TABLET | Freq: Two times a day (BID) | ORAL | 0 refills | Status: AC
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
## 2017-06-16 ENCOUNTER — Telehealth: Payer: Self-pay

## 2017-06-16 DIAGNOSIS — R195 Other fecal abnormalities: Secondary | ICD-10-CM

## 2017-06-16 NOTE — Telephone Encounter (Signed)
Order [id number]

## 2017-06-17 ENCOUNTER — Encounter: Payer: Self-pay | Admitting: Internal Medicine

## 2017-06-17 NOTE — Patient Instructions (Signed)
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
## 2017-07-07 ENCOUNTER — Ambulatory Visit (INDEPENDENT_AMBULATORY_CARE_PROVIDER_SITE_OTHER): Payer: Medicare Other | Admitting: *Deleted

## 2017-07-07 VITALS — BP 134/82 | HR 72 | Resp 17 | Ht 73.0 in | Wt 275.0 lb

## 2017-07-07 DIAGNOSIS — Z0000 Encounter for general adult medical examination without abnormal findings: Secondary | ICD-10-CM

## 2017-07-07 NOTE — Patient Instructions (Addendum)
Continue doing brain stimulating activities (puzzles, reading, adult coloring books, staying active) to keep memory sharp.   Continue to eat heart healthy diet (full of fruits, vegetables, whole grains, lean protein, water--limit salt, fat, and sugar intake) and increase physical activity as tolerated.   Mark [name] , Thank you for taking time to come for your Medicare Wellness Visit. I appreciate your ongoing commitment to your health goals. Please review the following plan we discussed and let me know if I can assist you in the future.   These are the goals we discussed: Goals    . Patient Stated     Lose weight by decreasing my portions and eating healthier. Start to walk 20 minutes a day.        This is a list of the screening recommended for you and due dates:  Health Maintenance  Topic Date Due  . HIV Screening  01/18/1982  . Colon Cancer Screening  01/18/2017  . Tetanus Vaccine  12/22/2022  . Flu Shot  Completed    High-Fiber Diet Fiber, also called dietary fiber, is a type of carbohydrate found in fruits, vegetables, whole grains, and beans. A high-fiber diet can have many health benefits. Your health care provider may recommend a high-fiber diet to help:  Prevent constipation. Fiber can make your bowel movements more regular.  Lower your cholesterol.  Relieve hemorrhoids, uncomplicated diverticulosis, or irritable bowel syndrome.  Prevent overeating as part of a weight-loss plan.  Prevent heart disease, type 2 diabetes, and certain cancers.  What is my plan? The recommended daily intake of fiber includes:  38 grams for men under age 49.  56 grams for men over age 64.  79 grams for women under age 66.  26 grams for women over age 62.  You can get the recommended daily intake of dietary fiber by eating a variety of fruits, vegetables, grains, and beans. Your health care provider may also recommend a fiber supplement if it is not possible to get enough fiber  through your diet. What do I need to know about a high-fiber diet?  Fiber supplements have not been widely studied for their effectiveness, so it is better to get fiber through food sources.  Always check the fiber content on thenutrition facts label of any prepackaged food. Look for foods that contain at least 5 grams of fiber per serving.  Ask your dietitian if you have questions about specific foods that are related to your condition, especially if those foods are not listed in the following section.  Increase your daily fiber consumption gradually. Increasing your intake of dietary fiber too quickly may cause bloating, cramping, or gas.  Drink plenty of water. Water helps you to digest fiber. What foods can I eat? Grains Whole-grain breads. Multigrain cereal. Oats and oatmeal. Brown rice. Barley. Bulgur wheat. [place]. Bran muffins. Popcorn. Rye wafer crackers. Vegetables Sweet potatoes. Spinach. Kale. Artichokes. Cabbage. Broccoli. Green peas. Carrots. Squash. Fruits Berries. Pears. Apples. Oranges. Avocados. Prunes and raisins. Dried figs. Meats and Other Protein Sources [place], kidney, pinto, and soy beans. Split peas. Lentils. Nuts and seeds. Dairy Fiber-fortified yogurt. Beverages Fiber-fortified soy milk. Fiber-fortified orange juice. Other Fiber bars. The items listed above may not be a complete list of recommended foods or beverages. Contact your dietitian for more options. What foods are not recommended? Grains White bread. Pasta made with refined flour. White rice. Vegetables Fried potatoes. Canned vegetables. Well-cooked vegetables. Fruits Fruit juice. Cooked, strained fruit. Meats and Other Protein Sources Fatty  cuts of meat. Fried Sales executive or fried fish. Dairy Milk. Yogurt. Cream cheese. Sour cream. Beverages Soft drinks. Other Cakes and pastries. Butter and oils. The items listed above may not be a complete list of foods and beverages to avoid. Contact your  dietitian for more information. What are some tips for including high-fiber foods in my diet?  Eat a wide variety of high-fiber foods.  Make sure that half of all grains consumed each day are whole grains.  Replace breads and cereals made from refined flour or white flour with whole-grain breads and cereals.  Replace white rice with brown rice, bulgur wheat, or millet.  Start the day with a breakfast that is high in fiber, such as a cereal that contains at least 5 grams of fiber per serving.  Use beans in place of meat in soups, salads, or pasta.  Eat high-fiber snacks, such as berries, raw vegetables, nuts, or popcorn. This information is not intended to replace advice given to you by your health care provider. Make sure you discuss any questions you have with your health care provider. Document Released: 06/15/2005 Document Revised: 11/21/2015 Document Reviewed: 11/28/2013 Elsevier Interactive Patient Education  2018 [place] Diet A bland diet consists of foods that do not have a lot of fat or fiber. Foods without fat or fiber are easier for the body to digest. They are also less likely to irritate your mouth, throat, stomach, and other parts of your gastrointestinal tract. A bland diet is sometimes called a BRAT diet. What is my plan? Your health care provider or dietitian may recommend specific changes to your diet to prevent and treat your symptoms, such as:  Eating small meals often.  Cooking food until it is soft enough to chew easily.  Chewing your food well.  Drinking fluids slowly.  Not eating foods that are very spicy, sour, or fatty.  Not eating citrus fruits, such as oranges and grapefruit.  What do I need to know about this diet?  Eat a variety of foods from the bland diet food list.  Do not follow a bland diet longer than you have to.  Ask your health care provider whether you should take vitamins. What foods can I eat? Grains  Hot cereals,  such as cream of wheat. Bread, crackers, or tortillas made from refined white flour. Rice. Vegetables Canned or cooked vegetables. Mashed or boiled potatoes. Fruits Bananas. Applesauce. Other types of cooked or canned fruit with the skin and seeds removed, such as canned peaches or pears. Meats and Other Protein Sources Scrambled eggs. Creamy peanut butter or other nut butters. Lean, well-cooked meats, such as chicken or fish. Tofu. Soups or broths. Dairy Low-fat dairy products, such as milk, cottage cheese, or yogurt. Beverages Water. Herbal tea. Apple juice. Sweets and Desserts Pudding. Custard. Fruit gelatin. Ice cream. Fats and Oils Mild salad dressings. [place] or olive oil. The items listed above may not be a complete list of allowed foods or beverages. Contact your dietitian for more options. What foods are not recommended? Foods and ingredients that are often not recommended include:  Spicy foods, such as hot sauce or salsa.  Fried foods.  Sour foods, such as pickled or fermented foods.  Raw vegetables or fruits, especially citrus or berries.  Caffeinated drinks.  Alcohol.  Strongly flavored seasonings or condiments.  The items listed above may not be a complete list of foods and beverages that are not allowed. Contact your dietitian for more information. This information  is not intended to replace advice given to you by your health care provider. Make sure you discuss any questions you have with your health care provider. Document Released: 10/07/2015 Document Revised: 11/21/2015 Document Reviewed: 06/27/2014 Elsevier Interactive Patient Education  2018 [place] Stress Reduction Mindfulness-based stress reduction (MBSR) is a program that helps people learn to practice mindfulness. Mindfulness is the practice of intentionally paying attention to the present moment. It can be learned and practiced through techniques such as education, breathing  exercises, meditation, and yoga. MBSR includes several mindfulness techniques in one program. MBSR works best when you understand the treatment, are willing to try new things, and can commit to spending time practicing what you learn. MBSR training may include learning about:  How your emotions, thoughts, and reactions affect your body.  New ways to respond to things that cause negative thoughts to start (triggers).  How to notice your thoughts and let go of them.  Practicing awareness of everyday things that you normally do without thinking.  The techniques and goals of different types of meditation.  What are the benefits of MBSR? MBSR can have many benefits, which include helping you to:  Develop self-awareness. This refers to knowing and understanding yourself.  Learn skills and attitudes that help you to participate in your own health care.  Learn new ways to care for yourself.  Be more accepting about how things are, and let things go.  Be less judgmental and approach things with an open mind.  Be patient with yourself and trust yourself more.  MBSR has also been shown to:  Reduce negative emotions, such as depression and anxiety.  Improve memory and focus.  Change how you sense and approach pain.  Boost your body's ability to fight infections.  Help you connect better with other people.  Improve your sense of well-being.  Follow these instructions at home:  Find a local in-person or online MBSR program.  Set aside some time regularly for mindfulness practice.  Find a mindfulness practice that works best for you. This may include one or more of the following: ? Meditation. Meditation involves focusing your mind on a certain thought or activity. ? Breathing awareness exercises. These help you to stay present by focusing on your breath. ? Body scan. For this practice, you lie down and pay attention to each part of your body from head to toe. You can identify  tension and soreness and intentionally relax parts of your body. ? Yoga. Yoga involves stretching and breathing, and it can improve your ability to move and be flexible. It can also provide an experience of testing your body's limits, which can help you release stress. ? Mindful eating. This way of eating involves focusing on the taste, texture, color, and smell of each bite of food. Because this slows down eating and helps you feel full sooner, it can be an important part of a weight-loss plan.  Find a podcast or recording that provides guidance for breathing awareness, body scan, or meditation exercises. You can listen to these any time when you have a free moment to rest without distractions.  Follow your treatment plan as told by your health care provider. This may include taking regular medicines and making changes to your diet or lifestyle as recommended. How to practice mindfulness To do a basic awareness exercise:  Find a comfortable place to sit.  Pay attention to the present moment. Observe your thoughts, feelings, and surroundings just as they are.  Avoid placing judgment on yourself, your feelings, or your surroundings. Make note of any judgment that comes up, and let it go.  Your mind may wander, and that is okay. Make note of when your thoughts drift, and return your attention to the present moment.  To do basic mindfulness meditation:  Find a comfortable place to sit. This may include a stable chair or a firm floor cushion. ? Sit upright with your back straight. Let your arms fall next to your side with your hands resting on your legs. ? If sitting in a chair, rest your feet flat on the floor. ? If sitting on a cushion, cross your legs in front of you.  Keep your head in a neutral position with your chin dropped slightly. Relax your jaw and rest the tip of your tongue on the roof of your mouth. Drop your gaze to the floor. You can close your eyes if you like.  Breathe  normally and pay attention to your breath. Feel the air moving in and out of your nose. Feel your belly expanding and relaxing with each breath.  Your mind may wander, and that is okay. Make note of when your thoughts drift, and return your attention to your breath.  Avoid placing judgment on yourself, your feelings, or your surroundings. Make note of any judgment or feelings that come up, let them go, and bring your attention back to your breath.  When you are ready, lift your gaze or open your eyes. Pay attention to how your body feels after the meditation.  Where to find more information: You can find more information about MBSR from:  Your health care provider.  Community-based meditation centers or programs.  Programs offered near you.  Summary  Mindfulness-based stress reduction (MBSR) is a program that teaches you how to intentionally pay attention to the present moment. It is used with other treatments to help you cope better with daily stress, emotions, and pain.  MBSR focuses on developing self-awareness, which allows you to respond to life stress without judgment or negative emotions.  MBSR programs may involve learning different mindfulness practices, such as breathing exercises, meditation, yoga, body scan, or mindful eating. Find a mindfulness practice that works best for you, and set aside time for it on a regular basis. This information is not intended to replace advice given to you by your health care provider. Make sure you discuss any questions you have with your health care provider. Document Released: 10/22/2016 Document Revised: 10/22/2016 Document Reviewed: 10/22/2016 Elsevier Interactive Patient Education  2018 [employer]. Insomnia Insomnia is a sleep disorder that makes it difficult to fall asleep or to stay asleep. Insomnia can cause tiredness (fatigue), low energy, difficulty concentrating, mood swings, and poor performance at work or school. There are three  different ways to classify insomnia:  Difficulty falling asleep.  Difficulty staying asleep.  Waking up too early in the morning.  Any type of insomnia can be long-term (chronic) or short-term (acute). Both are common. Short-term insomnia usually lasts for three months or less. Chronic insomnia occurs at least three times a week for longer than three months. What are the causes? Insomnia may be caused by another condition, situation, or substance, such as:  Anxiety.  Certain medicines.  Gastroesophageal reflux disease (GERD) or other gastrointestinal conditions.  Asthma or other breathing conditions.  Restless legs syndrome, sleep apnea, or other sleep disorders.  Chronic pain.  Menopause. This may include hot flashes.  Stroke.  Abuse of alcohol,  tobacco, or illegal drugs.  Depression.  Caffeine.  Neurological disorders, such as Alzheimer disease.  An overactive thyroid (hyperthyroidism).  The cause of insomnia may not be known. What increases the risk? Risk factors for insomnia include:  Gender. Women are more commonly affected than men.  Age. Insomnia is more common as you get older.  Stress. This may involve your professional or personal life.  Income. Insomnia is more common in people with lower income.  Lack of exercise.  Irregular work schedule or night shifts.  Traveling between different time zones.  What are the signs or symptoms? If you have insomnia, trouble falling asleep or trouble staying asleep is the main symptom. This may lead to other symptoms, such as:  Feeling fatigued.  Feeling nervous about going to sleep.  Not feeling rested in the morning.  Having trouble concentrating.  Feeling irritable, anxious, or depressed.  How is this treated? Treatment for insomnia depends on the cause. If your insomnia is caused by an underlying condition, treatment will focus on addressing the condition. Treatment may also include:  Medicines to  help you sleep.  Counseling or therapy.  Lifestyle adjustments.  Follow these instructions at home:  Take medicines only as directed by your health care provider.  Keep regular sleeping and waking hours. Avoid naps.  Keep a sleep diary to help you and your health care provider figure out what could be causing your insomnia. Include: ? When you sleep. ? When you wake up during the night. ? How well you sleep. ? How rested you feel the next day. ? Any side effects of medicines you are taking. ? What you eat and drink.  Make your bedroom a comfortable place where it is easy to fall asleep: ? Put up shades or special blackout curtains to block light from outside. ? Use a white noise machine to block noise. ? Keep the temperature cool.  Exercise regularly as directed by your health care provider. Avoid exercising right before bedtime.  Use relaxation techniques to manage stress. Ask your health care provider to suggest some techniques that may work well for you. These may include: ? Breathing exercises. ? Routines to release muscle tension. ? Visualizing peaceful scenes.  Cut back on alcohol, caffeinated beverages, and cigarettes, especially close to bedtime. These can disrupt your sleep.  Do not overeat or eat spicy foods right before bedtime. This can lead to digestive discomfort that can make it hard for you to sleep.  Limit screen use before bedtime. This includes: ? Watching TV. ? Using your smartphone, tablet, and computer.  Stick to a routine. This can help you fall asleep faster. Try to do a quiet activity, brush your teeth, and go to bed at the same time each night.  Get out of bed if you are still awake after 15 minutes of trying to sleep. Keep the lights down, but try reading or doing a quiet activity. When you feel sleepy, go back to bed.  Make sure that you drive carefully. Avoid driving if you feel very sleepy.  Keep all follow-up appointments as directed by  your health care provider. This is important. Contact a health care provider if:  You are tired throughout the day or have trouble in your daily routine due to sleepiness.  You continue to have sleep problems or your sleep problems get worse. Get help right away if:  You have serious thoughts about hurting yourself or someone else. This information is not intended to replace advice  given to you by your health care provider. Make sure you discuss any questions you have with your health care provider. Document Released: 06/12/2000 Document Revised: 11/15/2015 Document Reviewed: 03/16/2014 Elsevier Interactive Patient Education  [employer].

## 2017-07-19 ENCOUNTER — Other Ambulatory Visit: Payer: Self-pay

## 2017-07-19 DIAGNOSIS — F329 Major depressive disorder, single episode, unspecified: Secondary | ICD-10-CM

## 2017-07-19 DIAGNOSIS — F32A Depression, unspecified: Secondary | ICD-10-CM

## 2017-07-19 MED ORDER — CITALOPRAM HYDROBROMIDE 40 MG PO TABS: 40.0000 mg | ORAL_TABLET | Freq: Every day | ORAL | 3 refills | Status: DC

## 2017-07-21 ENCOUNTER — Encounter: Payer: Self-pay | Admitting: Registered Nurse

## 2017-07-21 ENCOUNTER — Encounter: Payer: Medicare Other | Attending: Physical Medicine & Rehabilitation | Admitting: Registered Nurse

## 2017-07-21 VITALS — BP 136/80 | HR 64

## 2017-07-21 DIAGNOSIS — Z5181 Encounter for therapeutic drug level monitoring: Secondary | ICD-10-CM

## 2017-07-21 DIAGNOSIS — S06890S Other specified intracranial injury without loss of consciousness, sequela: Secondary | ICD-10-CM | POA: Diagnosis present

## 2017-07-21 DIAGNOSIS — G8110 Spastic hemiplegia affecting unspecified side: Secondary | ICD-10-CM | POA: Insufficient documentation

## 2017-07-21 DIAGNOSIS — Z79899 Other long term (current) drug therapy: Secondary | ICD-10-CM | POA: Diagnosis not present

## 2017-07-21 DIAGNOSIS — F329 Major depressive disorder, single episode, unspecified: Secondary | ICD-10-CM | POA: Diagnosis not present

## 2017-07-21 DIAGNOSIS — D62 Acute posthemorrhagic anemia: Secondary | ICD-10-CM | POA: Insufficient documentation

## 2017-07-21 DIAGNOSIS — G44309 Post-traumatic headache, unspecified, not intractable: Secondary | ICD-10-CM

## 2017-07-21 DIAGNOSIS — F1020 Alcohol dependence, uncomplicated: Secondary | ICD-10-CM | POA: Diagnosis not present

## 2017-07-21 DIAGNOSIS — S06890D Other specified intracranial injury without loss of consciousness, subsequent encounter: Secondary | ICD-10-CM | POA: Insufficient documentation

## 2017-07-21 DIAGNOSIS — H547 Unspecified visual loss: Secondary | ICD-10-CM | POA: Diagnosis not present

## 2017-07-21 DIAGNOSIS — S069X5S Unspecified intracranial injury with loss of consciousness greater than 24 hours with return to pre-existing conscious level, sequela: Secondary | ICD-10-CM

## 2017-07-21 DIAGNOSIS — Z87891 Personal history of nicotine dependence: Secondary | ICD-10-CM | POA: Diagnosis not present

## 2017-07-21 MED ORDER — METHYLPHENIDATE HCL 10 MG PO TABS: 15.0000 mg | ORAL_TABLET | Freq: Two times a day (BID) | ORAL | 0 refills | Status: DC
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
## 2017-07-29 ENCOUNTER — Telehealth: Payer: Self-pay | Admitting: *Deleted

## 2017-07-29 NOTE — Telephone Encounter (Signed)
Patient asking for Mark [name] to call him back

## 2017-08-03 NOTE — Telephone Encounter (Signed)
Return Mr. Bratcher call, no answer left message to return the call.

## 2017-08-05 ENCOUNTER — Telehealth: Payer: Self-pay

## 2017-08-05 NOTE — Telephone Encounter (Signed)
Patient called requesting call back and to speak with Zella Ball, called him back and transferred call to [facility], she spoke with him on phone and mentioned to him to go back to vocational rehab and see about a life coach.

## 2017-08-31 DIAGNOSIS — R51 Headache: Secondary | ICD-10-CM | POA: Diagnosis not present

## 2017-08-31 DIAGNOSIS — S069X9A Unspecified intracranial injury with loss of consciousness of unspecified duration, initial encounter: Secondary | ICD-10-CM | POA: Diagnosis not present

## 2017-08-31 DIAGNOSIS — H538 Other visual disturbances: Secondary | ICD-10-CM | POA: Diagnosis not present

## 2017-08-31 DIAGNOSIS — H4720 Unspecified optic atrophy: Secondary | ICD-10-CM | POA: Diagnosis not present

## 2017-09-14 DIAGNOSIS — Z1212 Encounter for screening for malignant neoplasm of rectum: Secondary | ICD-10-CM | POA: Diagnosis not present

## 2017-09-14 DIAGNOSIS — Z1211 Encounter for screening for malignant neoplasm of colon: Secondary | ICD-10-CM | POA: Diagnosis not present

## 2017-09-14 LAB — COLOGUARD: Cologuard: POSITIVE

## 2017-09-17 ENCOUNTER — Telehealth: Payer: Self-pay | Admitting: Internal Medicine

## 2017-09-17 NOTE — Telephone Encounter (Signed)
Copied from [place] [phone]. Topic: General - Call Back - No Documentation >> Sep 16, 2017  3:01 PM Conception Chancy, NT wrote: Patient is calling and states he missed a call from the office. Please advise.  >> Sep 17, 2017  3:02 PM Cleaster Corin, NT wrote: Pt. Calling back to schedule medicare AWV called 3x line busy let pt. Know someone will give him a call back

## 2017-09-20 NOTE — Telephone Encounter (Signed)
Received message that [name] wanted a call back regarding [employer]. Patient completed AWV on 07/07/2017. Attempted to call patient to inform him that appointment has been completed already for 2019 but patient did not answer. SF

## 2017-09-22 ENCOUNTER — Encounter: Payer: Medicare Other | Attending: Physical Medicine & Rehabilitation | Admitting: Physical Medicine & Rehabilitation

## 2017-09-22 ENCOUNTER — Encounter: Payer: Self-pay | Admitting: Physical Medicine & Rehabilitation

## 2017-09-22 VITALS — BP 134/85 | HR 60 | Resp 14 | Ht 72.0 in | Wt 276.0 lb

## 2017-09-22 DIAGNOSIS — L309 Dermatitis, unspecified: Secondary | ICD-10-CM | POA: Diagnosis not present

## 2017-09-22 DIAGNOSIS — Z79899 Other long term (current) drug therapy: Secondary | ICD-10-CM | POA: Diagnosis not present

## 2017-09-22 DIAGNOSIS — F329 Major depressive disorder, single episode, unspecified: Secondary | ICD-10-CM | POA: Insufficient documentation

## 2017-09-22 DIAGNOSIS — S069X5S Unspecified intracranial injury with loss of consciousness greater than 24 hours with return to pre-existing conscious level, sequela: Secondary | ICD-10-CM | POA: Diagnosis not present

## 2017-09-22 DIAGNOSIS — G44309 Post-traumatic headache, unspecified, not intractable: Secondary | ICD-10-CM | POA: Diagnosis not present

## 2017-09-22 DIAGNOSIS — H547 Unspecified visual loss: Secondary | ICD-10-CM | POA: Diagnosis not present

## 2017-09-22 DIAGNOSIS — S06890S Other specified intracranial injury without loss of consciousness, sequela: Secondary | ICD-10-CM | POA: Diagnosis present

## 2017-09-22 DIAGNOSIS — G8110 Spastic hemiplegia affecting unspecified side: Secondary | ICD-10-CM | POA: Diagnosis not present

## 2017-09-22 DIAGNOSIS — F908 Attention-deficit hyperactivity disorder, other type: Secondary | ICD-10-CM

## 2017-09-22 DIAGNOSIS — F1020 Alcohol dependence, uncomplicated: Secondary | ICD-10-CM | POA: Diagnosis not present

## 2017-09-22 DIAGNOSIS — Z87891 Personal history of nicotine dependence: Secondary | ICD-10-CM | POA: Diagnosis not present

## 2017-09-22 DIAGNOSIS — S06890D Other specified intracranial injury without loss of consciousness, subsequent encounter: Secondary | ICD-10-CM | POA: Insufficient documentation

## 2017-09-22 DIAGNOSIS — Z5181 Encounter for therapeutic drug level monitoring: Secondary | ICD-10-CM

## 2017-09-22 DIAGNOSIS — F988 Other specified behavioral and emotional disorders with onset usually occurring in childhood and adolescence: Secondary | ICD-10-CM | POA: Insufficient documentation

## 2017-09-22 DIAGNOSIS — D62 Acute posthemorrhagic anemia: Secondary | ICD-10-CM | POA: Insufficient documentation

## 2017-09-22 MED ORDER — METHYLPHENIDATE HCL 10 MG PO TABS: 15.0000 mg | ORAL_TABLET | Freq: Two times a day (BID) | ORAL | 0 refills | Status: DC

## 2017-09-22 NOTE — Patient Instructions (Signed)
PLEASE FEEL FREE TO CALL OUR OFFICE WITH ANY PROBLEMS OR QUESTIONS ([phone])      
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
## 2017-09-23 ENCOUNTER — Telehealth: Payer: Self-pay | Admitting: Internal Medicine

## 2017-09-23 ENCOUNTER — Encounter: Payer: Self-pay | Admitting: Gastroenterology

## 2017-09-23 NOTE — Telephone Encounter (Signed)
Previous note started with this information. Closing this note.

## 2017-09-23 NOTE — Telephone Encounter (Signed)
Copied from [place]. Topic: Quick Communication - See Telephone Encounter >> Sep 23, 2017  1:51 PM Ether Griffins B wrote: CRM for notification. See Telephone encounter for: 09/23/17.  [name] calling wanting to know why a colonoscopy is needing to be scheduled when they have done the colo  guard and havent heard results from that yet.

## 2017-09-23 NOTE — Telephone Encounter (Addendum)
Pt called back, please call back  Please call cell  [phone]

## 2017-09-23 NOTE — Telephone Encounter (Signed)
Pt contacted and given the results. Pt mother also informed of same and both stated understanding.

## 2017-09-23 NOTE — Telephone Encounter (Signed)
Results came back positive from [employer].   PCP informed. Okay to release results to patient.   AMB GI referral for positive cologuard.   Tried to contact patient. Not able to get in touch with him at this time.   Results abstracted.

## 2017-09-23 NOTE — Telephone Encounter (Signed)
Previous note started about this. Closing the note.

## 2017-09-29 LAB — TOXASSURE SELECT,+ANTIDEPR,UR

## 2017-09-30 ENCOUNTER — Telehealth: Payer: Self-pay | Admitting: *Deleted

## 2017-09-30 NOTE — Telephone Encounter (Signed)
Urine drug screen for this encounter is consistent for prescribed medication 

## 2017-10-27 HISTORY — PX: OTHER SURGICAL HISTORY: SHX169

## 2017-11-03 ENCOUNTER — Encounter: Payer: Self-pay | Admitting: Internal Medicine

## 2017-11-03 DIAGNOSIS — L2089 Other atopic dermatitis: Secondary | ICD-10-CM | POA: Diagnosis not present

## 2017-11-03 DIAGNOSIS — L418 Other parapsoriasis: Secondary | ICD-10-CM | POA: Diagnosis not present

## 2017-11-08 ENCOUNTER — Ambulatory Visit (AMBULATORY_SURGERY_CENTER): Payer: Self-pay | Admitting: *Deleted

## 2017-11-08 ENCOUNTER — Other Ambulatory Visit: Payer: Self-pay

## 2017-11-08 VITALS — Ht 72.0 in | Wt 275.0 lb

## 2017-11-08 DIAGNOSIS — R195 Other fecal abnormalities: Secondary | ICD-10-CM

## 2017-11-08 MED ORDER — PEG 3350-KCL-NA BICARB-NACL 420 G PO SOLR: 4000.0000 mL | Freq: Once | ORAL | 0 refills | Status: AC

## 2017-11-08 NOTE — Progress Notes (Signed)
Patient denies any allergies to eggs or soy. Patient denies any problems with anesthesia/sedation. Patient denies any oxygen use at home. Patient denies taking any diet/weight loss medications or blood thinners. EMMI education assisgned to patient on colonoscopy, this was explained and instructions given to patient. 

## 2017-11-17 ENCOUNTER — Encounter: Payer: Self-pay | Admitting: Registered Nurse

## 2017-11-17 ENCOUNTER — Encounter: Payer: Medicare Other | Attending: Physical Medicine & Rehabilitation | Admitting: Registered Nurse

## 2017-11-17 ENCOUNTER — Other Ambulatory Visit: Payer: Self-pay | Admitting: Physical Medicine & Rehabilitation

## 2017-11-17 VITALS — BP 130/81 | HR 62 | Resp 14 | Ht 72.0 in | Wt 275.0 lb

## 2017-11-17 DIAGNOSIS — F329 Major depressive disorder, single episode, unspecified: Secondary | ICD-10-CM

## 2017-11-17 DIAGNOSIS — G8110 Spastic hemiplegia affecting unspecified side: Secondary | ICD-10-CM | POA: Insufficient documentation

## 2017-11-17 DIAGNOSIS — F1020 Alcohol dependence, uncomplicated: Secondary | ICD-10-CM | POA: Diagnosis not present

## 2017-11-17 DIAGNOSIS — Z5181 Encounter for therapeutic drug level monitoring: Secondary | ICD-10-CM

## 2017-11-17 DIAGNOSIS — Z87891 Personal history of nicotine dependence: Secondary | ICD-10-CM | POA: Insufficient documentation

## 2017-11-17 DIAGNOSIS — S06890D Other specified intracranial injury without loss of consciousness, subsequent encounter: Secondary | ICD-10-CM | POA: Insufficient documentation

## 2017-11-17 DIAGNOSIS — F32A Depression, unspecified: Secondary | ICD-10-CM

## 2017-11-17 DIAGNOSIS — Z79899 Other long term (current) drug therapy: Secondary | ICD-10-CM

## 2017-11-17 DIAGNOSIS — S06890S Other specified intracranial injury without loss of consciousness, sequela: Secondary | ICD-10-CM | POA: Diagnosis present

## 2017-11-17 DIAGNOSIS — S069X5S Unspecified intracranial injury with loss of consciousness greater than 24 hours with return to pre-existing conscious level, sequela: Secondary | ICD-10-CM

## 2017-11-17 DIAGNOSIS — H547 Unspecified visual loss: Secondary | ICD-10-CM | POA: Insufficient documentation

## 2017-11-17 DIAGNOSIS — D62 Acute posthemorrhagic anemia: Secondary | ICD-10-CM | POA: Diagnosis not present

## 2017-11-17 DIAGNOSIS — G44309 Post-traumatic headache, unspecified, not intractable: Secondary | ICD-10-CM

## 2017-11-17 MED ORDER — METHYLPHENIDATE HCL 10 MG PO TABS: 15.0000 mg | ORAL_TABLET | Freq: Two times a day (BID) | ORAL | 0 refills | Status: DC
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
## 2017-11-17 NOTE — Patient Instructions (Signed)
Call Office in three weeks to evaluate Medication change : Call on June 17th in the afternoon.

## 2017-11-23 ENCOUNTER — Ambulatory Visit (AMBULATORY_SURGERY_CENTER): Payer: Medicare Other | Admitting: Gastroenterology

## 2017-11-23 ENCOUNTER — Encounter: Payer: Self-pay | Admitting: Gastroenterology

## 2017-11-23 VITALS — BP 134/84 | HR 58 | Temp 98.2°F | Resp 15 | Ht 72.0 in | Wt 275.0 lb

## 2017-11-23 DIAGNOSIS — D129 Benign neoplasm of anus and anal canal: Secondary | ICD-10-CM

## 2017-11-23 DIAGNOSIS — D128 Benign neoplasm of rectum: Secondary | ICD-10-CM

## 2017-11-23 DIAGNOSIS — D124 Benign neoplasm of descending colon: Secondary | ICD-10-CM

## 2017-11-23 DIAGNOSIS — K621 Rectal polyp: Secondary | ICD-10-CM

## 2017-11-23 DIAGNOSIS — D122 Benign neoplasm of ascending colon: Secondary | ICD-10-CM

## 2017-11-23 DIAGNOSIS — K635 Polyp of colon: Secondary | ICD-10-CM | POA: Diagnosis not present

## 2017-11-23 DIAGNOSIS — D125 Benign neoplasm of sigmoid colon: Secondary | ICD-10-CM

## 2017-11-23 DIAGNOSIS — R195 Other fecal abnormalities: Secondary | ICD-10-CM | POA: Diagnosis not present

## 2017-11-23 DIAGNOSIS — F909 Attention-deficit hyperactivity disorder, unspecified type: Secondary | ICD-10-CM | POA: Diagnosis not present

## 2017-11-23 DIAGNOSIS — G8110 Spastic hemiplegia affecting unspecified side: Secondary | ICD-10-CM | POA: Diagnosis not present

## 2017-11-23 LAB — HM COLONOSCOPY

## 2017-11-23 MED ORDER — SODIUM CHLORIDE 0.9 % IV SOLN: 500.0000 mL | Freq: Once | INTRAVENOUS | Status: DC

## 2017-11-23 NOTE — Progress Notes (Signed)
Pt's states no medical or surgical changes since previsit or office visit. 

## 2017-11-23 NOTE — Op Note (Signed)
[facility] Patient Name: Mark [name] Procedure Date: 11/23/2017 8:36 AM MRN: [id number] Endoscopist: Ladene Artist , MD Age: 52 Referring MD:  Date of Birth: Dec 04, 1965 Gender: Male Account #: [id number] Procedure:                Colonoscopy Indications:              Positive Cologuard test Medicines:                Monitored Anesthesia Care Procedure:                Pre-Anesthesia Assessment:                           - Prior to the procedure, a History and Physical                            was performed, and patient medications and                            allergies were reviewed. The patient's tolerance of                            previous anesthesia was also reviewed. The risks                            and benefits of the procedure and the sedation                            options and risks were discussed with the patient.                            All questions were answered, and informed consent                            was obtained. Prior Anticoagulants: The patient has                            taken no previous anticoagulant or antiplatelet                            agents. ASA Grade Assessment: III - A patient with                            severe systemic disease. After reviewing the risks                            and benefits, the patient was deemed in                            satisfactory condition to undergo the procedure.                           After obtaining informed consent, the colonoscope  was passed under direct vision. Throughout the                            procedure, the patient's blood pressure, pulse, and                            oxygen saturations were monitored continuously. The                            Colonoscope was introduced through the anus and                            advanced to the the cecum, identified by                            appendiceal orifice and ileocecal valve. The                             ileocecal valve, appendiceal orifice, and rectum                            were photographed. The quality of the bowel                            preparation was excellent. The colonoscopy was                            performed without difficulty. The patient tolerated                            the procedure well. Scope In: 8:46:55 AM Scope Out: 9:10:48 AM Scope Withdrawal Time: 0 hours 21 minutes 21 seconds  Total Procedure Duration: 0 hours 23 minutes 53 seconds  Findings:                 The perianal and digital rectal examinations were                            normal.                           Six sessile polyps were found in the rectum (1),                            sigmoid colon (3) and descending colon (2). The                            polyps were 6 to 8 mm in size. These polyps were                            removed with a cold snare. Resection and retrieval                            were complete.  Three sessile polyps were found in the sigmoid                            colon, descending colon and ascending colon. The                            polyps were 3 to 4 mm in size. These polyps were                            removed with a cold biopsy forceps. Resection and                            retrieval were complete.                           Internal hemorrhoids were found during                            retroflexion. The hemorrhoids were small and Grade                            I (internal hemorrhoids that do not prolapse).                           The exam was otherwise without abnormality on                            direct and retroflexion views. Complications:            No immediate complications. Estimated blood loss:                            None. Estimated Blood Loss:     Estimated blood loss: none. Impression:               - Six 6 to 8 mm polyps in the rectum, in the                             sigmoid colon and in the descending colon, removed                            with a cold snare. Resected and retrieved.                           - Three 3 to 4 mm polyps in the sigmoid colon, in                            the descending colon and in the ascending colon,                            removed with a cold biopsy forceps. Resected and                            retrieved.                           -  Internal hemorrhoids.                           - The examination was otherwise normal on direct                            and retroflexion views. Recommendation:           - Repeat colonoscopy date to be determined after                            pending pathology results are reviewed for                            surveillance.                           - Patient has a contact number available for                            emergencies. The signs and symptoms of potential                            delayed complications were discussed with the                            patient. Return to normal activities tomorrow.                            Written discharge instructions were provided to the                            patient.                           - Resume previous diet.                           - Continue present medications.                           - Await pathology results. Ladene Artist, MD 11/23/2017 9:15:54 AM This report has been signed electronically.

## 2017-11-23 NOTE — Progress Notes (Signed)
Report to PACU, RN, vss, BBS= Clear.  

## 2017-11-23 NOTE — Progress Notes (Signed)
Called to room to assist during endoscopic procedure.  Patient ID and intended procedure confirmed with present staff. Received instructions for my participation in the procedure from the performing physician.  

## 2017-11-23 NOTE — Patient Instructions (Signed)
Handouts given for : Polyps, Diverticulosis and Hemorrhoids.  YOU HAD AN ENDOSCOPIC PROCEDURE TODAY AT [facility]:   Refer to the procedure report that was given to you for any specific questions about what was found during the examination.  If the procedure report does not answer your questions, please call your gastroenterologist to clarify.  If you requested that your care partner not be given the details of your procedure findings, then the procedure report has been included in a sealed envelope for you to review at your convenience later.  YOU SHOULD EXPECT: Some feelings of bloating in the abdomen. Passage of more gas than usual.  Walking can help get rid of the air that was put into your GI tract during the procedure and reduce the bloating. If you had a lower endoscopy (such as a colonoscopy or flexible sigmoidoscopy) you may notice spotting of blood in your stool or on the toilet paper. If you underwent a bowel prep for your procedure, you may not have a normal bowel movement for a few days.  Please Note:  You might notice some irritation and congestion in your nose or some drainage.  This is from the oxygen used during your procedure.  There is no need for concern and it should clear up in a day or so.  SYMPTOMS TO REPORT IMMEDIATELY:   Following lower endoscopy (colonoscopy or flexible sigmoidoscopy):  Excessive amounts of blood in the stool  Significant tenderness or worsening of abdominal pains  Swelling of the abdomen that is new, acute  Fever of 100F or higher   For urgent or emergent issues, a gastroenterologist can be reached at any hour by calling [phone].   DIET:  We do recommend a small meal at first, but then you may proceed to your regular diet.  Drink plenty of fluids but you should avoid alcoholic beverages for 24 hours.  ACTIVITY:  You should plan to take it easy for the rest of today and you should NOT DRIVE or use heavy machinery until  tomorrow (because of the sedation medicines used during the test).    FOLLOW UP: Our staff will call the number listed on your records the next business day following your procedure to check on you and address any questions or concerns that you may have regarding the information given to you following your procedure. If we do not reach you, we will leave a message.  However, if you are feeling well and you are not experiencing any problems, there is no need to return our call.  We will assume that you have returned to your regular daily activities without incident.  If any biopsies were taken you will be contacted by phone or by letter within the next 1-3 weeks.  Please call us at [phone] if you have not heard about the biopsies in 3 weeks.    SIGNATURES/CONFIDENTIALITY: You and/or your care partner have signed paperwork which will be entered into your electronic medical record.  These signatures attest to the fact that that the information above on your After Visit Summary has been reviewed and is understood.  Full responsibility of the confidentiality of this discharge information lies with you and/or your care-partner.

## 2017-11-24 ENCOUNTER — Telehealth: Payer: Self-pay

## 2017-11-24 ENCOUNTER — Telehealth: Payer: Self-pay | Admitting: *Deleted

## 2017-11-24 NOTE — Telephone Encounter (Signed)
Left message on f/u call 

## 2017-11-24 NOTE — Telephone Encounter (Signed)
Left message

## 2017-12-01 ENCOUNTER — Encounter: Payer: Self-pay | Admitting: Gastroenterology

## 2017-12-05 LAB — HM COLONOSCOPY

## 2017-12-09 DIAGNOSIS — L2089 Other atopic dermatitis: Secondary | ICD-10-CM | POA: Diagnosis not present

## 2017-12-13 ENCOUNTER — Telehealth: Payer: Self-pay | Admitting: *Deleted

## 2017-12-13 NOTE — Telephone Encounter (Signed)
Max Fickle called on patients behalf  Asking for Zella Ball to call regarding ritalin refill and possible medication reduction to 1 tablet a day instead of 2.

## 2017-12-14 NOTE — Telephone Encounter (Signed)
Return [name] call. All questions answered concerning the Ritalin. Mr. Villanueva is taking his Ritalin twice a day 3- 4 days a week and daily three days a week. She will continue to observe and call office back next week. She verbalizes understanding.

## 2017-12-24 ENCOUNTER — Telehealth: Payer: Self-pay

## 2017-12-24 DIAGNOSIS — G44309 Post-traumatic headache, unspecified, not intractable: Secondary | ICD-10-CM

## 2017-12-24 DIAGNOSIS — S069X5S Unspecified intracranial injury with loss of consciousness greater than 24 hours with return to pre-existing conscious level, sequela: Secondary | ICD-10-CM

## 2017-12-24 NOTE — Telephone Encounter (Signed)
Pt mother Max Fickle called stating she was told by Zella Ball to call with an update on Ritalin.

## 2017-12-27 MED ORDER — METHYLPHENIDATE HCL 10 MG PO TABS: 15.0000 mg | ORAL_TABLET | Freq: Two times a day (BID) | ORAL | 0 refills | Status: DC

## 2017-12-27 NOTE — Telephone Encounter (Signed)
Returm [name] call, she states Mark [name] has been taking Ritalin as prescribed there has been times when he has on;y taking one tablet twice a day. He has a scheduled appointment with Dr. Naaman Plummer on 01/24/2018. According to the PMP last prescription was picked up on 11/17/2017. Dr. Naaman Plummer will make the decision of reducing dose. She verbalizes understanding. Instructed to keep log, she verbalizes understanding.

## 2018-01-19 ENCOUNTER — Ambulatory Visit: Payer: Medicare Other | Admitting: Physical Medicine & Rehabilitation

## 2018-01-24 ENCOUNTER — Encounter: Payer: Self-pay | Admitting: Physical Medicine & Rehabilitation

## 2018-01-24 ENCOUNTER — Encounter: Payer: Medicare Other | Attending: Physical Medicine & Rehabilitation | Admitting: Physical Medicine & Rehabilitation

## 2018-01-24 ENCOUNTER — Ambulatory Visit: Payer: Medicare Other | Admitting: Physical Medicine & Rehabilitation

## 2018-01-24 VITALS — BP 129/83 | HR 69 | Ht 72.0 in | Wt 276.2 lb

## 2018-01-24 DIAGNOSIS — Z87891 Personal history of nicotine dependence: Secondary | ICD-10-CM | POA: Insufficient documentation

## 2018-01-24 DIAGNOSIS — S069X5S Unspecified intracranial injury with loss of consciousness greater than 24 hours with return to pre-existing conscious level, sequela: Secondary | ICD-10-CM

## 2018-01-24 DIAGNOSIS — F32A Depression, unspecified: Secondary | ICD-10-CM

## 2018-01-24 DIAGNOSIS — F1020 Alcohol dependence, uncomplicated: Secondary | ICD-10-CM | POA: Insufficient documentation

## 2018-01-24 DIAGNOSIS — S06890D Other specified intracranial injury without loss of consciousness, subsequent encounter: Secondary | ICD-10-CM | POA: Diagnosis not present

## 2018-01-24 DIAGNOSIS — D62 Acute posthemorrhagic anemia: Secondary | ICD-10-CM | POA: Insufficient documentation

## 2018-01-24 DIAGNOSIS — F908 Attention-deficit hyperactivity disorder, other type: Secondary | ICD-10-CM | POA: Diagnosis not present

## 2018-01-24 DIAGNOSIS — G44309 Post-traumatic headache, unspecified, not intractable: Secondary | ICD-10-CM | POA: Diagnosis not present

## 2018-01-24 DIAGNOSIS — H547 Unspecified visual loss: Secondary | ICD-10-CM | POA: Diagnosis not present

## 2018-01-24 DIAGNOSIS — S06890S Other specified intracranial injury without loss of consciousness, sequela: Secondary | ICD-10-CM | POA: Diagnosis present

## 2018-01-24 DIAGNOSIS — G8110 Spastic hemiplegia affecting unspecified side: Secondary | ICD-10-CM | POA: Diagnosis not present

## 2018-01-24 DIAGNOSIS — F329 Major depressive disorder, single episode, unspecified: Secondary | ICD-10-CM | POA: Diagnosis not present

## 2018-01-24 MED ORDER — METHYLPHENIDATE HCL 10 MG PO TABS: 15.0000 mg | ORAL_TABLET | Freq: Two times a day (BID) | ORAL | 0 refills | Status: DC

## 2018-01-24 MED ORDER — CITALOPRAM HYDROBROMIDE 40 MG PO TABS: 40.0000 mg | ORAL_TABLET | Freq: Every day | ORAL | 6 refills | Status: DC

## 2018-01-24 NOTE — Patient Instructions (Signed)
PLEASE FEEL FREE TO CALL OUR OFFICE WITH ANY PROBLEMS OR QUESTIONS ([phone])      
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
## 2018-01-31 ENCOUNTER — Telehealth: Payer: Self-pay | Admitting: *Deleted

## 2018-01-31 NOTE — Telephone Encounter (Signed)
Called for refill on Ritalin. It looks like it should be at the pharmacy. Mr Etienne notified.

## 2018-03-24 ENCOUNTER — Telehealth: Payer: Self-pay | Admitting: *Deleted

## 2018-03-24 DIAGNOSIS — G44309 Post-traumatic headache, unspecified, not intractable: Secondary | ICD-10-CM

## 2018-03-24 DIAGNOSIS — S069X5S Unspecified intracranial injury with loss of consciousness greater than 24 hours with return to pre-existing conscious level, sequela: Secondary | ICD-10-CM

## 2018-03-24 NOTE — Telephone Encounter (Signed)
Patient left a message asking for his 3rd and 4th month Rx's for Ritalin to be sent in to his pharmacy.

## 2018-03-25 MED ORDER — METHYLPHENIDATE HCL 10 MG PO TABS: 15.0000 mg | ORAL_TABLET | Freq: Two times a day (BID) | ORAL | 0 refills | Status: DC

## 2018-03-25 NOTE — Telephone Encounter (Signed)
Patient notified

## 2018-03-25 NOTE — Telephone Encounter (Signed)
rx'es sent

## 2018-05-17 ENCOUNTER — Ambulatory Visit: Payer: Medicare Other | Admitting: Registered Nurse

## 2018-05-17 ENCOUNTER — Encounter: Payer: Medicare Other | Admitting: Registered Nurse

## 2018-05-20 ENCOUNTER — Encounter: Payer: Medicare Other | Attending: Physical Medicine & Rehabilitation | Admitting: Registered Nurse

## 2018-05-20 ENCOUNTER — Encounter: Payer: Self-pay | Admitting: Registered Nurse

## 2018-05-20 ENCOUNTER — Other Ambulatory Visit: Payer: Self-pay

## 2018-05-20 VITALS — BP 128/85 | HR 65 | Ht 72.0 in | Wt 274.0 lb

## 2018-05-20 DIAGNOSIS — H547 Unspecified visual loss: Secondary | ICD-10-CM | POA: Diagnosis not present

## 2018-05-20 DIAGNOSIS — S06890D Other specified intracranial injury without loss of consciousness, subsequent encounter: Secondary | ICD-10-CM | POA: Insufficient documentation

## 2018-05-20 DIAGNOSIS — D62 Acute posthemorrhagic anemia: Secondary | ICD-10-CM | POA: Insufficient documentation

## 2018-05-20 DIAGNOSIS — G8110 Spastic hemiplegia affecting unspecified side: Secondary | ICD-10-CM | POA: Diagnosis not present

## 2018-05-20 DIAGNOSIS — S06890S Other specified intracranial injury without loss of consciousness, sequela: Secondary | ICD-10-CM | POA: Diagnosis present

## 2018-05-20 DIAGNOSIS — F1020 Alcohol dependence, uncomplicated: Secondary | ICD-10-CM | POA: Diagnosis not present

## 2018-05-20 DIAGNOSIS — F329 Major depressive disorder, single episode, unspecified: Secondary | ICD-10-CM | POA: Diagnosis not present

## 2018-05-20 DIAGNOSIS — S069X5S Unspecified intracranial injury with loss of consciousness greater than 24 hours with return to pre-existing conscious level, sequela: Secondary | ICD-10-CM

## 2018-05-20 DIAGNOSIS — Z87891 Personal history of nicotine dependence: Secondary | ICD-10-CM | POA: Diagnosis not present

## 2018-05-20 DIAGNOSIS — F908 Attention-deficit hyperactivity disorder, other type: Secondary | ICD-10-CM

## 2018-05-20 DIAGNOSIS — G44309 Post-traumatic headache, unspecified, not intractable: Secondary | ICD-10-CM | POA: Diagnosis not present

## 2018-05-20 MED ORDER — METHYLPHENIDATE HCL 10 MG PO TABS: 15.0000 mg | ORAL_TABLET | Freq: Two times a day (BID) | ORAL | 0 refills | Status: DC
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
## 2018-05-23 ENCOUNTER — Ambulatory Visit: Payer: Medicare Other | Admitting: Registered Nurse
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
## 2018-07-13 ENCOUNTER — Ambulatory Visit (INDEPENDENT_AMBULATORY_CARE_PROVIDER_SITE_OTHER): Payer: Medicare Other | Admitting: *Deleted

## 2018-07-13 VITALS — BP 138/82 | HR 62 | Resp 18 | Ht 72.0 in | Wt 278.0 lb

## 2018-07-13 DIAGNOSIS — Z0000 Encounter for general adult medical examination without abnormal findings: Secondary | ICD-10-CM | POA: Diagnosis not present

## 2018-07-13 NOTE — Patient Instructions (Addendum)
Continue doing brain stimulating activities (puzzles, reading, adult coloring books, staying active) to keep memory sharp.   Continue to eat heart healthy diet (full of fruits, vegetables, whole grains, lean protein, water--limit salt, fat, and sugar intake) and increase physical activity as tolerated.  Mark [name] , Thank you for taking time to come for your Medicare Wellness Visit. I appreciate your ongoing commitment to your health goals. Please review the following plan we discussed and let me know if I can assist you in the future.   These are the goals we discussed: Goals    . patient     Work and attend classes for GED; possibly go to [employer]     . Patient Stated     Lose weight by decreasing my portions and eating healthier. Start to walk 20 minutes a day.     . Patient Stated     Eat healthy and exercise.        This is a list of the screening recommended for you and due dates:  Health Maintenance  Topic Date Due  . HIV Screening  01/18/1982  . Flu Shot  01/27/2018  . Cologuard (Stool DNA test)  09/14/2020  . Tetanus Vaccine  12/22/2022     Health Maintenance, Male A healthy lifestyle and preventive care is important for your health and wellness. Ask your health care provider about what schedule of regular examinations is right for you. What should I know about weight and diet? Eat a Healthy Diet  Eat plenty of vegetables, fruits, whole grains, low-fat dairy products, and lean protein.  Do not eat a lot of foods high in solid fats, added sugars, or salt.  Maintain a Healthy Weight Regular exercise can help you achieve or maintain a healthy weight. You should:  Do at least 150 minutes of exercise each week. The exercise should increase your heart rate and make you sweat (moderate-intensity exercise).  Do strength-training exercises at least twice a week. Watch Your Levels of Cholesterol and Blood Lipids  Have your blood tested for lipids and  cholesterol every 5 years starting at 52 years of age. If you are at high risk for heart disease, you should start having your blood tested when you are 52 years old. You may need to have your cholesterol levels checked more often if: ? Your lipid or cholesterol levels are high. ? You are older than 52 years of age. ? You are at high risk for heart disease. What should I know about cancer screening? Many types of cancers can be detected early and may often be prevented. Lung Cancer  You should be screened every year for lung cancer if: ? You are a current smoker who has smoked for at least 30 years. ? You are a former smoker who has quit within the past 15 years.  Talk to your health care provider about your screening options, when you should start screening, and how often you should be screened. Colorectal Cancer  Routine colorectal cancer screening usually begins at 52 years of age and should be repeated every 5-10 years until you are 52 years old. You may need to be screened more often if early forms of precancerous polyps or small growths are found. Your health care provider may recommend screening at an earlier age if you have risk factors for colon cancer.  Your health care provider may recommend using home test kits to check for hidden blood in the stool.  A small camera  at the end of a tube can be used to examine your colon (sigmoidoscopy or colonoscopy). This checks for the earliest forms of colorectal cancer. Prostate and Testicular Cancer  Depending on your age and overall health, your health care provider may do certain tests to screen for prostate and testicular cancer.  Talk to your health care provider about any symptoms or concerns you have about testicular or prostate cancer. Skin Cancer  Check your skin from head to toe regularly.  Tell your health care provider about any new moles or changes in moles, especially if: ? There is a change in a mole's size, shape, or  color. ? You have a mole that is larger than a pencil eraser.  Always use sunscreen. Apply sunscreen liberally and repeat throughout the day.  Protect yourself by wearing long sleeves, pants, a wide-brimmed hat, and sunglasses when outside. What should I know about heart disease, diabetes, and high blood pressure?  If you are 83-55 years of age, have your blood pressure checked every 3-5 years. If you are 15 years of age or older, have your blood pressure checked every year. You should have your blood pressure measured twice-once when you are at a hospital or clinic, and once when you are not at a hospital or clinic. Record the average of the two measurements. To check your blood pressure when you are not at a hospital or clinic, you can use: ? An automated blood pressure machine at a pharmacy. ? A home blood pressure monitor.  Talk to your health care provider about your target blood pressure.  If you are between 67-37 years old, ask your health care provider if you should take aspirin to prevent heart disease.  Have regular diabetes screenings by checking your fasting blood sugar level. ? If you are at a normal weight and have a low risk for diabetes, have this test once every three years after the age of 47. ? If you are overweight and have a high risk for diabetes, consider being tested at a younger age or more often.  A one-time screening for abdominal aortic aneurysm (AAA) by ultrasound is recommended for men aged 71-75 years who are current or former smokers. What should I know about preventing infection? Hepatitis B If you have a higher risk for hepatitis B, you should be screened for this virus. Talk with your health care provider to find out if you are at risk for hepatitis B infection. Hepatitis C Blood testing is recommended for:  Everyone born from [birth year] through 1964.  Anyone with known risk factors for hepatitis C. Sexually Transmitted Diseases (STDs)  You should be  screened each year for STDs including gonorrhea and chlamydia if: ? You are sexually active and are younger than 52 years of age. ? You are older than 53 years of age and your health care provider tells you that you are at risk for this type of infection. ? Your sexual activity has changed since you were last screened and you are at an increased risk for chlamydia or gonorrhea. Ask your health care provider if you are at risk.  Talk with your health care provider about whether you are at high risk of being infected with HIV. Your health care provider may recommend a prescription medicine to help prevent HIV infection. What else can I do?  Schedule regular health, dental, and eye exams.  Stay current with your vaccines (immunizations).  Do not use any tobacco products, such as cigarettes, chewing tobacco,  and e-cigarettes. If you need help quitting, ask your health care provider.  Limit alcohol intake to no more than 2 drinks per day. One drink equals 12 ounces of beer, 5 ounces of Siana Panameno, or 1 ounces of hard liquor.  Do not use street drugs.  Do not share needles.  Ask your health care provider for help if you need support or information about quitting drugs.  Tell your health care provider if you often feel depressed.  Tell your health care provider if you have ever been abused or do not feel safe at home. This information is not intended to replace advice given to you by your health care provider. Make sure you discuss any questions you have with your health care provider. Document Released: 12/12/2007 Document Revised: 02/12/2016 Document Reviewed: 03/19/2015 Elsevier Interactive Patient Education  2019 [employer].

## 2018-07-18 ENCOUNTER — Telehealth: Payer: Self-pay | Admitting: *Deleted

## 2018-07-18 NOTE — Telephone Encounter (Signed)
Return Mr. Lamm Call,  He reports he is taking his Ritalin as prescribed. Also stated he has made an appointment with his PCP due to frequency and intensity of headaches, he reports. His scheduled appointment is on 07/20/2018.

## 2018-07-18 NOTE — Telephone Encounter (Signed)
Did not leave reason he called on VM.

## 2018-07-20 ENCOUNTER — Other Ambulatory Visit (INDEPENDENT_AMBULATORY_CARE_PROVIDER_SITE_OTHER): Payer: Medicare Other

## 2018-07-20 ENCOUNTER — Ambulatory Visit (INDEPENDENT_AMBULATORY_CARE_PROVIDER_SITE_OTHER): Payer: Medicare Other | Admitting: Internal Medicine

## 2018-07-20 ENCOUNTER — Other Ambulatory Visit: Payer: Medicare Other

## 2018-07-20 ENCOUNTER — Encounter: Payer: Self-pay | Admitting: Internal Medicine

## 2018-07-20 VITALS — BP 126/78 | HR 70 | Temp 97.9°F | Wt 276.0 lb

## 2018-07-20 DIAGNOSIS — G4452 New daily persistent headache (NDPH): Secondary | ICD-10-CM

## 2018-07-20 DIAGNOSIS — R519 Headache, unspecified: Secondary | ICD-10-CM

## 2018-07-20 DIAGNOSIS — R51 Headache: Secondary | ICD-10-CM

## 2018-07-20 DIAGNOSIS — G8929 Other chronic pain: Secondary | ICD-10-CM | POA: Insufficient documentation

## 2018-07-20 DIAGNOSIS — S069X5S Unspecified intracranial injury with loss of consciousness greater than 24 hours with return to pre-existing conscious level, sequela: Secondary | ICD-10-CM

## 2018-07-20 LAB — CBC WITH DIFFERENTIAL/PLATELET
BASOS PCT: 0.8 % (ref 0.0–3.0)
Basophils Absolute: 0 10*3/uL (ref 0.0–0.1)
Eosinophils Absolute: 0.2 10*3/uL (ref 0.0–0.7)
Eosinophils Relative: 4.6 % (ref 0.0–5.0)
HEMATOCRIT: 43.1 % (ref 39.0–52.0)
HEMOGLOBIN: 14.3 g/dL (ref 13.0–17.0)
Lymphocytes Relative: 44.9 % (ref 12.0–46.0)
Lymphs Abs: 1.9 10*3/uL (ref 0.7–4.0)
MCHC: 33.1 g/dL (ref 30.0–36.0)
MCV: 86.6 fl (ref 78.0–100.0)
MONOS PCT: 6.7 % (ref 3.0–12.0)
Monocytes Absolute: 0.3 10*3/uL (ref 0.1–1.0)
Neutro Abs: 1.8 10*3/uL (ref 1.4–7.7)
Neutrophils Relative %: 43 % (ref 43.0–77.0)
Platelets: 159 10*3/uL (ref 150.0–400.0)
RBC: 4.98 Mil/uL (ref 4.22–5.81)
RDW: 13.9 % (ref 11.5–15.5)
WBC: 4.2 10*3/uL (ref 4.0–10.5)

## 2018-07-20 LAB — COMPREHENSIVE METABOLIC PANEL
ALT: 16 U/L (ref 0–53)
AST: 15 U/L (ref 0–37)
Albumin: 4 g/dL (ref 3.5–5.2)
Alkaline Phosphatase: 49 U/L (ref 39–117)
BUN: 13 mg/dL (ref 6–23)
CO2: 30 mEq/L (ref 19–32)
Calcium: 9.5 mg/dL (ref 8.4–10.5)
Chloride: 103 mEq/L (ref 96–112)
Creatinine, Ser: 1 mg/dL (ref 0.40–1.50)
GFR: 95.13 mL/min (ref >60.00)
Glucose, Bld: 95 mg/dL (ref 70–99)
POTASSIUM: 4.9 meq/L (ref 3.5–5.1)
Sodium: 136 mEq/L (ref 135–145)
Total Bilirubin: 0.5 mg/dL (ref 0.2–1.2)
Total Protein: 7 g/dL (ref 6.0–8.3)

## 2018-07-20 LAB — SEDIMENTATION RATE: SED RATE: 10 mm/h (ref 0–20)

## 2018-07-20 MED ORDER — TOPIRAMATE 25 MG PO CPSP: 25.0000 mg | ORAL_CAPSULE | Freq: Two times a day (BID) | ORAL | 0 refills | Status: DC
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
## 2018-07-20 NOTE — Patient Instructions (Signed)
General Headache Without Cause A headache is pain or discomfort that is felt around the head or neck area. There are many causes and types of headaches. In some cases, the cause may not be found. Follow these instructions at home: Watch your condition for any changes. Let your doctor know about them. Take these steps to help with your condition: Managing pain      Take over-the-counter and prescription medicines only as told by your doctor.  Lie down in a dark, quiet room when you have a headache.  If told, put ice on your head and neck area: ? Put ice in a plastic bag. ? Place a towel between your skin and the bag. ? Leave the ice on for 20 minutes, 2-3 times per day.  If told, put heat on the affected area. Use the heat source that your doctor recommends, such as a moist heat pack or a heating pad. ? Place a towel between your skin and the heat source. ? Leave the heat on for 20-30 minutes. ? Remove the heat if your skin turns bright red. This is very important if you are unable to feel pain, heat, or cold. You may have a greater risk of getting burned.  Keep lights dim if bright lights bother you or make your headaches worse. Eating and drinking  Eat meals on a regular schedule.  If you drink alcohol: ? Limit how much you use to:  0-1 drink a day for women.  0-2 drinks a day for men. ? Be aware of how much alcohol is in your drink. In the [place] one drink equals one 12 oz bottle of beer (355 mL), one 5 oz glass of wine (148 mL), or one 1 oz glass of hard liquor (44 mL).  Stop drinking caffeine, or reduce how much caffeine you drink. General instructions   Keep a journal to find out if certain things bring on headaches. For example, write down: ? What you eat and drink. ? How much sleep you get. ? Any change to your diet or medicines.  Get a massage or try other ways to relax.  Limit stress.  Sit up straight. Do not tighten (tense) your muscles.  Do not use any  products that contain nicotine or tobacco. This includes cigarettes, e-cigarettes, and chewing tobacco. If you need help quitting, ask your doctor.  Exercise regularly as told by your doctor.  Get enough sleep. This often means 7-9 hours of sleep each night.  Keep all follow-up visits as told by your doctor. This is important. Contact a doctor if:  Your symptoms are not helped by medicine.  You have a headache that feels different than the other headaches.  You feel sick to your stomach (nauseous) or you throw up (vomit).  You have a fever. Get help right away if:  Your headache gets very bad quickly.  Your headache gets worse after a lot of physical activity.  You keep throwing up.  You have a stiff neck.  You have trouble seeing.  You have trouble speaking.  You have pain in the eye or ear.  Your muscles are weak or you lose muscle control.  You lose your balance or have trouble walking.  You feel like you will pass out (faint) or you pass out.  You are mixed up (confused).  You have a seizure. Summary  A headache is pain or discomfort that is felt around the head or neck area.  There are many causes and   types of headaches. In some cases, the cause may not be found.  Keep a journal to help find out what causes your headaches. Watch your condition for any changes. Let your doctor know about them.  Contact a doctor if you have a headache that is different from usual, or if your headache is not helped by medicine.  Get help right away if your headache gets very bad, you throw up, you have trouble seeing, you lose your balance, or you have a seizure. This information is not intended to replace advice given to you by your health care provider. Make sure you discuss any questions you have with your health care provider. Document Released: 03/24/2008 Document Revised: 01/03/2018 Document Reviewed: 01/03/2018 Elsevier Interactive Patient Education  2019 [employer]  

## 2018-07-22 ENCOUNTER — Ambulatory Visit (INDEPENDENT_AMBULATORY_CARE_PROVIDER_SITE_OTHER)
Admission: RE | Admit: 2018-07-22 | Discharge: 2018-07-22 | Disposition: A | Discharge status: Home / Self Care | Payer: Medicare Other | Source: Ambulatory Visit | Attending: Internal Medicine | Admitting: Internal Medicine

## 2018-07-22 ENCOUNTER — Other Ambulatory Visit: Payer: Self-pay | Admitting: Internal Medicine

## 2018-07-22 DIAGNOSIS — G4452 New daily persistent headache (NDPH): Secondary | ICD-10-CM | POA: Diagnosis not present

## 2018-07-22 DIAGNOSIS — J323 Chronic sphenoidal sinusitis: Secondary | ICD-10-CM | POA: Insufficient documentation

## 2018-07-22 DIAGNOSIS — R51 Headache: Secondary | ICD-10-CM

## 2018-07-22 DIAGNOSIS — G8929 Other chronic pain: Secondary | ICD-10-CM

## 2018-07-22 DIAGNOSIS — S0190XA Unspecified open wound of unspecified part of head, initial encounter: Secondary | ICD-10-CM | POA: Diagnosis not present

## 2018-07-22 DIAGNOSIS — S069X5S Unspecified intracranial injury with loss of consciousness greater than 24 hours with return to pre-existing conscious level, sequela: Secondary | ICD-10-CM

## 2018-07-22 MED ORDER — AMOXICILLIN-POT CLAVULANATE 875-125 MG PO TABS: 1.0000 | ORAL_TABLET | Freq: Two times a day (BID) | ORAL | 0 refills | Status: AC

## 2018-08-03 ENCOUNTER — Telehealth: Payer: Self-pay | Admitting: *Deleted

## 2018-08-03 ENCOUNTER — Ambulatory Visit: Payer: Medicare Other | Admitting: Internal Medicine

## 2018-08-03 DIAGNOSIS — S069X5S Unspecified intracranial injury with loss of consciousness greater than 24 hours with return to pre-existing conscious level, sequela: Secondary | ICD-10-CM

## 2018-08-03 DIAGNOSIS — F32A Depression, unspecified: Secondary | ICD-10-CM

## 2018-08-03 DIAGNOSIS — F329 Major depressive disorder, single episode, unspecified: Secondary | ICD-10-CM

## 2018-08-03 DIAGNOSIS — G44309 Post-traumatic headache, unspecified, not intractable: Secondary | ICD-10-CM

## 2018-08-03 MED ORDER — CITALOPRAM HYDROBROMIDE 40 MG PO TABS: 40.0000 mg | ORAL_TABLET | Freq: Every day | ORAL | 6 refills | Status: DC

## 2018-08-03 MED ORDER — METHYLPHENIDATE HCL 10 MG PO TABS: 15.0000 mg | ORAL_TABLET | Freq: Two times a day (BID) | ORAL | 0 refills | Status: DC

## 2018-08-03 NOTE — Telephone Encounter (Signed)
Ritalin prescription sent to pharmacy. Placed a call to Mark [name] regarding the above, he verbalizes understanding.

## 2018-08-03 NOTE — Telephone Encounter (Signed)
Mark [name] called and says that he has 5 ritalin tablets left.  According to PMP he filled his last rx: 07/05/18

## 2018-08-08 ENCOUNTER — Ambulatory Visit: Payer: Self-pay | Admitting: *Deleted

## 2018-08-08 ENCOUNTER — Other Ambulatory Visit: Payer: Self-pay | Admitting: Internal Medicine

## 2018-08-08 DIAGNOSIS — G44321 Chronic post-traumatic headache, intractable: Secondary | ICD-10-CM

## 2018-08-08 MED ORDER — TOPIRAMATE 50 MG PO TABS: 50.0000 mg | ORAL_TABLET | Freq: Two times a day (BID) | ORAL | 1 refills | Status: DC

## 2018-08-08 NOTE — Telephone Encounter (Signed)
New RX sent

## 2018-08-08 NOTE — Telephone Encounter (Signed)
Pt is wanting to double his current dose of topimax. Please advise if okay for pt to do this.

## 2018-08-08 NOTE — Telephone Encounter (Signed)
Left detailed message for pt informing new rx and sig has been sent.
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
## 2018-08-10 ENCOUNTER — Ambulatory Visit (INDEPENDENT_AMBULATORY_CARE_PROVIDER_SITE_OTHER): Payer: Medicare Other | Admitting: Neurology

## 2018-08-10 ENCOUNTER — Encounter: Payer: Self-pay | Admitting: Neurology

## 2018-08-10 VITALS — BP 124/74 | HR 75 | Ht 72.0 in | Wt 272.0 lb

## 2018-08-10 DIAGNOSIS — G44021 Chronic cluster headache, intractable: Secondary | ICD-10-CM

## 2018-08-10 MED ORDER — TOPIRAMATE 50 MG PO TABS: 50.0000 mg | ORAL_TABLET | Freq: Two times a day (BID) | ORAL | 6 refills | Status: DC

## 2018-08-10 NOTE — Patient Instructions (Signed)
Cluster Headache A cluster headache is a type of headache that causes deep, intense head pain. Cluster headaches can last from 15 minutes to 3 hours. They usually occur:  On one side of the head. They may occur on the other side when a new cluster of headaches begins.  Repeatedly over weeks to months.  Several times a day.  At the same time of day, often at night.  More often in the fall and springtime. What are the causes? The cause of this condition is not known. What increases the risk? This condition is more likely to develop in:  Males.  People who drink alcohol.  People who smoke or use products that contain nicotine or tobacco.  People who take medicines that cause blood vessels to expand, such as nitroglycerin.  People who take antihistamines. What are the signs or symptoms? Symptoms of this condition include:  Severe pain on one side of the head that begins behind or around your eye or temple.  Pain on one side of the head.  Nausea.  Sensitivity to light.  Runny nose and nasal stuffiness.  Sweaty, pale skin on the face.  Droopy or swollen eyelid, eye redness, or tearing.  Restlessness and agitation. How is this diagnosed? This condition may be diagnosed based on:  Your symptoms.  A physical exam. Your health care provider may order tests to see if your headaches are caused by another medical condition. These tests may show that you do not have cluster headaches. Tests may include:  A CT scan of your head.  An MRI of your head.  Lab tests. How is this treated? This condition may be treated with:  Medicines to relieve pain and to prevent repeated (recurrent) attacks. Some people may need a combination of medicines.  Oxygen. This helps to relieve pain. Follow these instructions at home: Headache diary Keep a headache diary as told by your health care provider. Doing this can help you and your health care provider figure out what triggers your  headaches. In your headache diary, include information about:  The time of day that your headache started and what you were doing when it began.  How long your headache lasted.  Where your pain started and whether it moved to other areas.  The type of pain, such as burning, stabbing, throbbing, or cramping.  Your level of pain. Use a pain scale and rate the pain with a number from 1 (mild) up to 10 (severe).  The treatment that you used, and any change in symptoms after treatment.  Medicines  Take over-the-counter and prescription medicines only as told by your health care provider.  Do not drive or use heavy machinery while taking prescription pain medicine.  Use oxygen as told by your health care provider. Lifestyle  Follow a regular sleep schedule. Do not vary the time that you go to bed or the amount that you sleep from day to day. It is important to stay on the same schedule during a cluster period to help prevent headaches.  Exercise regularly.  Eat a healthy diet and avoid foods that may trigger your headaches.  Avoid alcohol.  Do not use any products that contain nicotine or tobacco, such as cigarettes and e-cigarettes. If you need help quitting, ask your health care provider. Contact a health care provider if:  Your headaches change, become more severe, or occur more often.  The medicine or oxygen that your health care provider recommended does not help. Get help right away   if:  You faint.  You have weakness or numbness, especially on one side of your body or face.  You have double vision.  You have nausea or vomiting that does not go away within several hours.  You have trouble talking, walking, or keeping your balance.  You have pain or stiffness in your neck.  You have a fever. Summary  A cluster headache is a type of headache that causes deep, intense head pain, usually on one side of the head.  Keep a headache diary to help discover what triggers  your headaches.  A regular sleep schedule can help prevent headaches. This information is not intended to replace advice given to you by your health care provider. Make sure you discuss any questions you have with your health care provider. Document Released: 06/15/2005 Document Revised: 02/25/2016 Document Reviewed: 02/25/2016 Elsevier Interactive Patient Education  2019 [place].  Topiramate tablets What is this medicine? TOPIRAMATE (toe PYRE a mate) is used to treat seizures in adults or children with epilepsy. It is also used for the prevention of migraine headaches. This medicine may be used for other purposes; ask your health care provider or pharmacist if you have questions. COMMON BRAND NAME(S): Topamax, Topiragen What should I tell my health care provider before I take this medicine? They need to know if you have any of these conditions: -bleeding disorders -cirrhosis of the liver or liver disease -diarrhea -glaucoma -kidney stones or kidney disease -low blood counts, like low white cell, platelet, or red cell counts -lung disease like asthma, obstructive pulmonary disease, emphysema -metabolic acidosis -on a ketogenic diet -schedule for surgery or a procedure -suicidal thoughts, plans, or attempt; a previous suicide attempt by you or a family member -an unusual or allergic reaction to topiramate, other medicines, foods, dyes, or preservatives -pregnant or trying to get pregnant -breast-feeding How should I use this medicine? Take this medicine by mouth with a glass of water. Follow the directions on the prescription label. Do not crush or chew. You may take this medicine with meals. Take your medicine at regular intervals. Do not take it more often than directed. Talk to your pediatrician regarding the use of this medicine in children. Special care may be needed. While this drug may be prescribed for children as young as 39 years of age for selected conditions, precautions  do apply. Overdosage: If you think you have taken too much of this medicine contact a poison control center or emergency room at once. NOTE: This medicine is only for you. Do not share this medicine with others. What if I miss a dose? If you miss a dose, take it as soon as you can. If your next dose is to be taken in less than 6 hours, then do not take the missed dose. Take the next dose at your regular time. Do not take double or extra doses. What may interact with this medicine? Do not take this medicine with any of the following medications: -probenecid This medicine may also interact with the following medications: -acetazolamide -alcohol -amitriptyline -aspirin and aspirin-like medicines -birth control pills -certain medicines for depression -certain medicines for seizures -certain medicines that treat or prevent blood clots like warfarin, enoxaparin, dalteparin, apixaban, dabigatran, and rivaroxaban -digoxin -hydrochlorothiazide -lithium -medicines for pain, sleep, or muscle relaxation -metformin -methazolamide -NSAIDS, medicines for pain and inflammation, like ibuprofen or naproxen -pioglitazone -risperidone This list may not describe all possible interactions. Give your health care provider a list of all the medicines, herbs, non-prescription  drugs, or dietary supplements you use. Also tell them if you smoke, drink alcohol, or use illegal drugs. Some items may interact with your medicine. What should I watch for while using this medicine? Visit your doctor or health care professional for regular checks on your progress. Do not stop taking this medicine suddenly. This increases the risk of seizures if you are using this medicine to control epilepsy. Wear a medical identification bracelet or chain to say you have epilepsy or seizures, and carry a card that lists all your medicines. This medicine can decrease sweating and increase your body temperature. Watch for signs of deceased  sweating or fever, especially in children. Avoid extreme heat, hot baths, and saunas. Be careful about exercising, especially in hot weather. Contact your health care provider right away if you notice a fever or decrease in sweating. You should drink plenty of fluids while taking this medicine. If you have had kidney stones in the past, this will help to reduce your chances of forming kidney stones. If you have stomach pain, with nausea or vomiting and yellowing of your eyes or skin, call your doctor immediately. You may get drowsy, dizzy, or have blurred vision. Do not drive, use machinery, or do anything that needs mental alertness until you know how this medicine affects you. To reduce dizziness, do not sit or stand up quickly, especially if you are an older patient. Alcohol can increase drowsiness and dizziness. Avoid alcoholic drinks. If you notice blurred vision, eye pain, or other eye problems, seek medical attention at once for an eye exam. The use of this medicine may increase the chance of suicidal thoughts or actions. Pay special attention to how you are responding while on this medicine. Any worsening of mood, or thoughts of suicide or dying should be reported to your health care professional right away. This medicine may increase the chance of developing metabolic acidosis. If left untreated, this can cause kidney stones, bone disease, or slowed growth in children. Symptoms include breathing fast, fatigue, loss of appetite, irregular heartbeat, or loss of consciousness. Call your doctor immediately if you experience any of these side effects. Also, tell your doctor about any surgery you plan on having while taking this medicine since this may increase your risk for metabolic acidosis. Birth control pills may not work properly while you are taking this medicine. Talk to your doctor about using an extra method of birth control. Women who become pregnant while using this medicine may enroll in the  [place] Pregnancy Registry by calling [phone]. This registry collects information about the safety of antiepileptic drug use during pregnancy. What side effects may I notice from receiving this medicine? Side effects that you should report to your doctor or health care professional as soon as possible: -allergic reactions like skin rash, itching or hives, swelling of the face, lips, or tongue -decreased sweating and/or rise in body temperature -depression -difficulty breathing, fast or irregular breathing patterns -difficulty speaking -difficulty walking or controlling muscle movements -hearing impairment -redness, blistering, peeling or loosening of the skin, including inside the mouth -tingling, pain or numbness in the hands or feet -unusual bleeding or bruising -unusually weak or tired -worsening of mood, thoughts or actions of suicide or dying Side effects that usually do not require medical attention (report to your doctor or health care professional if they continue or are bothersome): -altered taste -back pain, joint or muscle aches and pains -diarrhea, or constipation -headache -loss of appetite -nausea -stomach upset,  indigestion -tremors This list may not describe all possible side effects. Call your doctor for medical advice about side effects. You may report side effects to FDA at 1-800-FDA-1088. Where should I keep my medicine? Keep out of the reach of children. Store at room temperature between 15 and 30 degrees C (59 and 86 degrees F) in a tightly closed container. Protect from moisture. Throw away any unused medicine after the expiration date. NOTE: This sheet is a summary. It may not cover all possible information. If you have questions about this medicine, talk to your doctor, pharmacist, or health care provider.  2019 Elsevier/Gold Standard (2013-06-19 23:17:57)
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
## 2018-08-15 ENCOUNTER — Ambulatory Visit (INDEPENDENT_AMBULATORY_CARE_PROVIDER_SITE_OTHER): Payer: Medicare Other | Admitting: Internal Medicine

## 2018-08-15 ENCOUNTER — Other Ambulatory Visit (INDEPENDENT_AMBULATORY_CARE_PROVIDER_SITE_OTHER): Payer: Medicare Other

## 2018-08-15 ENCOUNTER — Encounter: Payer: Self-pay | Admitting: Internal Medicine

## 2018-08-15 VITALS — BP 120/84 | HR 69 | Temp 97.5°F | Ht 72.0 in | Wt 269.1 lb

## 2018-08-15 DIAGNOSIS — E785 Hyperlipidemia, unspecified: Secondary | ICD-10-CM | POA: Diagnosis not present

## 2018-08-15 DIAGNOSIS — N400 Enlarged prostate without lower urinary tract symptoms: Secondary | ICD-10-CM | POA: Diagnosis not present

## 2018-08-15 DIAGNOSIS — Z114 Encounter for screening for human immunodeficiency virus [HIV]: Secondary | ICD-10-CM | POA: Diagnosis not present

## 2018-08-15 DIAGNOSIS — N2889 Other specified disorders of kidney and ureter: Secondary | ICD-10-CM

## 2018-08-15 DIAGNOSIS — Z0000 Encounter for general adult medical examination without abnormal findings: Secondary | ICD-10-CM

## 2018-08-15 LAB — LIPID PANEL
Cholesterol: 144 mg/dL (ref 0–200)
HDL: 24.7 mg/dL — ABNORMAL LOW (ref >39.00)
LDL Cholesterol: 100 mg/dL — ABNORMAL HIGH (ref 0–99)
NonHDL: 119.65
Total CHOL/HDL Ratio: 6
Triglycerides: 98 mg/dL (ref 0.0–149.0)
VLDL: 19.6 mg/dL (ref 0.0–40.0)

## 2018-08-15 LAB — COMPREHENSIVE METABOLIC PANEL
ALT: 20 U/L (ref 0–53)
AST: 15 U/L (ref 0–37)
Albumin: 3.9 g/dL (ref 3.5–5.2)
Alkaline Phosphatase: 62 U/L (ref 39–117)
BUN: 16 mg/dL (ref 6–23)
CO2: 27 meq/L (ref 19–32)
Calcium: 9 mg/dL (ref 8.4–10.5)
Chloride: 109 mEq/L (ref 96–112)
Creatinine, Ser: 1.08 mg/dL (ref 0.40–1.50)
GFR: 87.02 mL/min (ref >60.00)
Glucose, Bld: 81 mg/dL (ref 70–99)
Potassium: 4.3 mEq/L (ref 3.5–5.1)
Sodium: 141 mEq/L (ref 135–145)
Total Bilirubin: 0.4 mg/dL (ref 0.2–1.2)
Total Protein: 6.8 g/dL (ref 6.0–8.3)
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
## 2018-08-15 NOTE — Patient Instructions (Signed)

## 2018-08-16 LAB — HIV ANTIBODY (ROUTINE TESTING W REFLEX): HIV 1&2 Ab, 4th Generation: NONREACTIVE

## 2018-08-17 ENCOUNTER — Encounter: Payer: Self-pay | Admitting: Internal Medicine

## 2018-08-17 LAB — PSA: PSA: 1.19 ng/mL (ref 0.10–4.00)

## 2018-08-17 NOTE — Assessment & Plan Note (Signed)

## 2018-08-24 DIAGNOSIS — J329 Chronic sinusitis, unspecified: Secondary | ICD-10-CM | POA: Diagnosis not present

## 2018-08-29 ENCOUNTER — Other Ambulatory Visit: Payer: Self-pay | Admitting: Physical Medicine & Rehabilitation

## 2018-08-29 DIAGNOSIS — F329 Major depressive disorder, single episode, unspecified: Secondary | ICD-10-CM

## 2018-08-29 DIAGNOSIS — F32A Depression, unspecified: Secondary | ICD-10-CM

## 2018-09-06 ENCOUNTER — Encounter: Payer: Self-pay | Admitting: Internal Medicine

## 2018-09-06 ENCOUNTER — Ambulatory Visit (INDEPENDENT_AMBULATORY_CARE_PROVIDER_SITE_OTHER)
Admission: RE | Admit: 2018-09-06 | Discharge: 2018-09-06 | Disposition: A | Discharge status: Home / Self Care | Payer: Medicare Other | Source: Ambulatory Visit | Attending: Internal Medicine | Admitting: Internal Medicine

## 2018-09-06 DIAGNOSIS — N2889 Other specified disorders of kidney and ureter: Secondary | ICD-10-CM

## 2018-09-06 DIAGNOSIS — N200 Calculus of kidney: Secondary | ICD-10-CM | POA: Diagnosis not present

## 2018-09-06 MED ORDER — IOPAMIDOL (ISOVUE-300) INJECTION 61%
100.0000 mL | Freq: Once | INTRAVENOUS | Status: AC | PRN
  Administered 2018-09-06: 100 mL via INTRAVENOUS

## 2018-09-14 ENCOUNTER — Encounter: Payer: Self-pay | Admitting: Physical Medicine & Rehabilitation

## 2018-09-14 ENCOUNTER — Other Ambulatory Visit: Payer: Self-pay

## 2018-09-14 ENCOUNTER — Encounter: Payer: Medicare Other | Attending: Physical Medicine & Rehabilitation | Admitting: Physical Medicine & Rehabilitation

## 2018-09-14 VITALS — BP 123/80 | HR 75 | Ht 72.0 in | Wt 263.6 lb

## 2018-09-14 DIAGNOSIS — F908 Attention-deficit hyperactivity disorder, other type: Secondary | ICD-10-CM

## 2018-09-14 DIAGNOSIS — F329 Major depressive disorder, single episode, unspecified: Secondary | ICD-10-CM | POA: Diagnosis not present

## 2018-09-14 DIAGNOSIS — G44309 Post-traumatic headache, unspecified, not intractable: Secondary | ICD-10-CM | POA: Diagnosis not present

## 2018-09-14 DIAGNOSIS — S069X5S Unspecified intracranial injury with loss of consciousness greater than 24 hours with return to pre-existing conscious level, sequela: Secondary | ICD-10-CM

## 2018-09-14 MED ORDER — METHYLPHENIDATE HCL 10 MG PO TABS: 15.0000 mg | ORAL_TABLET | Freq: Two times a day (BID) | ORAL | 0 refills | Status: DC

## 2018-09-14 MED ORDER — TOPIRAMATE 50 MG PO TABS: 50.0000 mg | ORAL_TABLET | Freq: Every day | ORAL | 6 refills | Status: DC

## 2018-09-14 NOTE — Patient Instructions (Signed)
PLEASE FEEL FREE TO CALL OUR OFFICE WITH ANY PROBLEMS OR QUESTIONS ([phone])      
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
## 2018-11-01 ENCOUNTER — Ambulatory Visit (INDEPENDENT_AMBULATORY_CARE_PROVIDER_SITE_OTHER): Payer: Medicare Other | Admitting: Internal Medicine

## 2018-11-01 ENCOUNTER — Encounter: Payer: Self-pay | Admitting: Internal Medicine

## 2018-11-01 DIAGNOSIS — J323 Chronic sphenoidal sinusitis: Secondary | ICD-10-CM

## 2018-11-01 DIAGNOSIS — J301 Allergic rhinitis due to pollen: Secondary | ICD-10-CM | POA: Diagnosis not present

## 2018-11-01 MED ORDER — AZELASTINE-FLUTICASONE 137-50 MCG/ACT NA SUSP: 1.0000 | Freq: Two times a day (BID) | NASAL | 5 refills | Status: DC

## 2018-11-01 MED ORDER — METHYLPREDNISOLONE 4 MG PO TBPK: ORAL_TABLET | ORAL | 0 refills | Status: AC

## 2018-11-01 MED ORDER — LEVOCETIRIZINE DIHYDROCHLORIDE 5 MG PO TABS: 5.0000 mg | ORAL_TABLET | Freq: Every evening | ORAL | 1 refills | Status: DC

## 2018-11-01 NOTE — Progress Notes (Signed)
Virtual Visit via Video Note  I connected with Mark [name] on 11/01/18 at  4:15 PM EDT by a video enabled telemedicine application and verified that I am speaking with the correct person using two identifiers.   I discussed the limitations of evaluation and management by telemedicine and the availability of in person appointments. The patient expressed understanding and agreed to proceed.  History of Present Illness: He checked in for a virtual visit.  He was not willing to come in for an in person visit due to the COVID-19 pandemic.  He complains of a one-week history of nasal congestion and runny nose.  The symptoms are more prominent on the left than the right.  The symptoms started after he was exposed to RAID bug spray that was aerosolized.  He has not made any attempt to treat the symptoms.  He denies headache, facial pain, fever, chills, facial swelling, nausea, vomiting, earache, or sore throat.    Observations/Objective: No distress.  No facial swelling.  Normal speech with no trismus or stridor.  He was calm, cooperative, and appropriate.  Lab Results  Component Value Date   WBC 4.2 07/20/2018   HGB 14.3 07/20/2018   HCT 43.1 07/20/2018   PLT 159.0 07/20/2018   GLUCOSE 81 08/15/2018   CHOL 144 08/15/2018   TRIG 98.0 08/15/2018   HDL 24.70 (L) 08/15/2018   LDLCALC 100 (H) 08/15/2018   ALT 20 08/15/2018   AST 15 08/15/2018   NA 141 08/15/2018   K 4.3 08/15/2018   CL 109 08/15/2018   CREATININE 1.08 08/15/2018   BUN 16 08/15/2018   CO2 27 08/15/2018   TSH 0.95 05/06/2016   PSA 1.19 08/15/2018   INR 1.02 09/28/2011   HGBA1C 5.5 05/06/2016     Assessment and Plan: His symptoms and exam are consistent with allergic rhinitis.  I am not sure if this is seasonal or if it is related to the irritant component in the RAID bug spray.  I think his symptoms are severe enough that he would benefit from a 6-day course of methylprednisolone.  I have also asked him to start taking an  oral antihistamine and to use a combination nasal spray (antihistamine/steroid).   Follow Up Instructions: He will let me know if he develops any new or worsening symptoms.  He agrees to use the above recommendations to treat the symptoms.  He will avoid further exposure to pollen and the irritant in the bug spray.   I discussed the assessment and treatment plan with the patient. The patient was provided an opportunity to ask questions and all were answered. The patient agreed with the plan and demonstrated an understanding of the instructions.   The patient was advised to call back or seek an in-person evaluation if the symptoms worsen or if the condition fails to improve as anticipated.  I provided 25 minutes of non-face-to-face time during this encounter.   Scarlette Calico, MD

## 2018-11-02 ENCOUNTER — Encounter: Payer: Self-pay | Admitting: Internal Medicine

## 2018-11-16 ENCOUNTER — Encounter: Payer: Medicare Other | Attending: Physical Medicine & Rehabilitation | Admitting: Physical Medicine & Rehabilitation

## 2018-11-16 ENCOUNTER — Other Ambulatory Visit: Payer: Self-pay

## 2018-11-16 ENCOUNTER — Encounter: Payer: Self-pay | Admitting: Physical Medicine & Rehabilitation

## 2018-11-16 VITALS — BP 115/80 | HR 74 | Temp 98.4°F | Ht 72.0 in | Wt 263.0 lb

## 2018-11-16 DIAGNOSIS — G8110 Spastic hemiplegia affecting unspecified side: Secondary | ICD-10-CM

## 2018-11-16 DIAGNOSIS — F908 Attention-deficit hyperactivity disorder, other type: Secondary | ICD-10-CM | POA: Diagnosis not present

## 2018-11-16 DIAGNOSIS — Z5181 Encounter for therapeutic drug level monitoring: Secondary | ICD-10-CM

## 2018-11-16 DIAGNOSIS — G44309 Post-traumatic headache, unspecified, not intractable: Secondary | ICD-10-CM | POA: Diagnosis not present

## 2018-11-16 DIAGNOSIS — Z79899 Other long term (current) drug therapy: Secondary | ICD-10-CM | POA: Diagnosis not present

## 2018-11-16 DIAGNOSIS — F329 Major depressive disorder, single episode, unspecified: Secondary | ICD-10-CM | POA: Diagnosis not present

## 2018-11-16 DIAGNOSIS — S069X5S Unspecified intracranial injury with loss of consciousness greater than 24 hours with return to pre-existing conscious level, sequela: Secondary | ICD-10-CM

## 2018-11-16 MED ORDER — PROPRANOLOL HCL 10 MG PO TABS: 10.0000 mg | ORAL_TABLET | Freq: Three times a day (TID) | ORAL | 3 refills | Status: DC

## 2018-11-16 MED ORDER — METHYLPHENIDATE HCL 10 MG PO TABS: 15.0000 mg | ORAL_TABLET | Freq: Two times a day (BID) | ORAL | 0 refills | Status: DC

## 2018-11-16 NOTE — Patient Instructions (Signed)
PROPRANOLOL IS FOR HEADACHE PREVENTION  TAKE 10MG  TWICE DAILY FOR ONE WEEK THEN INCREASE TO THREE X  A DAY.     IF YOU ARE TOLERATING THE MEDICINE BUT IT'S NOT DOING MUCH FOR YOUR HEADACHE, THEN CALL ME, AND WE'LL INCREASE THE DOSE.
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
## 2018-11-20 LAB — TOXASSURE SELECT,+ANTIDEPR,UR

## 2018-11-22 ENCOUNTER — Telehealth: Payer: Self-pay | Admitting: *Deleted

## 2018-11-22 NOTE — Telephone Encounter (Signed)
Urine drug screen for this encounter is consistent for prescribed medication 

## 2019-01-11 ENCOUNTER — Other Ambulatory Visit: Payer: Self-pay

## 2019-01-11 ENCOUNTER — Encounter: Payer: Medicare Other | Attending: Physical Medicine & Rehabilitation | Admitting: Physical Medicine & Rehabilitation

## 2019-01-11 ENCOUNTER — Encounter: Payer: Self-pay | Admitting: Physical Medicine & Rehabilitation

## 2019-01-11 VITALS — BP 136/82 | HR 69 | Temp 96.7°F | Ht 72.0 in | Wt 265.0 lb

## 2019-01-11 DIAGNOSIS — J3489 Other specified disorders of nose and nasal sinuses: Secondary | ICD-10-CM | POA: Diagnosis not present

## 2019-01-11 DIAGNOSIS — S069X5S Unspecified intracranial injury with loss of consciousness greater than 24 hours with return to pre-existing conscious level, sequela: Secondary | ICD-10-CM | POA: Diagnosis not present

## 2019-01-11 DIAGNOSIS — G44309 Post-traumatic headache, unspecified, not intractable: Secondary | ICD-10-CM | POA: Diagnosis not present

## 2019-01-11 DIAGNOSIS — F908 Attention-deficit hyperactivity disorder, other type: Secondary | ICD-10-CM | POA: Insufficient documentation

## 2019-01-11 DIAGNOSIS — F329 Major depressive disorder, single episode, unspecified: Secondary | ICD-10-CM | POA: Insufficient documentation

## 2019-01-11 DIAGNOSIS — G44321 Chronic post-traumatic headache, intractable: Secondary | ICD-10-CM | POA: Diagnosis not present

## 2019-01-11 MED ORDER — METHYLPHENIDATE HCL 10 MG PO TABS: 15.0000 mg | ORAL_TABLET | Freq: Two times a day (BID) | ORAL | 0 refills | Status: DC

## 2019-01-11 MED ORDER — PROPRANOLOL HCL 20 MG PO TABS: 10.0000 mg | ORAL_TABLET | Freq: Three times a day (TID) | ORAL | 3 refills | Status: DC
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
## 2019-01-11 NOTE — Patient Instructions (Signed)
PLEASE FEEL FREE TO CALL OUR OFFICE WITH ANY PROBLEMS OR QUESTIONS ([phone])     DOUBLE UP ON THE 10MG  PROPRANOLOL TABS UNTIL THEY'RE GONE---THEN START THE 20MG  TABS   CALL DR. Ronnald Ramp ABOUT OPINION REGARDING YOUR NOSE

## 2019-02-08 ENCOUNTER — Ambulatory Visit: Payer: Medicare Other | Admitting: Neurology

## 2019-02-14 ENCOUNTER — Other Ambulatory Visit: Payer: Self-pay

## 2019-02-14 ENCOUNTER — Ambulatory Visit (INDEPENDENT_AMBULATORY_CARE_PROVIDER_SITE_OTHER): Payer: Medicare Other | Admitting: Internal Medicine

## 2019-02-14 ENCOUNTER — Encounter: Payer: Self-pay | Admitting: Internal Medicine

## 2019-02-14 VITALS — BP 130/80 | HR 61 | Temp 98.3°F | Resp 16 | Ht 72.0 in | Wt 268.0 lb

## 2019-02-14 DIAGNOSIS — J301 Allergic rhinitis due to pollen: Secondary | ICD-10-CM

## 2019-02-14 MED ORDER — LEVOCETIRIZINE DIHYDROCHLORIDE 5 MG PO TABS: 5.0000 mg | ORAL_TABLET | Freq: Every evening | ORAL | 1 refills | Status: DC

## 2019-02-14 NOTE — Patient Instructions (Signed)

## 2019-02-14 NOTE — Progress Notes (Signed)
Subjective:  Patient ID: Mark [name], male    DOB: 04-21-1966  Age: 53 y.o. MRN: [id number]  CC: Allergic Rhinitis    HPI Mark [name] presents for f/up - He returns for follow-up on nasal allergies.  He says his runny nose is getting much better.  He is keeping a daily log and he writes down every time his nose runs.  It has gotten much better over the last few weeks.  He is taking the oral antihistamine but is not using the nasal spray because he does not think he needs it.  Outpatient Medications Prior to Visit  Medication Sig Dispense Refill   citalopram (CELEXA) 40 MG tablet TAKE 1 TABLET(40 MG) BY MOUTH DAILY 30 tablet 6   methylphenidate (RITALIN) 10 MG tablet Take 1.5 tablets (15 mg total) by mouth 2 (two) times daily with breakfast and lunch. At 0700 and 1200 daily 90 tablet 0   Multiple Vitamins-Minerals (MULTIVITAMIN WITH MINERALS) tablet Take 1 tablet by mouth daily.     propranolol (INDERAL) 20 MG tablet Take 0.5 tablets (10 mg total) by mouth 3 (three) times daily. 90 tablet 3   topiramate (TOPAMAX) 50 MG tablet Take 1 tablet (50 mg total) by mouth at bedtime. 30 tablet 6   levocetirizine (XYZAL) 5 MG tablet Take 1 tablet (5 mg total) by mouth every evening. 90 tablet 1   Azelastine-Fluticasone (DYMISTA) 137-50 MCG/ACT SUSP Place 1 Act into the nose 2 (two) times a day. (Patient not taking: Reported on 02/14/2019) 23 g 5   No facility-administered medications prior to visit.     ROS Review of Systems  Constitutional: Negative.   HENT: Positive for postnasal drip and rhinorrhea. Negative for congestion, nosebleeds, sinus pressure, sneezing, sore throat and trouble swallowing.   Respiratory: Negative for cough, chest tightness, shortness of breath and wheezing.   Cardiovascular: Negative for chest pain, palpitations and leg swelling.  Gastrointestinal: Negative for abdominal pain, diarrhea, nausea and vomiting.  Endocrine: Negative.   Genitourinary: Negative.   Negative for difficulty urinating.  Musculoskeletal: Negative.  Negative for arthralgias and myalgias.  Skin: Negative.   Neurological: Negative.  Negative for dizziness, weakness and light-headedness.  Hematological: Negative for adenopathy. Does not bruise/bleed easily.  Psychiatric/Behavioral: Negative.     Objective:  BP 130/80 (BP Location: Left Arm, Patient Position: Sitting, Cuff Size: Large)    Pulse 61    Temp 98.3 F (36.8 C) (Oral)    Resp 16    Ht 6' (1.829 m)    Wt 268 lb (121.6 kg)    SpO2 99%    BMI 36.35 kg/m   BP Readings from Last 3 Encounters:  02/14/19 130/80  01/11/19 136/82  11/16/18 115/80    Wt Readings from Last 3 Encounters:  02/14/19 268 lb (121.6 kg)  01/11/19 265 lb (120.2 kg)  11/16/18 263 lb (119.3 kg)    Physical Exam Vitals signs reviewed.  Constitutional:      General: He is not in acute distress.    Appearance: He is obese. He is not ill-appearing, toxic-appearing or diaphoretic.  HENT:     Nose: No mucosal edema, congestion or rhinorrhea.     Right Nostril: No epistaxis.     Left Nostril: No epistaxis.     Right Turbinates: Not enlarged, swollen or pale.     Left Turbinates: Not enlarged, swollen or pale.     Right Sinus: No maxillary sinus tenderness or frontal sinus tenderness.     Left Sinus: No  maxillary sinus tenderness or frontal sinus tenderness.     Mouth/Throat:     Pharynx: No oropharyngeal exudate.  Eyes:     General: No scleral icterus.    Conjunctiva/sclera: Conjunctivae normal.  Neck:     Musculoskeletal: Normal range of motion. No neck rigidity or muscular tenderness.  Cardiovascular:     Rate and Rhythm: Normal rate and regular rhythm.     Heart sounds: No murmur.  Pulmonary:     Effort: Pulmonary effort is normal. No respiratory distress.     Breath sounds: No stridor. No wheezing, rhonchi or rales.  Abdominal:     General: Abdomen is flat. Bowel sounds are normal. There is no distension.     Palpations: There  is no hepatomegaly or splenomegaly.     Tenderness: There is no abdominal tenderness.  Musculoskeletal: Normal range of motion.  Lymphadenopathy:     Cervical: No cervical adenopathy.  Skin:    General: Skin is warm and dry.  Neurological:     General: No focal deficit present.     Mental Status: He is alert.  Psychiatric:        Mood and Affect: Mood normal.        Behavior: Behavior normal.     Lab Results  Component Value Date   WBC 4.2 07/20/2018   HGB 14.3 07/20/2018   HCT 43.1 07/20/2018   PLT 159.0 07/20/2018   GLUCOSE 81 08/15/2018   CHOL 144 08/15/2018   TRIG 98.0 08/15/2018   HDL 24.70 (L) 08/15/2018   LDLCALC 100 (H) 08/15/2018   ALT 20 08/15/2018   AST 15 08/15/2018   NA 141 08/15/2018   K 4.3 08/15/2018   CL 109 08/15/2018   CREATININE 1.08 08/15/2018   BUN 16 08/15/2018   CO2 27 08/15/2018   TSH 0.95 05/06/2016   PSA 1.19 08/15/2018   INR 1.02 09/28/2011   HGBA1C 5.5 05/06/2016    Ct Renal Abd W/wo  Result Date: 09/06/2018 CLINICAL DATA:  Followup indeterminate right renal cystic lesion. EXAM: CT ABDOMEN WITHOUT AND WITH CONTRAST TECHNIQUE: Multidetector CT imaging of the abdomen was performed following the standard protocol before and following the bolus administration of intravenous contrast. CONTRAST:  189mL ISOVUE-300 IOPAMIDOL (ISOVUE-300) INJECTION 61% COMPARISON:  CT on 08/19/2016 FINDINGS: Lower chest: No acute findings. Hepatobiliary: Probable sub-cm hepatic cysts remain stable. No hepatic masses identified. Gallbladder is unremarkable. Pancreas:  No mass or inflammatory changes. Spleen:  Within normal limits in size and appearance. Adrenals/Urinary Tract: Normal adrenal glands. A 2 mm nonobstructing calculus is seen in the lower pole the left kidney. No evidence of hydronephrosis. Stable sub-cm cyst in lower pole of right kidney. A mildly complex cyst with a few thin internal septations is seen in the upper pole of the right kidney measuring 2.2 cm,  which is not significantly changed in size or appearance compared to prior study. Stomach/Bowel: Visualized portion unremarkable. Vascular/Lymphatic: No pathologically enlarged lymph nodes identified. No abdominal aortic aneurysm. IVC filter remains in appropriate position. Other:  None. Musculoskeletal:  No suspicious bone lesions identified. IMPRESSION: 1. Stable 2.2 cm Bosniak category 2 F cystic lesion in upper pole of right kidney. Recommend continued followup by abdomen CT without and with contrast in 12 months. This recommendation follows ACR consensus guidelines: Management of the Incidental Renal Mass on CT: A White Paper of the ACR Incidental Findings Committee. J Am Coll Radiol 2018;15:264-273. 2. Tiny nonobstructing left renal calculus. No evidence of hydronephrosis. Electronically Signed  By: Earle Gell M.D.   On: 09/06/2018 15:15    Assessment & Plan:   Nakhi was seen today for allergic rhinitis .  Diagnoses and all orders for this visit:  Seasonal allergic rhinitis due to pollen- Improvement noted.  I have asked him to use the oral antihistamine, antihistamine nasal spray, and steroid nasal spray as needed. -     levocetirizine (XYZAL) 5 MG tablet; Take 1 tablet (5 mg total) by mouth every evening.   I am having Mark [name] maintain his multivitamin with minerals, citalopram, topiramate, Azelastine-Fluticasone, propranolol, methylphenidate, and levocetirizine.  Meds ordered this encounter  Medications   levocetirizine (XYZAL) 5 MG tablet    Sig: Take 1 tablet (5 mg total) by mouth every evening.    Dispense:  90 tablet    Refill:  1     Follow-up: Return in about 6 months (around 08/17/2019).  Scarlette Calico, MD

## 2019-03-15 ENCOUNTER — Encounter: Payer: Medicare Other | Attending: Physical Medicine & Rehabilitation | Admitting: Physical Medicine & Rehabilitation

## 2019-03-15 ENCOUNTER — Encounter: Payer: Self-pay | Admitting: Physical Medicine & Rehabilitation

## 2019-03-15 ENCOUNTER — Other Ambulatory Visit: Payer: Self-pay

## 2019-03-15 DIAGNOSIS — F329 Major depressive disorder, single episode, unspecified: Secondary | ICD-10-CM | POA: Insufficient documentation

## 2019-03-15 DIAGNOSIS — G44309 Post-traumatic headache, unspecified, not intractable: Secondary | ICD-10-CM | POA: Diagnosis not present

## 2019-03-15 DIAGNOSIS — F908 Attention-deficit hyperactivity disorder, other type: Secondary | ICD-10-CM | POA: Insufficient documentation

## 2019-03-15 DIAGNOSIS — S069X5S Unspecified intracranial injury with loss of consciousness greater than 24 hours with return to pre-existing conscious level, sequela: Secondary | ICD-10-CM | POA: Insufficient documentation

## 2019-03-15 MED ORDER — METHYLPHENIDATE HCL 10 MG PO TABS: 15.0000 mg | ORAL_TABLET | Freq: Two times a day (BID) | ORAL | 0 refills | Status: DC
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
## 2019-03-15 NOTE — Patient Instructions (Signed)
PLEASE FEEL FREE TO CALL OUR OFFICE WITH ANY PROBLEMS OR QUESTIONS ([phone])      

## 2019-03-16 ENCOUNTER — Telehealth: Payer: Self-pay | Admitting: Physical Medicine & Rehabilitation

## 2019-03-16 NOTE — Telephone Encounter (Signed)
I have called Mark [name] back and let him know I updated the amount of ritalin to # 38 tablets. His last fill was 02/20/19 so he was not short any medication.

## 2019-03-16 NOTE — Telephone Encounter (Signed)
Patient is calling to let us know that he thinks the nurse counted his Ritalin medication wrong.  He stated that he counted 36 and the nurse counted 31. He would like for someone to call him back about this.

## 2019-03-23 ENCOUNTER — Other Ambulatory Visit: Payer: Self-pay | Admitting: Physical Medicine & Rehabilitation

## 2019-03-23 DIAGNOSIS — S069X5S Unspecified intracranial injury with loss of consciousness greater than 24 hours with return to pre-existing conscious level, sequela: Secondary | ICD-10-CM

## 2019-03-23 DIAGNOSIS — G44309 Post-traumatic headache, unspecified, not intractable: Secondary | ICD-10-CM

## 2019-04-05 ENCOUNTER — Other Ambulatory Visit: Payer: Self-pay | Admitting: Physical Medicine & Rehabilitation

## 2019-04-05 DIAGNOSIS — F329 Major depressive disorder, single episode, unspecified: Secondary | ICD-10-CM

## 2019-04-05 DIAGNOSIS — F32A Depression, unspecified: Secondary | ICD-10-CM

## 2019-04-05 NOTE — Telephone Encounter (Signed)
Recieved electronic medication refill request for Celexa, no mention on any notes to use or continue to use this medication.  Unsure if ok to refill based off that infomration.   Please advise.

## 2019-04-06 DIAGNOSIS — Z23 Encounter for immunization: Secondary | ICD-10-CM | POA: Diagnosis not present

## 2019-04-10 NOTE — Telephone Encounter (Signed)
Refilled celexa. thx

## 2019-05-30 ENCOUNTER — Telehealth: Payer: Self-pay

## 2019-05-30 DIAGNOSIS — G44309 Post-traumatic headache, unspecified, not intractable: Secondary | ICD-10-CM

## 2019-05-30 DIAGNOSIS — S069X5S Unspecified intracranial injury with loss of consciousness greater than 24 hours with return to pre-existing conscious level, sequela: Secondary | ICD-10-CM
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
## 2019-05-30 NOTE — Telephone Encounter (Signed)
Patient called for a refill of ritalin medication.

## 2019-05-31 MED ORDER — METHYLPHENIDATE HCL 10 MG PO TABS: 15.0000 mg | ORAL_TABLET | Freq: Two times a day (BID) | ORAL | 0 refills | Status: DC

## 2019-05-31 NOTE — Telephone Encounter (Signed)
Gave 2 mos refills

## 2019-05-31 NOTE — Addendum Note (Signed)
Addended by: Alger Simons T on: 05/31/2019 08:05 AM   Modules accepted: Orders

## 2019-07-12 ENCOUNTER — Encounter: Payer: Medicare Other | Admitting: Physical Medicine & Rehabilitation

## 2019-07-19 ENCOUNTER — Telehealth: Payer: Self-pay | Admitting: *Deleted

## 2019-07-19 ENCOUNTER — Ambulatory Visit: Payer: Medicare Other

## 2019-07-19 NOTE — Telephone Encounter (Signed)
IF HR is greater than 60 and BP is greater than 100/60 then he can increase the propranolol. I guess the bigger question is whether it's really helping/

## 2019-07-19 NOTE — Telephone Encounter (Signed)
Patient left a message asking Dr. Naaman Plummer if it is okay to increase his Propranolol to 40 mg TID. I reviewed the patients chart. I contacted the patient for clarification.  I informed the patient that initially he was prescribed propranolol 20 mg tablets with instructions to take 1/2 a tablet 3 times a day.  Last September there was a visit in which Dr. Naaman Plummer gave permission for 1 tablet 3 times a day. The patient states most recently, that on his own volition that he was taking 1.5 tablets 3 times a day.  While I was speaking to the patient, Dr. Naaman Plummer was at the nurses station hearing this conversation. I asked the patient why he needs the increase and he states that he still having headaches. Dr. Naaman Plummer advised that the patient needs to take his blood pressure and pulse and report back to [place].  No increase until we have updated readings.  I explained to the patient that propranolol reduces blood pressure and emphasized that taking more to stop headache pain could cause serious health problems.  Patient verbalized understanding.

## 2019-07-22 ENCOUNTER — Other Ambulatory Visit: Payer: Self-pay | Admitting: Physical Medicine & Rehabilitation

## 2019-07-22 DIAGNOSIS — G44309 Post-traumatic headache, unspecified, not intractable: Secondary | ICD-10-CM

## 2019-07-22 DIAGNOSIS — S069X5S Unspecified intracranial injury with loss of consciousness greater than 24 hours with return to pre-existing conscious level, sequela: Secondary | ICD-10-CM

## 2019-08-07 ENCOUNTER — Other Ambulatory Visit: Payer: Self-pay | Admitting: Internal Medicine

## 2019-08-07 DIAGNOSIS — J301 Allergic rhinitis due to pollen: Secondary | ICD-10-CM

## 2019-08-09 ENCOUNTER — Encounter: Payer: Self-pay | Admitting: Physical Medicine & Rehabilitation

## 2019-08-09 ENCOUNTER — Other Ambulatory Visit: Payer: Self-pay

## 2019-08-09 ENCOUNTER — Encounter: Payer: Medicare Other | Attending: Physical Medicine & Rehabilitation | Admitting: Physical Medicine & Rehabilitation

## 2019-08-09 VITALS — BP 152/88 | HR 64 | Temp 97.7°F | Ht 72.0 in | Wt 292.0 lb

## 2019-08-09 DIAGNOSIS — G894 Chronic pain syndrome: Secondary | ICD-10-CM | POA: Diagnosis not present

## 2019-08-09 DIAGNOSIS — G44309 Post-traumatic headache, unspecified, not intractable: Secondary | ICD-10-CM | POA: Insufficient documentation

## 2019-08-09 DIAGNOSIS — S069X5S Unspecified intracranial injury with loss of consciousness greater than 24 hours with return to pre-existing conscious level, sequela: Secondary | ICD-10-CM | POA: Insufficient documentation

## 2019-08-09 DIAGNOSIS — Z5181 Encounter for therapeutic drug level monitoring: Secondary | ICD-10-CM | POA: Insufficient documentation

## 2019-08-09 DIAGNOSIS — Z79891 Long term (current) use of opiate analgesic: Secondary | ICD-10-CM | POA: Diagnosis not present

## 2019-08-09 MED ORDER — METHYLPHENIDATE HCL 10 MG PO TABS: 15.0000 mg | ORAL_TABLET | Freq: Two times a day (BID) | ORAL | 0 refills | Status: DC

## 2019-08-09 NOTE — Patient Instructions (Signed)
TRY TAKING A PROBIOTIC FOR YOUR LOOSE STOOL. YOU CAN BUY CAPSULES OR EAT GREEK YOGURT.  LOOK FOR "SORBITOL" IN YOUR TOOTHPASTE AS IT MIGHT BE CAUSING LOOSE STOOL IF YOU'RE INGESTING ENOUGH OF IT. [place] OUT WELL.
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
## 2019-08-14 ENCOUNTER — Telehealth: Payer: Self-pay

## 2019-08-14 LAB — TOXASSURE SELECT,+ANTIDEPR,UR

## 2019-08-14 NOTE — Telephone Encounter (Signed)
UDS CONSISTENT WITH MEDICATION LISTING

## 2019-08-14 NOTE — Telephone Encounter (Signed)
Yes I saw. Not sure why you forwarded this to me??

## 2019-08-15 ENCOUNTER — Other Ambulatory Visit: Payer: Self-pay

## 2019-08-15 ENCOUNTER — Ambulatory Visit (INDEPENDENT_AMBULATORY_CARE_PROVIDER_SITE_OTHER): Payer: Medicare Other | Admitting: Internal Medicine

## 2019-08-15 ENCOUNTER — Encounter: Payer: Self-pay | Admitting: Internal Medicine

## 2019-08-15 VITALS — BP 138/92 | HR 60 | Temp 98.9°F | Resp 16 | Ht 72.0 in | Wt 294.0 lb

## 2019-08-15 DIAGNOSIS — I10 Essential (primary) hypertension: Secondary | ICD-10-CM | POA: Diagnosis not present

## 2019-08-15 LAB — URINALYSIS, ROUTINE W REFLEX MICROSCOPIC
Bilirubin Urine: NEGATIVE
Ketones, ur: NEGATIVE
Leukocytes,Ua: NEGATIVE
Nitrite: NEGATIVE
Specific Gravity, Urine: 1.025 (ref 1.000–1.030)
Total Protein, Urine: NEGATIVE
Urine Glucose: NEGATIVE
Urobilinogen, UA: 0.2 (ref 0.0–1.0)
WBC, UA: NONE SEEN (ref 0–?)
pH: 5.5 (ref 5.0–8.0)

## 2019-08-15 LAB — BASIC METABOLIC PANEL
BUN: 15 mg/dL (ref 6–23)
CO2: 30 mEq/L (ref 19–32)
Calcium: 9.6 mg/dL (ref 8.4–10.5)
Chloride: 101 mEq/L (ref 96–112)
Creatinine, Ser: 0.94 mg/dL (ref 0.40–1.50)
GFR: 101.75 mL/min (ref >60.00)
Glucose, Bld: 93 mg/dL (ref 70–99)
Potassium: 5.4 mEq/L — ABNORMAL HIGH (ref 3.5–5.1)
Sodium: 134 mEq/L — ABNORMAL LOW (ref 135–145)

## 2019-08-15 LAB — CBC WITH DIFFERENTIAL/PLATELET
Basophils Absolute: 0 10*3/uL (ref 0.0–0.1)
Basophils Relative: 0.8 % (ref 0.0–3.0)
Eosinophils Absolute: 0.1 10*3/uL (ref 0.0–0.7)
Eosinophils Relative: 2.9 % (ref 0.0–5.0)
HCT: 44 % (ref 39.0–52.0)
Hemoglobin: 14.3 g/dL (ref 13.0–17.0)
Lymphocytes Relative: 54.4 % — ABNORMAL HIGH (ref 12.0–46.0)
Lymphs Abs: 2.4 10*3/uL (ref 0.7–4.0)
MCHC: 32.4 g/dL (ref 30.0–36.0)
MCV: 86.7 fl (ref 78.0–100.0)
Monocytes Absolute: 0.3 10*3/uL (ref 0.1–1.0)
Monocytes Relative: 6.8 % (ref 3.0–12.0)
Neutro Abs: 1.6 10*3/uL (ref 1.4–7.7)
Neutrophils Relative %: 35.1 % — ABNORMAL LOW (ref 43.0–77.0)
Platelets: 176 10*3/uL (ref 150.0–400.0)
RBC: 5.07 Mil/uL (ref 4.22–5.81)
RDW: 14.1 % (ref 11.5–15.5)
WBC: 4.4 10*3/uL (ref 4.0–10.5)

## 2019-08-15 LAB — TSH: TSH: 1.64 u[IU]/mL (ref 0.35–4.50)

## 2019-08-15 MED ORDER — INDAPAMIDE 1.25 MG PO TABS: 1.2500 mg | ORAL_TABLET | Freq: Every day | ORAL | 0 refills | Status: DC

## 2019-08-15 NOTE — Patient Instructions (Signed)

## 2019-08-15 NOTE — Progress Notes (Signed)
Subjective:  Patient ID: Mark [name], male    DOB: 10-15-1965  Age: 54 y.o. MRN: [id number]  CC: Hypertension  This visit occurred during the SARS-CoV-2 public health emergency.  Safety protocols were in place, including screening questions prior to the visit, additional usage of staff PPE, and extensive cleaning of exam room while observing appropriate contact time as indicated for disinfecting solutions.    HPI Mark [name] presents for f/up - He is concerned that his blood pressure is not adequately well controlled.  He admits that he has been sedentary recently and has gained weight.  He has not been working on his lifestyle modifications.  When he is active he does not experience chest pain, shortness of breath, dyspnea on exertion, palpitations, edema, or fatigue.  Outpatient Medications Prior to Visit  Medication Sig Dispense Refill  . Azelastine-Fluticasone (DYMISTA) 137-50 MCG/ACT SUSP Place 1 Act into the nose 2 (two) times a day. 23 g 5  . citalopram (CELEXA) 40 MG tablet TAKE 1 TABLET(40 MG) BY MOUTH DAILY 30 tablet 6  . levocetirizine (XYZAL) 5 MG tablet TAKE 1 TABLET(5 MG) BY MOUTH EVERY EVENING 90 tablet 1  . methylphenidate (RITALIN) 10 MG tablet Take 1.5 tablets (15 mg total) by mouth 2 (two) times daily with breakfast and lunch. At 0700 and 1200 daily 90 tablet 0  . Multiple Vitamins-Minerals (MULTIVITAMIN WITH MINERALS) tablet Take 1 tablet by mouth daily.    . propranolol (INDERAL) 20 MG tablet Take 1 tablet (20 mg total) by mouth 3 (three) times daily. 90 tablet 3   No facility-administered medications prior to visit.    ROS Review of Systems  Constitutional: Positive for unexpected weight change. Negative for diaphoresis and fatigue.  HENT: Negative.   Eyes: Negative for visual disturbance.  Respiratory: Negative for apnea, cough, chest tightness, shortness of breath and wheezing.   Cardiovascular: Negative for chest pain, palpitations and leg swelling.   Gastrointestinal: Negative for abdominal pain, diarrhea, nausea and vomiting.  Endocrine: Negative.   Genitourinary: Negative.  Negative for difficulty urinating, dysuria and hematuria.  Musculoskeletal: Negative.   Skin: Negative.   Neurological: Negative for dizziness, weakness, light-headedness and headaches.  Hematological: Negative for adenopathy. Does not bruise/bleed easily.  Psychiatric/Behavioral: Negative.     Objective:  BP (!) 138/92   Pulse 60   Temp 98.9 F (37.2 C) (Oral)   Resp 16   Ht 6' (1.829 m)   Wt 294 lb (133.4 kg)   SpO2 98%   BMI 39.87 kg/m   BP Readings from Last 3 Encounters:  08/15/19 (!) 138/92  08/09/19 (!) 152/88  11/19/11 (!) 132/91    Wt Readings from Last 3 Encounters:  08/15/19 294 lb (133.4 kg)  08/09/19 292 lb (132.5 kg)  11/18/11 242 lb 1.6 oz (109.8 kg)    Physical Exam Vitals reviewed.  Constitutional:      Appearance: He is obese.  HENT:     Nose: Nose normal.     Mouth/Throat:     Mouth: Mucous membranes are moist.  Eyes:     General: No scleral icterus.    Conjunctiva/sclera: Conjunctivae normal.     Pupils: Pupils are equal, round, and reactive to light.  Cardiovascular:     Rate and Rhythm: Normal rate and regular rhythm.     Pulses: Normal pulses.     Heart sounds: No murmur.  Pulmonary:     Effort: Pulmonary effort is normal.     Breath sounds: No stridor. No wheezing,  rhonchi or rales.  Abdominal:     General: Abdomen is protuberant. Bowel sounds are normal. There is no distension.     Palpations: Abdomen is soft. There is no hepatomegaly, splenomegaly or mass.     Tenderness: There is no abdominal tenderness.     Hernia: No hernia is present.  Musculoskeletal:        General: Normal range of motion.     Cervical back: Neck supple. No tenderness.     Right lower leg: No edema.     Left lower leg: No edema.  Lymphadenopathy:     Cervical: No cervical adenopathy.  Skin:    General: Skin is warm and dry.   Neurological:     General: No focal deficit present.     Mental Status: He is alert.  Psychiatric:        Mood and Affect: Mood normal.        Behavior: Behavior normal.     Lab Results  Component Value Date   WBC 4.4 08/15/2019   HGB 14.3 08/15/2019   HCT 44.0 08/15/2019   PLT 176.0 08/15/2019   GLUCOSE 93 08/15/2019   CHOL 144 08/15/2018   TRIG 98.0 08/15/2018   HDL 24.70 (L) 08/15/2018   LDLCALC 100 (H) 08/15/2018   ALT 20 08/15/2018   AST 15 08/15/2018   NA 134 (L) 08/15/2019   K 5.4 (H) 08/15/2019   CL 101 08/15/2019   CREATININE 0.94 08/15/2019   BUN 15 08/15/2019   CO2 30 08/15/2019   TSH 1.64 08/15/2019   PSA 1.19 08/15/2018   INR 1.02 09/28/2011   HGBA1C 5.5 05/06/2016    CT RENAL ABD W/WO  Result Date: 09/06/2018 CLINICAL DATA:  Followup indeterminate right renal cystic lesion. EXAM: CT ABDOMEN WITHOUT AND WITH CONTRAST TECHNIQUE: Multidetector CT imaging of the abdomen was performed following the standard protocol before and following the bolus administration of intravenous contrast. CONTRAST:  117mL ISOVUE-300 IOPAMIDOL (ISOVUE-300) INJECTION 61% COMPARISON:  CT on 08/19/2016 FINDINGS: Lower chest: No acute findings. Hepatobiliary: Probable sub-cm hepatic cysts remain stable. No hepatic masses identified. Gallbladder is unremarkable. Pancreas:  No mass or inflammatory changes. Spleen:  Within normal limits in size and appearance. Adrenals/Urinary Tract: Normal adrenal glands. A 2 mm nonobstructing calculus is seen in the lower pole the left kidney. No evidence of hydronephrosis. Stable sub-cm cyst in lower pole of right kidney. A mildly complex cyst with a few thin internal septations is seen in the upper pole of the right kidney measuring 2.2 cm, which is not significantly changed in size or appearance compared to prior study. Stomach/Bowel: Visualized portion unremarkable. Vascular/Lymphatic: No pathologically enlarged lymph nodes identified. No abdominal aortic  aneurysm. IVC filter remains in appropriate position. Other:  None. Musculoskeletal:  No suspicious bone lesions identified. IMPRESSION: 1. Stable 2.2 cm Bosniak category 2 F cystic lesion in upper pole of right kidney. Recommend continued followup by abdomen CT without and with contrast in 12 months. This recommendation follows ACR consensus guidelines: Management of the Incidental Renal Mass on CT: A White Paper of the ACR Incidental Findings Committee. J Am Coll Radiol 2018;15:264-273. 2. Tiny nonobstructing left renal calculus. No evidence of hydronephrosis. Electronically Signed   By: Earle Gell M.D.   On: 09/06/2018 15:15    Assessment & Plan:   Bush was seen today for hypertension.  Diagnoses and all orders for this visit:  Essential hypertension- He has developed stage I hypertension with hyponatremia and hyperkalemia.  Will start  treating this with indapamide.  His other labs are negative for secondary causes or endorgan damage.  I will screen him for secondary causes of hypertension with an aldosterone/renin activity level and a cortisol level. -     CBC with Differential/Platelet -     Basic metabolic panel -     TSH -     Urinalysis, Routine w reflex microscopic -     indapamide (LOZOL) 1.25 MG tablet; Take 1 tablet (1.25 mg total) by mouth daily. -     Aldosterone + renin activity w/ ratio -     Cortisol; Future   I am having Mark [name] start on indapamide. I am also having him maintain his multivitamin with minerals, Azelastine-Fluticasone, citalopram, propranolol, levocetirizine, and methylphenidate.  Meds ordered this encounter  Medications  . indapamide (LOZOL) 1.25 MG tablet    Sig: Take 1 tablet (1.25 mg total) by mouth daily.    Dispense:  90 tablet    Refill:  0     Follow-up: Return in about 3 months (around 11/12/2019).  Scarlette Calico, MD

## 2019-08-17 ENCOUNTER — Ambulatory Visit: Payer: Medicare Other | Admitting: Internal Medicine

## 2019-08-18 ENCOUNTER — Encounter: Payer: Self-pay | Admitting: Internal Medicine

## 2019-08-21 ENCOUNTER — Other Ambulatory Visit (INDEPENDENT_AMBULATORY_CARE_PROVIDER_SITE_OTHER): Payer: Medicare Other

## 2019-08-21 ENCOUNTER — Other Ambulatory Visit: Payer: Medicare Other | Admitting: Internal Medicine

## 2019-08-21 ENCOUNTER — Other Ambulatory Visit: Payer: Self-pay

## 2019-08-21 DIAGNOSIS — I10 Essential (primary) hypertension: Secondary | ICD-10-CM

## 2019-08-21 LAB — CORTISOL: Cortisol, Plasma: 5.2 ug/dL

## 2019-08-28 NOTE — Addendum Note (Signed)
Addended by: Karle Barr on: 08/28/2019 02:48 PM   Modules accepted: Orders

## 2019-08-30 ENCOUNTER — Other Ambulatory Visit: Payer: Medicare Other

## 2019-08-30 DIAGNOSIS — I10 Essential (primary) hypertension: Secondary | ICD-10-CM

## 2019-09-06 LAB — ALDOSTERONE + RENIN ACTIVITY W/ RATIO
ALDO / PRA Ratio: 5.6 Ratio (ref 0.9–28.9)
Aldosterone: 5 ng/dL
Renin Activity: 0.89 ng/mL/h (ref 0.25–5.82)

## 2019-09-11 ENCOUNTER — Telehealth: Payer: Self-pay

## 2019-09-11 NOTE — Telephone Encounter (Signed)
UDS RESULTS CONSISTENT WITH MEDICATIONS ON FILE  

## 2019-10-04 ENCOUNTER — Encounter: Payer: Medicare HMO | Attending: Physical Medicine & Rehabilitation | Admitting: Physical Medicine & Rehabilitation

## 2019-10-04 ENCOUNTER — Other Ambulatory Visit: Payer: Self-pay

## 2019-10-04 ENCOUNTER — Encounter: Payer: Self-pay | Admitting: Physical Medicine & Rehabilitation

## 2019-10-04 DIAGNOSIS — G894 Chronic pain syndrome: Secondary | ICD-10-CM | POA: Diagnosis not present

## 2019-10-04 DIAGNOSIS — G44309 Post-traumatic headache, unspecified, not intractable: Secondary | ICD-10-CM | POA: Insufficient documentation

## 2019-10-04 DIAGNOSIS — Z5181 Encounter for therapeutic drug level monitoring: Secondary | ICD-10-CM | POA: Diagnosis not present

## 2019-10-04 DIAGNOSIS — Z79891 Long term (current) use of opiate analgesic: Secondary | ICD-10-CM | POA: Insufficient documentation

## 2019-10-04 DIAGNOSIS — S069X5S Unspecified intracranial injury with loss of consciousness greater than 24 hours with return to pre-existing conscious level, sequela: Secondary | ICD-10-CM | POA: Diagnosis not present

## 2019-10-04 MED ORDER — METHYLPHENIDATE HCL 10 MG PO TABS: 15.0000 mg | ORAL_TABLET | Freq: Two times a day (BID) | ORAL | 0 refills | Status: DC

## 2019-10-04 NOTE — Patient Instructions (Addendum)
PLEASE FEEL FREE TO CALL OUR OFFICE WITH ANY PROBLEMS OR QUESTIONS ([phone])      
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
## 2019-10-16 ENCOUNTER — Other Ambulatory Visit: Payer: Self-pay | Admitting: *Deleted

## 2019-10-16 ENCOUNTER — Other Ambulatory Visit: Payer: Self-pay | Admitting: Internal Medicine

## 2019-10-16 DIAGNOSIS — I10 Essential (primary) hypertension: Secondary | ICD-10-CM

## 2019-10-16 DIAGNOSIS — F329 Major depressive disorder, single episode, unspecified: Secondary | ICD-10-CM

## 2019-10-16 DIAGNOSIS — J301 Allergic rhinitis due to pollen: Secondary | ICD-10-CM

## 2019-10-16 DIAGNOSIS — G44309 Post-traumatic headache, unspecified, not intractable: Secondary | ICD-10-CM

## 2019-10-16 DIAGNOSIS — F32A Depression, unspecified: Secondary | ICD-10-CM

## 2019-10-16 DIAGNOSIS — S069X5S Unspecified intracranial injury with loss of consciousness greater than 24 hours with return to pre-existing conscious level, sequela: Secondary | ICD-10-CM

## 2019-10-16 MED ORDER — PROPRANOLOL HCL 20 MG PO TABS: 20.0000 mg | ORAL_TABLET | Freq: Three times a day (TID) | ORAL | 1 refills | Status: DC

## 2019-10-16 MED ORDER — INDAPAMIDE 1.25 MG PO TABS: 1.2500 mg | ORAL_TABLET | Freq: Every day | ORAL | 0 refills | Status: DC

## 2019-10-16 MED ORDER — CITALOPRAM HYDROBROMIDE 40 MG PO TABS: ORAL_TABLET | ORAL | 1 refills | Status: DC

## 2019-10-16 MED ORDER — LEVOCETIRIZINE DIHYDROCHLORIDE 5 MG PO TABS: 5.0000 mg | ORAL_TABLET | Freq: Every evening | ORAL | 1 refills | Status: DC

## 2019-10-16 NOTE — Telephone Encounter (Addendum)
Mark [name] requested Propanolol and Citalopram be managed by [facility] order. Next appt is 02/07/20. (90 day supply sent with 1 refill)

## 2019-10-16 NOTE — Addendum Note (Signed)
Addended by: Caro Hight on: 10/16/2019 02:47 PM   Modules accepted: Orders

## 2019-11-13 ENCOUNTER — Ambulatory Visit (INDEPENDENT_AMBULATORY_CARE_PROVIDER_SITE_OTHER): Payer: Medicare HMO | Admitting: Internal Medicine

## 2019-11-13 ENCOUNTER — Encounter: Payer: Self-pay | Admitting: Internal Medicine

## 2019-11-13 ENCOUNTER — Other Ambulatory Visit: Payer: Self-pay

## 2019-11-13 ENCOUNTER — Ambulatory Visit: Payer: Medicare Other | Admitting: Internal Medicine

## 2019-11-13 VITALS — BP 132/82 | HR 68 | Temp 98.2°F | Resp 16 | Ht 72.0 in | Wt 295.0 lb

## 2019-11-13 DIAGNOSIS — I10 Essential (primary) hypertension: Secondary | ICD-10-CM | POA: Diagnosis not present

## 2019-11-13 DIAGNOSIS — Z6841 Body Mass Index (BMI) 40.0 and over, adult: Secondary | ICD-10-CM | POA: Diagnosis not present

## 2019-11-13 LAB — BASIC METABOLIC PANEL
BUN: 15 mg/dL (ref 6–23)
CO2: 32 mEq/L (ref 19–32)
Calcium: 9.4 mg/dL (ref 8.4–10.5)
Chloride: 101 mEq/L (ref 96–112)
Creatinine, Ser: 1.01 mg/dL (ref 0.40–1.50)
GFR: 93.57 mL/min (ref >60.00)
Glucose, Bld: 102 mg/dL — ABNORMAL HIGH (ref 70–99)
Potassium: 4.4 mEq/L (ref 3.5–5.1)
Sodium: 136 mEq/L (ref 135–145)

## 2019-11-13 NOTE — Patient Instructions (Signed)

## 2019-11-13 NOTE — Progress Notes (Signed)
Subjective:  Patient ID: Mark [name], male    DOB: 11-Feb-1966  Age: 54 y.o. MRN: [id number]  CC: Hypertension  This visit occurred during the SARS-CoV-2 public health emergency.  Safety protocols were in place, including screening questions prior to the visit, additional usage of staff PPE, and extensive cleaning of exam room while observing appropriate contact time as indicated for disinfecting solutions.   HPI Staton Byfield presents for f/up - He tells me his blood pressure has been well controlled.  His mother died abruptly about 2 months ago.  It sounds like it was a heart attack.  He has not been working on his lifestyle modifications and is gained weight.  The work-up for secondary causes of hypertension was negative.  He denies any recent episodes of headache, blurred vision, chest pain, shortness of breath.  Outpatient Medications Prior to Visit  Medication Sig Dispense Refill  . Azelastine-Fluticasone (DYMISTA) 137-50 MCG/ACT SUSP Place 1 Act into the nose 2 (two) times a day. 23 g 5  . citalopram (CELEXA) 40 MG tablet TAKE 1 TABLET(40 MG) BY MOUTH DAILY 90 tablet 1  . indapamide (LOZOL) 1.25 MG tablet Take 1 tablet (1.25 mg total) by mouth daily. 90 tablet 0  . levocetirizine (XYZAL) 5 MG tablet Take 1 tablet (5 mg total) by mouth every evening. 90 tablet 1  . methylphenidate (RITALIN) 10 MG tablet Take 1.5 tablets (15 mg total) by mouth 2 (two) times daily with breakfast and lunch. At 0700 and 1200 daily 90 tablet 0  . Multiple Vitamins-Minerals (MULTIVITAMIN WITH MINERALS) tablet Take 1 tablet by mouth daily.    . propranolol (INDERAL) 20 MG tablet Take 1 tablet (20 mg total) by mouth 3 (three) times daily. 270 tablet 1   No facility-administered medications prior to visit.    ROS Review of Systems  Constitutional: Positive for unexpected weight change (wt gain). Negative for appetite change, diaphoresis and fatigue.  HENT: Negative.   Eyes: Negative.   Respiratory: Negative  for cough, chest tightness, shortness of breath and wheezing.   Cardiovascular: Negative for chest pain, palpitations and leg swelling.  Gastrointestinal: Negative for abdominal pain, constipation, nausea and vomiting.  Endocrine: Negative.   Genitourinary: Negative.  Negative for difficulty urinating, dysuria and hematuria.  Musculoskeletal: Negative for arthralgias and myalgias.  Skin: Negative.  Negative for color change, pallor and rash.  Neurological: Negative.  Negative for dizziness, weakness, light-headedness and headaches.  Hematological: Negative for adenopathy. Does not bruise/bleed easily.  Psychiatric/Behavioral: Negative.     Objective:  BP 132/82 (BP Location: Left Arm, Patient Position: Sitting, Cuff Size: Large)   Pulse 68   Temp 98.2 F (36.8 C) (Oral)   Resp 16   Ht 6' (1.829 m)   Wt 295 lb (133.8 kg)   SpO2 98%   BMI 40.01 kg/m   BP Readings from Last 3 Encounters:  11/13/19 132/82  10/04/19 120/87  08/15/19 (!) 138/92    Wt Readings from Last 3 Encounters:  11/13/19 295 lb (133.8 kg)  10/04/19 286 lb (129.7 kg)  08/15/19 294 lb (133.4 kg)    Physical Exam Vitals reviewed.  Constitutional:      Appearance: He is obese.  HENT:     Mouth/Throat:     Mouth: Mucous membranes are moist.  Eyes:     General: No scleral icterus.    Conjunctiva/sclera: Conjunctivae normal.  Cardiovascular:     Rate and Rhythm: Normal rate and regular rhythm.     Heart sounds: No  murmur.  Pulmonary:     Effort: Pulmonary effort is normal.     Breath sounds: No wheezing, rhonchi or rales.  Abdominal:     General: Abdomen is protuberant. Bowel sounds are normal. There is no distension.     Palpations: Abdomen is soft. There is no hepatomegaly, splenomegaly or mass.     Tenderness: There is no abdominal tenderness.  Musculoskeletal:        General: Normal range of motion.     Cervical back: Neck supple.     Right lower leg: No edema.     Left lower leg: No edema.   Lymphadenopathy:     Cervical: No cervical adenopathy.  Skin:    General: Skin is warm and dry.     Coloration: Skin is not pale.  Neurological:     General: No focal deficit present.     Mental Status: He is alert.  Psychiatric:        Mood and Affect: Mood normal.        Behavior: Behavior normal.     Lab Results  Component Value Date   WBC 4.4 08/15/2019   HGB 14.3 08/15/2019   HCT 44.0 08/15/2019   PLT 176.0 08/15/2019   GLUCOSE 102 (H) 11/13/2019   CHOL 144 08/15/2018   TRIG 98.0 08/15/2018   HDL 24.70 (L) 08/15/2018   LDLCALC 100 (H) 08/15/2018   ALT 20 08/15/2018   AST 15 08/15/2018   NA 136 11/13/2019   K 4.4 11/13/2019   CL 101 11/13/2019   CREATININE 1.01 11/13/2019   BUN 15 11/13/2019   CO2 32 11/13/2019   TSH 1.64 08/15/2019   PSA 1.19 08/15/2018   INR 1.02 09/28/2011   HGBA1C 5.5 05/06/2016    CT RENAL ABD W/WO  Result Date: 09/06/2018 CLINICAL DATA:  Followup indeterminate right renal cystic lesion. EXAM: CT ABDOMEN WITHOUT AND WITH CONTRAST TECHNIQUE: Multidetector CT imaging of the abdomen was performed following the standard protocol before and following the bolus administration of intravenous contrast. CONTRAST:  181mL ISOVUE-300 IOPAMIDOL (ISOVUE-300) INJECTION 61% COMPARISON:  CT on 08/19/2016 FINDINGS: Lower chest: No acute findings. Hepatobiliary: Probable sub-cm hepatic cysts remain stable. No hepatic masses identified. Gallbladder is unremarkable. Pancreas:  No mass or inflammatory changes. Spleen:  Within normal limits in size and appearance. Adrenals/Urinary Tract: Normal adrenal glands. A 2 mm nonobstructing calculus is seen in the lower pole the left kidney. No evidence of hydronephrosis. Stable sub-cm cyst in lower pole of right kidney. A mildly complex cyst with a few thin internal septations is seen in the upper pole of the right kidney measuring 2.2 cm, which is not significantly changed in size or appearance compared to prior study.  Stomach/Bowel: Visualized portion unremarkable. Vascular/Lymphatic: No pathologically enlarged lymph nodes identified. No abdominal aortic aneurysm. IVC filter remains in appropriate position. Other:  None. Musculoskeletal:  No suspicious bone lesions identified. IMPRESSION: 1. Stable 2.2 cm Bosniak category 2 F cystic lesion in upper pole of right kidney. Recommend continued followup by abdomen CT without and with contrast in 12 months. This recommendation follows ACR consensus guidelines: Management of the Incidental Renal Mass on CT: A White Paper of the ACR Incidental Findings Committee. J Am Coll Radiol 2018;15:264-273. 2. Tiny nonobstructing left renal calculus. No evidence of hydronephrosis. Electronically Signed   By: Earle Gell M.D.   On: 09/06/2018 15:15    Assessment & Plan:   Flora was seen today for hypertension.  Diagnoses and all orders for this  visit:  Essential hypertension- His blood pressure is well controlled.  Electrolytes and renal function are normal.  I asked him to improve on his lifestyle modifications. Will cont the current dose of indapamide. -     Basic metabolic panel; Future -     Basic metabolic panel   I am having Mark [name] maintain his multivitamin with minerals, Azelastine-Fluticasone, methylphenidate, indapamide, levocetirizine, propranolol, and citalopram.  No orders of the defined types were placed in this encounter.    Follow-up: Return in about 6 months (around 05/15/2020).  Scarlette Calico, MD

## 2019-11-14 ENCOUNTER — Encounter: Payer: Self-pay | Admitting: Internal Medicine

## 2019-12-15 ENCOUNTER — Other Ambulatory Visit: Payer: Self-pay

## 2019-12-15 DIAGNOSIS — S069X5S Unspecified intracranial injury with loss of consciousness greater than 24 hours with return to pre-existing conscious level, sequela: Secondary | ICD-10-CM

## 2019-12-15 DIAGNOSIS — G44309 Post-traumatic headache, unspecified, not intractable: Secondary | ICD-10-CM

## 2019-12-15 MED ORDER — METHYLPHENIDATE HCL 10 MG PO TABS: 15.0000 mg | ORAL_TABLET | Freq: Two times a day (BID) | ORAL | 0 refills | Status: DC

## 2019-12-15 NOTE — Telephone Encounter (Signed)
Dr. Naaman Plummer is out of the office. Patients request refill Ritalin. Please advise. Thank you.

## 2019-12-15 NOTE — Telephone Encounter (Signed)
PMP was Reviewed. Dr Naaman Plummer Note was Reviewed. Ritalin e-scribed today. Placed a call to Mr. Gucciardo regarding the above, he verbalizes understanding.

## 2019-12-19 ENCOUNTER — Telehealth: Payer: Self-pay | Admitting: Internal Medicine

## 2019-12-19 DIAGNOSIS — I10 Essential (primary) hypertension: Secondary | ICD-10-CM

## 2019-12-19 MED ORDER — INDAPAMIDE 1.25 MG PO TABS: 1.2500 mg | ORAL_TABLET | Freq: Every day | ORAL | 0 refills | Status: DC

## 2019-12-19 NOTE — Telephone Encounter (Signed)
New Message:   1.Medication Requested: indapamide (LOZOL) 1.25 MG tablet 2. Pharmacy (Name, Street, [place]): [facility] [phone] - [place], [address] 3. On Med List: yes  4. Last Visit with PCP: 11/13/19  5. Next visit date with PCP:  None   Agent: Please be advised that RX refills may take up to 3 business days. We ask that you follow-up with your pharmacy.

## 2019-12-19 NOTE — Telephone Encounter (Signed)
erx has been sent as requested.  

## 2019-12-19 NOTE — Telephone Encounter (Signed)
New message:   Pt is calling and states he needs the refill to go to [facility]. Please fax [phone]. [facility] mail order pharmacy.

## 2020-01-02 DIAGNOSIS — L2089 Other atopic dermatitis: Secondary | ICD-10-CM | POA: Diagnosis not present

## 2020-01-02 DIAGNOSIS — L918 Other hypertrophic disorders of the skin: Secondary | ICD-10-CM | POA: Diagnosis not present

## 2020-02-06 ENCOUNTER — Other Ambulatory Visit: Payer: Self-pay

## 2020-02-06 DIAGNOSIS — S069X5S Unspecified intracranial injury with loss of consciousness greater than 24 hours with return to pre-existing conscious level, sequela: Secondary | ICD-10-CM

## 2020-02-06 DIAGNOSIS — G44309 Post-traumatic headache, unspecified, not intractable: Secondary | ICD-10-CM

## 2020-02-06 DIAGNOSIS — J301 Allergic rhinitis due to pollen: Secondary | ICD-10-CM

## 2020-02-06 DIAGNOSIS — I10 Essential (primary) hypertension: Secondary | ICD-10-CM

## 2020-02-06 DIAGNOSIS — F32A Depression, unspecified: Secondary | ICD-10-CM

## 2020-02-06 MED ORDER — CITALOPRAM HYDROBROMIDE 40 MG PO TABS: ORAL_TABLET | ORAL | 1 refills | Status: DC

## 2020-02-06 MED ORDER — PROPRANOLOL HCL 20 MG PO TABS: 20.0000 mg | ORAL_TABLET | Freq: Three times a day (TID) | ORAL | 1 refills | Status: DC

## 2020-02-06 MED ORDER — INDAPAMIDE 1.25 MG PO TABS: 1.2500 mg | ORAL_TABLET | Freq: Every day | ORAL | 0 refills | Status: DC

## 2020-02-06 MED ORDER — LEVOCETIRIZINE DIHYDROCHLORIDE 5 MG PO TABS: 5.0000 mg | ORAL_TABLET | Freq: Every evening | ORAL | 1 refills | Status: DC

## 2020-02-06 MED ORDER — AZELASTINE-FLUTICASONE 137-50 MCG/ACT NA SUSP: 1.0000 | Freq: Every day | NASAL | 5 refills | Status: DC

## 2020-02-06 NOTE — Telephone Encounter (Signed)
Pt is also requesting refill for the triamcinolone acetonide topical cream. No record you have ever prescribed the cream.

## 2020-02-07 ENCOUNTER — Telehealth: Payer: Self-pay | Admitting: *Deleted

## 2020-02-07 ENCOUNTER — Encounter: Payer: Medicare HMO | Attending: Physical Medicine & Rehabilitation | Admitting: Physical Medicine & Rehabilitation

## 2020-02-07 ENCOUNTER — Other Ambulatory Visit: Payer: Self-pay

## 2020-02-07 ENCOUNTER — Encounter: Payer: Self-pay | Admitting: Physical Medicine & Rehabilitation

## 2020-02-07 VITALS — BP 133/86 | HR 68 | Temp 98.4°F | Ht 72.0 in | Wt 291.0 lb

## 2020-02-07 DIAGNOSIS — G8110 Spastic hemiplegia affecting unspecified side: Secondary | ICD-10-CM | POA: Insufficient documentation

## 2020-02-07 DIAGNOSIS — G44309 Post-traumatic headache, unspecified, not intractable: Secondary | ICD-10-CM | POA: Diagnosis not present

## 2020-02-07 DIAGNOSIS — G44321 Chronic post-traumatic headache, intractable: Secondary | ICD-10-CM | POA: Insufficient documentation

## 2020-02-07 DIAGNOSIS — S069X5S Unspecified intracranial injury with loss of consciousness greater than 24 hours with return to pre-existing conscious level, sequela: Secondary | ICD-10-CM | POA: Diagnosis not present

## 2020-02-07 MED ORDER — METHYLPHENIDATE HCL 10 MG PO TABS: 15.0000 mg | ORAL_TABLET | Freq: Two times a day (BID) | ORAL | 0 refills | Status: DC

## 2020-02-07 NOTE — Telephone Encounter (Signed)
Done

## 2020-02-07 NOTE — Telephone Encounter (Signed)
Mark [name]'s mother called and they would like to get his Ritalin sent to [place]. I have cancelled the Rx for the local pharmacy.

## 2020-02-07 NOTE — Patient Instructions (Signed)
PLEASE FEEL FREE TO CALL OUR OFFICE WITH ANY PROBLEMS OR QUESTIONS ([phone])      
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
## 2020-02-13 ENCOUNTER — Other Ambulatory Visit: Payer: Self-pay | Admitting: Internal Medicine

## 2020-02-14 ENCOUNTER — Other Ambulatory Visit: Payer: Self-pay | Admitting: Internal Medicine

## 2020-02-14 DIAGNOSIS — J301 Allergic rhinitis due to pollen: Secondary | ICD-10-CM

## 2020-02-14 MED ORDER — MOMETASONE FUROATE 50 MCG/ACT NA SUSP: 4.0000 | Freq: Every day | NASAL | 1 refills | Status: DC

## 2020-02-14 MED ORDER — AZELASTINE HCL 0.1 % NA SOLN: 2.0000 | Freq: Two times a day (BID) | NASAL | 1 refills | Status: DC

## 2020-02-15 ENCOUNTER — Telehealth: Payer: Self-pay | Admitting: Internal Medicine

## 2020-02-15 NOTE — Telephone Encounter (Signed)
° °  [employer] calling to request order for Azelastine  fluticasone nasal spray [employer] [phone]

## 2020-02-15 NOTE — Telephone Encounter (Signed)
erx was sent on 02/14/2020.

## 2020-02-20 NOTE — Telephone Encounter (Signed)
Key: [id number]

## 2020-02-22 NOTE — Telephone Encounter (Signed)
LVM for pt informing that PA has been approved and to contact the pharmacy to have the prescription filled.

## 2020-03-15 ENCOUNTER — Ambulatory Visit (INDEPENDENT_AMBULATORY_CARE_PROVIDER_SITE_OTHER): Payer: Medicare HMO

## 2020-03-15 ENCOUNTER — Other Ambulatory Visit: Payer: Self-pay

## 2020-03-15 VITALS — BP 110/60 | HR 67 | Temp 98.4°F | Resp 16 | Ht 72.0 in | Wt 293.0 lb

## 2020-03-15 DIAGNOSIS — Z0000 Encounter for general adult medical examination without abnormal findings: Secondary | ICD-10-CM

## 2020-03-15 NOTE — Patient Instructions (Signed)
[name] , Thank you for taking time to come for your Medicare Wellness Visit. I appreciate your ongoing commitment to your health goals. Please review the following plan we discussed and let me know if I can assist you in the future.   Screening recommendations/referrals: Colonoscopy: 12/05/2017; due every 3 years Recommended yearly ophthalmology/optometry visit for glaucoma screening and checkup Recommended yearly dental visit for hygiene and checkup  Vaccinations: Influenza vaccine: 04/06/2019 Pneumococcal vaccine: up to date Tdap vaccine: 12/21/2012; due every 10 years (2024) Shingles vaccine: never done Covid-19: up to date [employer])  Advanced directives: Advance directive discussed with you today. Even though you declined this today please call our office should you change your mind and we can give you the proper paperwork for you to fill out.  Conditions/risks identified: Yes.  Next appointment: Please schedule your next Medicare Wellness Visit with your Nurse Health Advisor in 1 year.  Preventive Care 40-64 Years, Male Preventive care refers to lifestyle choices and visits with your health care provider that can promote health and wellness. What does preventive care include?  A yearly physical exam. This is also called an annual well check.  Dental exams once or twice a year.  Routine eye exams. Ask your health care provider how often you should have your eyes checked.  Personal lifestyle choices, including:  Daily care of your teeth and gums.  Regular physical activity.  Eating a healthy diet.  Avoiding tobacco and drug use.  Limiting alcohol use.  Practicing safe sex.  Taking low-dose aspirin every day starting at age 61. What happens during an annual well check? The services and screenings done by your health care provider during your annual well check will depend on your age, overall health, lifestyle risk factors, and family history of disease. Counseling   Your health care provider may ask you questions about your:  Alcohol use.  Tobacco use.  Drug use.  Emotional well-being.  Home and relationship well-being.  Sexual activity.  Eating habits.  Work and work Statistician. Screening  You may have the following tests or measurements:  Height, weight, and BMI.  Blood pressure.  Lipid and cholesterol levels. These may be checked every 5 years, or more frequently if you are over 19 years old.  Skin check.  Lung cancer screening. You may have this screening every year starting at age 78 if you have a 30-pack-year history of smoking and currently smoke or have quit within the past 15 years.  Fecal occult blood test (FOBT) of the stool. You may have this test every year starting at age 26.  Flexible sigmoidoscopy or colonoscopy. You may have a sigmoidoscopy every 5 years or a colonoscopy every 10 years starting at age 70.  Prostate cancer screening. Recommendations will vary depending on your family history and other risks.  Hepatitis C blood test.  Hepatitis B blood test.  Sexually transmitted disease (STD) testing.  Diabetes screening. This is done by checking your blood sugar (glucose) after you have not eaten for a while (fasting). You may have this done every 1-3 years. Discuss your test results, treatment options, and if necessary, the need for more tests with your health care provider. Vaccines  Your health care provider may recommend certain vaccines, such as:  Influenza vaccine. This is recommended every year.  Tetanus, diphtheria, and acellular pertussis (Tdap, Td) vaccine. You may need a Td booster every 10 years.  Zoster vaccine. You may need this after age 5.  Pneumococcal 13-valent conjugate (PCV13)  vaccine. You may need this if you have certain conditions and have not been vaccinated.  Pneumococcal polysaccharide (PPSV23) vaccine. You may need one or two doses if you smoke cigarettes or if you have  certain conditions. Talk to your health care provider about which screenings and vaccines you need and how often you need them. This information is not intended to replace advice given to you by your health care provider. Make sure you discuss any questions you have with your health care provider. Document Released: 07/12/2015 Document Revised: 03/04/2016 Document Reviewed: 04/16/2015 Elsevier Interactive Patient Education  2017 [place] Prevention in the Home Falls can cause injuries. They can happen to people of all ages. There are many things you can do to make your home safe and to help prevent falls. What can I do on the outside of my home?  Regularly fix the edges of walkways and driveways and fix any cracks.  Remove anything that might make you trip as you walk through a door, such as a raised step or threshold.  Trim any bushes or trees on the path to your home.  Use bright outdoor lighting.  Clear any walking paths of anything that might make someone trip, such as rocks or tools.  Regularly check to see if handrails are loose or broken. Make sure that both sides of any steps have handrails.  Any raised decks and porches should have guardrails on the edges.  Have any leaves, snow, or ice cleared regularly.  Use sand or salt on walking paths during winter.  Clean up any spills in your garage right away. This includes oil or grease spills. What can I do in the bathroom?  Use night lights.  Install grab bars by the toilet and in the tub and shower. Do not use towel bars as grab bars.  Use non-skid mats or decals in the tub or shower.  If you need to sit down in the shower, use a plastic, non-slip stool.  Keep the floor dry. Clean up any water that spills on the floor as soon as it happens.  Remove soap buildup in the tub or shower regularly.  Attach bath mats securely with double-sided non-slip rug tape.  Do not have throw rugs and other things on the  floor that can make you trip. What can I do in the bedroom?  Use night lights.  Make sure that you have a light by your bed that is easy to reach.  Do not use any sheets or blankets that are too big for your bed. They should not hang down onto the floor.  Have a firm chair that has side arms. You can use this for support while you get dressed.  Do not have throw rugs and other things on the floor that can make you trip. What can I do in the kitchen?  Clean up any spills right away.  Avoid walking on wet floors.  Keep items that you use a lot in easy-to-reach places.  If you need to reach something above you, use a strong step stool that has a grab bar.  Keep electrical cords out of the way.  Do not use floor polish or wax that makes floors slippery. If you must use wax, use non-skid floor wax.  Do not have throw rugs and other things on the floor that can make you trip. What can I do with my stairs?  Do not leave any items on the stairs.  Make sure  that there are handrails on both sides of the stairs and use them. Fix handrails that are broken or loose. Make sure that handrails are as long as the stairways.  Check any carpeting to make sure that it is firmly attached to the stairs. Fix any carpet that is loose or worn.  Avoid having throw rugs at the top or bottom of the stairs. If you do have throw rugs, attach them to the floor with carpet tape.  Make sure that you have a light switch at the top of the stairs and the bottom of the stairs. If you do not have them, ask someone to add them for you. What else can I do to help prevent falls?  Wear shoes that:  Do not have high heels.  Have rubber bottoms.  Are comfortable and fit you well.  Are closed at the toe. Do not wear sandals.  If you use a stepladder:  Make sure that it is fully opened. Do not climb a closed stepladder.  Make sure that both sides of the stepladder are locked into place.  Ask someone to  hold it for you, if possible.  Clearly mark and make sure that you can see:  Any grab bars or handrails.  First and last steps.  Where the edge of each step is.  Use tools that help you move around (mobility aids) if they are needed. These include:  Canes.  Walkers.  Scooters.  Crutches.  Turn on the lights when you go into a dark area. Replace any light bulbs as soon as they burn out.  Set up your furniture so you have a clear path. Avoid moving your furniture around.  If any of your floors are uneven, fix them.  If there are any pets around you, be aware of where they are.  Review your medicines with your doctor. Some medicines can make you feel dizzy. This can increase your chance of falling. Ask your doctor what other things that you can do to help prevent falls. This information is not intended to replace advice given to you by your health care provider. Make sure you discuss any questions you have with your health care provider. Document Released: 04/11/2009 Document Revised: 11/21/2015 Document Reviewed: 07/20/2014 Elsevier Interactive Patient Education  2017 [employer].
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
## 2020-03-26 ENCOUNTER — Telehealth: Payer: Self-pay | Admitting: Internal Medicine

## 2020-03-26 NOTE — Progress Notes (Signed)
  Chronic Care Management   Outreach Note  03/26/2020 Name: Mark [name] MRN: [id number] DOB: April 30, 1966  Referred by: Janith Lima, MD Reason for referral : No chief complaint on file.   An unsuccessful telephone outreach was attempted today. The patient was referred to the pharmacist for assistance with care management and care coordination.   Follow Up Plan:   Carley Perdue [employer] Scheduler

## 2020-03-27 ENCOUNTER — Telehealth: Payer: Self-pay | Admitting: Internal Medicine

## 2020-03-27 DIAGNOSIS — I10 Essential (primary) hypertension: Secondary | ICD-10-CM

## 2020-03-27 NOTE — Progress Notes (Signed)
  Chronic Care Management   Note  03/27/2020 Name: Mark [name] MRN: [id number] DOB: May 18, 1966  Mark [name] is a 53 y.o. year old male who is a primary care patient of Janith Lima, MD. I reached out to Lamont Snowball by phone today in response to a referral sent by Mr. Alfonse Spruce PCP, Janith Lima, MD.   [name] was given information about Chronic Care Management services today including:  1. CCM service includes personalized support from designated clinical staff supervised by his physician, including individualized plan of care and coordination with other care providers 2. 24/7 contact phone numbers for assistance for urgent and routine care needs. 3. Service will only be billed when office clinical staff spend 20 minutes or more in a month to coordinate care. 4. Only one practitioner may furnish and bill the service in a calendar month. 5. The patient may stop CCM services at any time (effective at the end of the month) by phone call to the office staff.   Patient agreed to services and verbal consent obtained.   Follow up plan:   Carley Perdue [employer] Scheduler

## 2020-04-03 DIAGNOSIS — L209 Atopic dermatitis, unspecified: Secondary | ICD-10-CM | POA: Diagnosis not present

## 2020-04-03 DIAGNOSIS — B078 Other viral warts: Secondary | ICD-10-CM | POA: Diagnosis not present

## 2020-04-30 ENCOUNTER — Other Ambulatory Visit: Payer: Self-pay

## 2020-04-30 DIAGNOSIS — S069X5S Unspecified intracranial injury with loss of consciousness greater than 24 hours with return to pre-existing conscious level, sequela: Secondary | ICD-10-CM

## 2020-04-30 DIAGNOSIS — G44309 Post-traumatic headache, unspecified, not intractable: Secondary | ICD-10-CM

## 2020-04-30 MED ORDER — METHYLPHENIDATE HCL 10 MG PO TABS: 15.0000 mg | ORAL_TABLET | Freq: Two times a day (BID) | ORAL | 0 refills | Status: DC

## 2020-04-30 NOTE — Telephone Encounter (Signed)
Requesting refill on Ritalin. Next appt 06/05/2020

## 2020-04-30 NOTE — Telephone Encounter (Signed)
Patient notified

## 2020-04-30 NOTE — Telephone Encounter (Signed)
Ritalin refilled

## 2020-05-07 ENCOUNTER — Telehealth: Payer: Self-pay

## 2020-05-07 DIAGNOSIS — S069X5S Unspecified intracranial injury with loss of consciousness greater than 24 hours with return to pre-existing conscious level, sequela: Secondary | ICD-10-CM

## 2020-05-07 DIAGNOSIS — G44309 Post-traumatic headache, unspecified, not intractable: Secondary | ICD-10-CM

## 2020-05-07 MED ORDER — METHYLPHENIDATE HCL 10 MG PO TABS: 15.0000 mg | ORAL_TABLET | Freq: Two times a day (BID) | ORAL | 0 refills | Status: DC

## 2020-05-07 NOTE — Telephone Encounter (Signed)
Notified. 

## 2020-05-07 NOTE — Telephone Encounter (Signed)
RF sent in.  thx

## 2020-05-29 ENCOUNTER — Ambulatory Visit: Payer: Medicare HMO | Admitting: Pharmacist

## 2020-05-29 ENCOUNTER — Other Ambulatory Visit: Payer: Self-pay

## 2020-05-29 DIAGNOSIS — I10 Essential (primary) hypertension: Secondary | ICD-10-CM

## 2020-05-29 DIAGNOSIS — E785 Hyperlipidemia, unspecified: Secondary | ICD-10-CM
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
## 2020-05-29 NOTE — Patient Instructions (Addendum)
Visit Information  Phone number for Pharmacist: [phone]  Thank you for meeting with me to discuss your medications! I look forward to working with you to achieve your health care goals. Below is a summary of what we talked about during the visit:  Goals Addressed            This Visit's Progress   . Pharmacy Care Plan       CARE PLAN ENTRY (see longitudinal plan of care for additional care plan information)  Current Barriers:  . Chronic Disease Management support, education, and care coordination needs related to Hypertension and Hyperlipidemia   Hypertension BP Readings from Last 3 Encounters:  03/15/20 110/60  02/07/20 133/86  11/13/19 132/82 .  Pharmacist Clinical Goal(s): o Over the next 365 days, patient will work with PharmD and providers to maintain BP goal <130/80 . Current regimen:  o Indapamide 1.25 mg daily o Propranolol 20 mg 3 times daily . Interventions: o Discussed BP goals and benefits of medications for prevention of heart attack / stroke . Patient self care activities - Over the next 365 days, patient will: o Check BP as needed, document, and provide at future appointments o Ensure daily salt intake < 2300 mg/day  Hyperlipidemia Lab Results  Component Value Date/Time   LDLCALC 100 (H) 08/15/2018 03:54 PM .  Pharmacist Clinical Goal(s): o Over the next 365 days, patient will work with PharmD and providers to maintain LDL goal < 130 . Current regimen:  o No medication . Interventions: o Discussed cholestesterol goals and benefits of medications for prevention of heart attack / stroke . Patient self care activities - Over the next 365 days, patient will: o Continue low cholesterol diet  Medication management . Pharmacist Clinical Goal(s): o Over the next 365 days, patient will work with PharmD and providers to maintain optimal medication adherence . Current pharmacy: [facility] mail order . Interventions o Comprehensive medication review  performed. o Continue current medication management strategy . Patient self care activities - Over the next 365 days, patient will: o Focus on medication adherence by fill date o Take medications as prescribed o Report any questions or concerns to PharmD and/or provider(s)  Initial goal documentation      Mr. Hoppes was given information about Chronic Care Management services today including:  1. CCM service includes personalized support from designated clinical staff supervised by his physician, including individualized plan of care and coordination with other care providers 2. 24/7 contact phone numbers for assistance for urgent and routine care needs. 3. Standard insurance, coinsurance, copays and deductibles apply for chronic care management only during months in which we provide at least 20 minutes of these services. Most insurances cover these services at 100%, however patients may be responsible for any copay, coinsurance and/or deductible if applicable. This service may help you avoid the need for more expensive face-to-face services. 4. Only one practitioner may furnish and bill the service in a calendar month. 5. The patient may stop CCM services at any time (effective at the end of the month) by phone call to the office staff.  Patient agreed to services and verbal consent obtained.   The patient verbalized understanding of instructions, educational materials, and care plan provided today and agreed to receive a mailed copy of patient instructions, educational materials, and care plan.  Telephone follow up appointment with pharmacy team member scheduled for: 1 year  Charlene Brooke, PharmD, [facility] Clinical Pharmacist [facility] [phone]  How to  Take Your Blood Pressure Blood pressure is a measurement of how strongly your blood is pressing against the walls of your arteries. Arteries are blood vessels that carry blood from your heart throughout  your body. Your health care provider takes your blood pressure at each office visit. You can also take your own blood pressure at home with a blood pressure machine. You may need to take your own blood pressure:  To confirm a diagnosis of high blood pressure (hypertension).  To monitor your blood pressure over time.  To make sure your blood pressure medicine is working. Supplies needed: To take your blood pressure, you will need a blood pressure machine. You can buy a blood pressure machine, or blood pressure monitor, at most drugstores or online. There are several types of home blood pressure monitors. When choosing one, consider the following:  Choose a monitor that has an arm cuff.  Choose a cuff that wraps snugly around your upper arm. You should be able to fit only one finger between your arm and the cuff.  Do not choose a monitor that measures your blood pressure from your wrist or finger. Your health care provider can suggest a reliable monitor that will meet your needs. How to prepare To get the most accurate reading, avoid the following for 30 minutes before you check your blood pressure:  Drinking caffeine.  Drinking alcohol.  Eating.  Smoking.  Exercising. Five minutes before you check your blood pressure:  Empty your bladder.  Sit quietly without talking in a dining chair, rather than in a soft couch or armchair. How to take your blood pressure To check your blood pressure, follow the instructions in the manual that came with your blood pressure monitor. If you have a digital blood pressure monitor, the instructions may be as follows: 1. Sit up straight. 2. Place your feet on the floor. Do not cross your ankles or legs. 3. Rest your left arm at the level of your heart on a table or desk or on the arm of a chair. 4. Pull up your shirt sleeve. 5. Wrap the blood pressure cuff around the upper part of your left arm, 1 inch (2.5 cm) above your elbow. It is best to wrap  the cuff around bare skin. 6. Fit the cuff snugly around your arm. You should be able to place only one finger between the cuff and your arm. 7. Position the cord inside the groove of your elbow. 8. Press the power button. 9. Sit quietly while the cuff inflates and deflates. 10. Read the digital reading on the monitor screen and write it down (record it). 11. Wait 2-3 minutes, then repeat the steps, starting at step 1. What does my blood pressure reading mean? A blood pressure reading consists of a higher number over a lower number. Ideally, your blood pressure should be below 120/80. The first ("top") number is called the systolic pressure. It is a measure of the pressure in your arteries as your heart beats. The second ("bottom") number is called the diastolic pressure. It is a measure of the pressure in your arteries as the heart relaxes. Blood pressure is classified into four stages. The following are the stages for adults who do not have a short-term serious illness or a chronic condition. Systolic pressure and diastolic pressure are measured in a unit called mm Hg. Normal  Systolic pressure: below 326.  Diastolic pressure: below 80. Elevated  Systolic pressure: 712-458.  Diastolic pressure: below 80. Hypertension stage 1  Systolic pressure: 631-497.  Diastolic pressure: 02-63. Hypertension stage 2  Systolic pressure: 785 or above.  Diastolic pressure: 90 or above. You can have prehypertension or hypertension even if only the systolic or only the diastolic number in your reading is higher than normal. Follow these instructions at home:  Check your blood pressure as often as recommended by your health care provider.  Take your monitor to the next appointment with your health care provider to make sure: ? That you are using it correctly. ? That it provides accurate readings.  Be sure you understand what your goal blood pressure numbers are.  Tell your health care provider  if you are having any side effects from blood pressure medicine. Contact a health care provider if:  Your blood pressure is consistently high. Get help right away if:  Your systolic blood pressure is higher than 180.  Your diastolic blood pressure is higher than 110. This information is not intended to replace advice given to you by your health care provider. Make sure you discuss any questions you have with your health care provider. Document Revised: 05/28/2017 Document Reviewed: 11/22/2015 Elsevier Patient Education  2020 [employer].

## 2020-05-30 NOTE — Addendum Note (Signed)
Addended by: Hinda Kehr on: 05/30/2020 09:29 AM   Modules accepted: Orders

## 2020-06-05 ENCOUNTER — Other Ambulatory Visit: Payer: Self-pay

## 2020-06-05 ENCOUNTER — Encounter: Payer: Medicare HMO | Attending: Physical Medicine & Rehabilitation | Admitting: Physical Medicine & Rehabilitation

## 2020-06-05 ENCOUNTER — Encounter: Payer: Self-pay | Admitting: Physical Medicine & Rehabilitation

## 2020-06-05 VITALS — BP 131/67 | HR 63 | Temp 98.3°F | Ht 73.0 in | Wt 295.0 lb

## 2020-06-05 DIAGNOSIS — G44309 Post-traumatic headache, unspecified, not intractable: Secondary | ICD-10-CM | POA: Diagnosis not present

## 2020-06-05 DIAGNOSIS — S069X0S Unspecified intracranial injury without loss of consciousness, sequela: Secondary | ICD-10-CM | POA: Insufficient documentation

## 2020-06-05 DIAGNOSIS — F068 Other specified mental disorders due to known physiological condition: Secondary | ICD-10-CM | POA: Diagnosis not present

## 2020-06-05 DIAGNOSIS — S069X5S Unspecified intracranial injury with loss of consciousness greater than 24 hours with return to pre-existing conscious level, sequela: Secondary | ICD-10-CM | POA: Diagnosis not present

## 2020-06-05 MED ORDER — METHYLPHENIDATE HCL 10 MG PO TABS: 15.0000 mg | ORAL_TABLET | Freq: Two times a day (BID) | ORAL | 0 refills | Status: DC

## 2020-06-05 NOTE — Addendum Note (Signed)
Addended by: Alger Simons T on: 06/05/2020 03:01 PM   Modules accepted: Orders

## 2020-06-05 NOTE — Patient Instructions (Signed)
PLEASE FEEL FREE TO CALL OUR OFFICE WITH ANY PROBLEMS OR QUESTIONS ([phone])                                @                 @@               @@@                        @@@@                      @@@@@         @@@@@@                  @@@@@@@                @@@@@@@@              @@@@@@@@@             @@@@@@@@@@       IIII                  IIII                                                        HAPPY HOLIDAYS!!!!!  
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
## 2020-06-10 ENCOUNTER — Telehealth: Payer: Self-pay | Admitting: Physical Medicine & Rehabilitation

## 2020-06-10 DIAGNOSIS — S069X5S Unspecified intracranial injury with loss of consciousness greater than 24 hours with return to pre-existing conscious level, sequela: Secondary | ICD-10-CM

## 2020-06-10 DIAGNOSIS — G44309 Post-traumatic headache, unspecified, not intractable: Secondary | ICD-10-CM

## 2020-06-10 NOTE — Telephone Encounter (Signed)
Patient would like a call back about his Ritalin prescription, it was sent to wrong pharmacy.  Patient states it should be sent to [facility].  Please call patient at [phone].

## 2020-06-11 MED ORDER — METHYLPHENIDATE HCL 10 MG PO TABS: 15.0000 mg | ORAL_TABLET | Freq: Two times a day (BID) | ORAL | 0 refills | Status: DC

## 2020-06-11 NOTE — Telephone Encounter (Signed)
meds sent in

## 2020-06-11 NOTE — Telephone Encounter (Signed)
Mark [name] is canceling the Rx for Ritalin with [facility].  It needs to be sent to [facility]. The pharmacy default is on [place] now.

## 2020-07-03 ENCOUNTER — Other Ambulatory Visit: Payer: Self-pay | Admitting: Internal Medicine

## 2020-07-03 ENCOUNTER — Telehealth: Payer: Self-pay | Admitting: Internal Medicine

## 2020-07-03 DIAGNOSIS — I10 Essential (primary) hypertension: Secondary | ICD-10-CM

## 2020-07-03 DIAGNOSIS — J301 Allergic rhinitis due to pollen: Secondary | ICD-10-CM

## 2020-07-03 DIAGNOSIS — L2089 Other atopic dermatitis: Secondary | ICD-10-CM | POA: Diagnosis not present

## 2020-07-03 MED ORDER — LEVOCETIRIZINE DIHYDROCHLORIDE 5 MG PO TABS: 5.0000 mg | ORAL_TABLET | Freq: Every evening | ORAL | 1 refills | Status: DC

## 2020-07-03 MED ORDER — INDAPAMIDE 1.25 MG PO TABS: 1.2500 mg | ORAL_TABLET | Freq: Every day | ORAL | 0 refills | Status: DC

## 2020-07-03 NOTE — Telephone Encounter (Signed)
   1.Medication Requested:  indapamide (LOZOL) 1.25 MG tablet levocetirizine (XYZAL) 5 MG tablet  2. Pharmacy (Name, Street, [place]): [facility]  3. On Med List: yes  4. Last Visit with PCP: May 2021  5. Next visit date with PCP: patient states he will call back to schedule, he needs to see what day next week he has transportation   Agent: Please be advised that RX refills may take up to 3 business days. We ask that you follow-up with your pharmacy.

## 2020-07-10 ENCOUNTER — Ambulatory Visit (INDEPENDENT_AMBULATORY_CARE_PROVIDER_SITE_OTHER): Payer: Medicare HMO | Admitting: Internal Medicine

## 2020-07-10 ENCOUNTER — Encounter: Payer: Self-pay | Admitting: Internal Medicine

## 2020-07-10 ENCOUNTER — Other Ambulatory Visit: Payer: Self-pay

## 2020-07-10 VITALS — BP 126/84 | HR 62 | Temp 98.5°F | Resp 16 | Ht 73.0 in | Wt 293.0 lb

## 2020-07-10 DIAGNOSIS — J301 Allergic rhinitis due to pollen: Secondary | ICD-10-CM | POA: Diagnosis not present

## 2020-07-10 DIAGNOSIS — E785 Hyperlipidemia, unspecified: Secondary | ICD-10-CM

## 2020-07-10 DIAGNOSIS — N400 Enlarged prostate without lower urinary tract symptoms: Secondary | ICD-10-CM | POA: Diagnosis not present

## 2020-07-10 DIAGNOSIS — G44309 Post-traumatic headache, unspecified, not intractable: Secondary | ICD-10-CM | POA: Diagnosis not present

## 2020-07-10 DIAGNOSIS — Z0001 Encounter for general adult medical examination with abnormal findings: Secondary | ICD-10-CM | POA: Diagnosis not present

## 2020-07-10 DIAGNOSIS — I10 Essential (primary) hypertension: Secondary | ICD-10-CM

## 2020-07-10 DIAGNOSIS — S069X5S Unspecified intracranial injury with loss of consciousness greater than 24 hours with return to pre-existing conscious level, sequela: Secondary | ICD-10-CM | POA: Diagnosis not present

## 2020-07-10 LAB — LIPID PANEL
Cholesterol: 217 mg/dL — ABNORMAL HIGH (ref 0–200)
HDL: 30.8 mg/dL — ABNORMAL LOW (ref >39.00)
LDL Cholesterol: 162 mg/dL — ABNORMAL HIGH (ref 0–99)
NonHDL: 185.73
Total CHOL/HDL Ratio: 7
Triglycerides: 120 mg/dL (ref 0.0–149.0)
VLDL: 24 mg/dL (ref 0.0–40.0)

## 2020-07-10 LAB — CBC WITH DIFFERENTIAL/PLATELET
Basophils Absolute: 0 10*3/uL (ref 0.0–0.1)
Basophils Relative: 0.6 % (ref 0.0–3.0)
Eosinophils Absolute: 0.1 10*3/uL (ref 0.0–0.7)
Eosinophils Relative: 1.6 % (ref 0.0–5.0)
HCT: 44.3 % (ref 39.0–52.0)
Hemoglobin: 14.2 g/dL (ref 13.0–17.0)
Lymphocytes Relative: 48.4 % — ABNORMAL HIGH (ref 12.0–46.0)
Lymphs Abs: 2.3 10*3/uL (ref 0.7–4.0)
MCHC: 32 g/dL (ref 30.0–36.0)
MCV: 86.8 fl (ref 78.0–100.0)
Monocytes Absolute: 0.3 10*3/uL (ref 0.1–1.0)
Monocytes Relative: 5.9 % (ref 3.0–12.0)
Neutro Abs: 2.1 10*3/uL (ref 1.4–7.7)
Neutrophils Relative %: 43.5 % (ref 43.0–77.0)
Platelets: 171 10*3/uL (ref 150.0–400.0)
RBC: 5.1 Mil/uL (ref 4.22–5.81)
RDW: 14.2 % (ref 11.5–15.5)
WBC: 4.8 10*3/uL (ref 4.0–10.5)

## 2020-07-10 LAB — URINALYSIS, ROUTINE W REFLEX MICROSCOPIC
Bilirubin Urine: NEGATIVE
Hgb urine dipstick: NEGATIVE
Ketones, ur: NEGATIVE
Leukocytes,Ua: NEGATIVE
Nitrite: NEGATIVE
Specific Gravity, Urine: 1.025 (ref 1.000–1.030)
Total Protein, Urine: NEGATIVE
Urine Glucose: NEGATIVE
Urobilinogen, UA: 1 (ref 0.0–1.0)
pH: 6 (ref 5.0–8.0)

## 2020-07-10 LAB — HEPATIC FUNCTION PANEL
ALT: 19 U/L (ref 0–53)
AST: 15 U/L (ref 0–37)
Albumin: 4.3 g/dL (ref 3.5–5.2)
Alkaline Phosphatase: 68 U/L (ref 39–117)
Bilirubin, Direct: 0.1 mg/dL (ref 0.0–0.3)
Total Bilirubin: 0.6 mg/dL (ref 0.2–1.2)
Total Protein: 7.3 g/dL (ref 6.0–8.3)

## 2020-07-10 LAB — BASIC METABOLIC PANEL
BUN: 21 mg/dL (ref 6–23)
CO2: 32 mEq/L (ref 19–32)
Calcium: 9.6 mg/dL (ref 8.4–10.5)
Chloride: 101 mEq/L (ref 96–112)
Creatinine, Ser: 1.04 mg/dL (ref 0.40–1.50)
GFR: 82 mL/min (ref >60.00)
Glucose, Bld: 91 mg/dL (ref 70–99)
Potassium: 4 mEq/L (ref 3.5–5.1)
Sodium: 139 mEq/L (ref 135–145)

## 2020-07-10 LAB — TSH: TSH: 1.23 u[IU]/mL (ref 0.35–4.50)

## 2020-07-10 LAB — PSA: PSA: 2.34 ng/mL (ref 0.10–4.00)

## 2020-07-10 MED ORDER — PROPRANOLOL HCL 10 MG PO TABS: 10.0000 mg | ORAL_TABLET | Freq: Three times a day (TID) | ORAL | 0 refills | Status: DC

## 2020-07-10 NOTE — Progress Notes (Signed)
Subjective:  Patient ID: Lamont Snowball, male    DOB: 1965/10/20  Age: 55 y.o. MRN: [id number]  CC: Annual Exam, Hypertension, and Hyperlipidemia  This visit occurred during the SARS-CoV-2 public health emergency.  Safety protocols were in place, including screening questions prior to the visit, additional usage of staff PPE, and extensive cleaning of exam room while observing appropriate contact time as indicated for disinfecting solutions.    HPI Rustam Squillace presents for a CPX.  He wants to know his blood type.  He tells me his blood pressure has been well controlled.  He is active but does not exercise.  He denies headache, blurred vision, chest pain, shortness of breath, palpitations, edema, or fatigue.  Outpatient Medications Prior to Visit  Medication Sig Dispense Refill   azelastine (ASTELIN) 0.1 % nasal spray Place 2 sprays into both nostrils 2 (two) times daily. Use in each nostril as directed 90 mL 1   calcium-vitamin D (OSCAL WITH D) 500-200 MG-UNIT tablet Take 1 tablet by mouth.     citalopram (CELEXA) 40 MG tablet TAKE 1 TABLET(40 MG) BY MOUTH DAILY 90 tablet 1   indapamide (LOZOL) 1.25 MG tablet Take 1 tablet (1.25 mg total) by mouth daily. 90 tablet 0   levocetirizine (XYZAL) 5 MG tablet Take 1 tablet (5 mg total) by mouth every evening. 90 tablet 1   methylphenidate (RITALIN) 10 MG tablet Take 1.5 tablets (15 mg total) by mouth 2 (two) times daily with breakfast and lunch. At 0700 and 1200 daily 90 tablet 0   methylphenidate (RITALIN) 10 MG tablet Take 1.5 tablets (15 mg total) by mouth 2 (two) times daily with breakfast and lunch. At 0700 and 1200 daily 90 tablet 0   triamcinolone cream (KENALOG) 0.1 % Apply 1 application topically 2 (two) times daily.     vitamin C (ASCORBIC ACID) 250 MG tablet Take 500 mg by mouth daily.     propranolol (INDERAL) 20 MG tablet Take 1 tablet (20 mg total) by mouth 3 (three) times daily. 270 tablet 1   No facility-administered  medications prior to visit.    ROS Review of Systems  Constitutional: Negative.  Negative for diaphoresis and fatigue.  HENT: Negative.   Eyes: Negative.   Respiratory: Negative for cough, chest tightness, shortness of breath and wheezing.   Cardiovascular: Negative for chest pain, palpitations and leg swelling.  Gastrointestinal: Negative for abdominal pain, constipation, diarrhea, nausea and vomiting.  Endocrine: Negative.   Genitourinary: Negative.  Negative for difficulty urinating and frequency.  Musculoskeletal: Negative.  Negative for arthralgias and myalgias.  Skin: Negative.   Neurological: Negative.  Negative for dizziness and weakness.  Hematological: Negative for adenopathy. Does not bruise/bleed easily.  Psychiatric/Behavioral: Negative.     Objective:  BP 126/84    Pulse 62    Temp 98.5 F (36.9 C) (Oral)    Resp 16    Ht 6\' 1"  (1.854 m)    Wt 293 lb (132.9 kg)    SpO2 96%    BMI 38.66 kg/m   BP Readings from Last 3 Encounters:  07/10/20 126/84  06/05/20 131/67  03/15/20 110/60    Wt Readings from Last 3 Encounters:  07/10/20 293 lb (132.9 kg)  06/05/20 295 lb (133.8 kg)  03/15/20 293 lb (132.9 kg)    Physical Exam Vitals reviewed. Exam conducted with a chaperone present (His father).  Constitutional:      Appearance: Normal appearance.  HENT:     Nose: Nose normal.  Mouth/Throat:     Mouth: Mucous membranes are moist.  Eyes:     General: No scleral icterus.    Conjunctiva/sclera: Conjunctivae normal.  Cardiovascular:     Rate and Rhythm: Regular rhythm. Bradycardia present.     Heart sounds: No murmur heard. No gallop.      Comments: EKG- Sinus bradycardia, 59 bpm Moderate LVH No Q waves Pulmonary:     Effort: Pulmonary effort is normal.     Breath sounds: No stridor. No wheezing, rhonchi or rales.  Abdominal:     General: Abdomen is flat.     Palpations: There is no mass.     Tenderness: There is no abdominal tenderness. There is no  guarding.     Hernia: No hernia is present. There is no hernia in the left inguinal area or right inguinal area.  Genitourinary:    Pubic Area: No rash.      Penis: Normal and circumcised.      Testes: Normal.     Epididymis:     Right: Normal. Not inflamed or enlarged. No mass.     Left: Normal. Not inflamed or enlarged. No mass.     Prostate: Enlarged. Not tender and no nodules present.     Rectum: Normal. Guaiac result negative. No mass, tenderness, anal fissure, external hemorrhoid or internal hemorrhoid. Normal anal tone.  Musculoskeletal:        General: Normal range of motion.     Cervical back: Neck supple.     Right lower leg: No edema.     Left lower leg: No edema.  Lymphadenopathy:     Cervical: No cervical adenopathy.     Lower Body: No right inguinal adenopathy. No left inguinal adenopathy.  Skin:    General: Skin is warm and dry.     Findings: No rash.  Neurological:     General: No focal deficit present.     Mental Status: He is alert.  Psychiatric:        Mood and Affect: Mood normal.        Behavior: Behavior normal.     Lab Results  Component Value Date   WBC 4.8 07/10/2020   HGB 14.2 07/10/2020   HCT 44.3 07/10/2020   PLT 171.0 07/10/2020   GLUCOSE 91 07/10/2020   CHOL 217 (H) 07/10/2020   TRIG 120.0 07/10/2020   HDL 30.80 (L) 07/10/2020   LDLCALC 162 (H) 07/10/2020   ALT 19 07/10/2020   AST 15 07/10/2020   NA 139 07/10/2020   K 4.0 07/10/2020   CL 101 07/10/2020   CREATININE 1.04 07/10/2020   BUN 21 07/10/2020   CO2 32 07/10/2020   TSH 1.23 07/10/2020   PSA 2.34 07/10/2020   INR 1.02 09/28/2011   HGBA1C 5.5 05/06/2016    CT RENAL ABD W/WO  Result Date: 09/06/2018 CLINICAL DATA:  Followup indeterminate right renal cystic lesion. EXAM: CT ABDOMEN WITHOUT AND WITH CONTRAST TECHNIQUE: Multidetector CT imaging of the abdomen was performed following the standard protocol before and following the bolus administration of intravenous contrast.  CONTRAST:  130mL ISOVUE-300 IOPAMIDOL (ISOVUE-300) INJECTION 61% COMPARISON:  CT on 08/19/2016 FINDINGS: Lower chest: No acute findings. Hepatobiliary: Probable sub-cm hepatic cysts remain stable. No hepatic masses identified. Gallbladder is unremarkable. Pancreas:  No mass or inflammatory changes. Spleen:  Within normal limits in size and appearance. Adrenals/Urinary Tract: Normal adrenal glands. A 2 mm nonobstructing calculus is seen in the lower pole the left kidney. No evidence of hydronephrosis. Stable  sub-cm cyst in lower pole of right kidney. A mildly complex cyst with a few thin internal septations is seen in the upper pole of the right kidney measuring 2.2 cm, which is not significantly changed in size or appearance compared to prior study. Stomach/Bowel: Visualized portion unremarkable. Vascular/Lymphatic: No pathologically enlarged lymph nodes identified. No abdominal aortic aneurysm. IVC filter remains in appropriate position. Other:  None. Musculoskeletal:  No suspicious bone lesions identified. IMPRESSION: 1. Stable 2.2 cm Bosniak category 2 F cystic lesion in upper pole of right kidney. Recommend continued followup by abdomen CT without and with contrast in 12 months. This recommendation follows ACR consensus guidelines: Management of the Incidental Renal Mass on CT: A White Paper of the ACR Incidental Findings Committee. J Am Coll Radiol 2018;15:264-273. 2. Tiny nonobstructing left renal calculus. No evidence of hydronephrosis. Electronically Signed   By: Earle Gell M.D.   On: 09/06/2018 15:15    Assessment & Plan:   Jaspreet was seen today for annual exam, hypertension and hyperlipidemia.  Diagnoses and all orders for this visit:  Essential hypertension- His blood pressure is adequately well controlled but he has developed mild, asymptomatic bradycardia.  Will decrease the dose of propanolol by 50%.  Will continue the other antihypertensives without change. -     CBC with  Differential/Platelet; Future -     Basic metabolic panel; Future -     TSH; Future -     ABO; Future -     EKG 12-Lead -     propranolol (INDERAL) 10 MG tablet; Take 1 tablet (10 mg total) by mouth 3 (three) times daily. -     ABO -     TSH -     Basic metabolic panel -     CBC with Differential/Platelet  Benign prostatic hyperplasia without lower urinary tract symptoms- His PSA has risen slightly.  I have asked him to return in 4 months to have this rechecked. -     PSA; Future -     Urinalysis, Routine w reflex microscopic; Future -     ABO; Future -     ABO -     Urinalysis, Routine w reflex microscopic -     PSA  Hyperlipidemia with target LDL less than 130- His ASCVD risk score is just above 10% so I recommended that he take a statin for cardiovascular risk reduction. -     Lipid panel; Future -     Hepatic function panel; Future -     ABO; Future -     ABO -     Hepatic function panel -     Lipid panel  Encounter for general adult medical examination with abnormal findings- Exam completed, labs reviewed, vaccines reviewed and updated, patient education was given. 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 I  am also having him start on propranolol. Additionally, I am having him maintain his citalopram, azelastine, triamcinolone, vitamin C, calcium-vitamin D, methylphenidate, methylphenidate, indapamide, and levocetirizine.  Meds ordered this encounter  Medications   propranolol (INDERAL) 10 MG  tablet    Sig: Take 1 tablet (10 mg total) by mouth 3 (three) times daily.    Dispense:  270 tablet    Refill:  0     Follow-up: Return in about 6 months (around 01/07/2021).  Scarlette Calico, MD

## 2020-07-10 NOTE — Patient Instructions (Signed)

## 2020-07-11 LAB — EXTRA SPECIMEN

## 2020-07-11 LAB — ABO

## 2020-07-12 ENCOUNTER — Encounter: Payer: Self-pay | Admitting: Internal Medicine

## 2020-07-12 MED ORDER — ROSUVASTATIN CALCIUM 10 MG PO TABS: 10.0000 mg | ORAL_TABLET | Freq: Every day | ORAL | 1 refills | Status: DC

## 2020-07-22 DIAGNOSIS — H524 Presbyopia: Secondary | ICD-10-CM | POA: Diagnosis not present

## 2020-07-23 DIAGNOSIS — Z0100 Encounter for examination of eyes and vision without abnormal findings: Secondary | ICD-10-CM | POA: Diagnosis not present

## 2020-08-15 ENCOUNTER — Other Ambulatory Visit: Payer: Self-pay | Admitting: Internal Medicine

## 2020-08-15 DIAGNOSIS — I10 Essential (primary) hypertension: Secondary | ICD-10-CM

## 2020-08-26 ENCOUNTER — Telehealth: Payer: Self-pay | Admitting: *Deleted

## 2020-08-26 DIAGNOSIS — G44309 Post-traumatic headache, unspecified, not intractable: Secondary | ICD-10-CM

## 2020-08-26 DIAGNOSIS — S069X5S Unspecified intracranial injury with loss of consciousness greater than 24 hours with return to pre-existing conscious level, sequela: Secondary | ICD-10-CM

## 2020-08-26 MED ORDER — METHYLPHENIDATE HCL 10 MG PO TABS: 15.0000 mg | ORAL_TABLET | Freq: Two times a day (BID) | ORAL | 0 refills | Status: DC

## 2020-08-26 NOTE — Telephone Encounter (Signed)
Mark [name] is requesting a refill on his ritalin.  His last fill date was 07/11/20. Next appt is 10/02/20.

## 2020-08-26 NOTE — Telephone Encounter (Signed)
Ritalin RF'ed for 2 mos

## 2020-08-27 ENCOUNTER — Other Ambulatory Visit: Payer: Self-pay | Admitting: Internal Medicine

## 2020-08-27 ENCOUNTER — Telehealth: Payer: Self-pay | Admitting: Internal Medicine

## 2020-08-27 DIAGNOSIS — I10 Essential (primary) hypertension: Secondary | ICD-10-CM

## 2020-08-27 DIAGNOSIS — J301 Allergic rhinitis due to pollen: Secondary | ICD-10-CM

## 2020-08-27 MED ORDER — INDAPAMIDE 1.25 MG PO TABS: 1.2500 mg | ORAL_TABLET | Freq: Every day | ORAL | 1 refills | Status: DC

## 2020-08-27 MED ORDER — LEVOCETIRIZINE DIHYDROCHLORIDE 5 MG PO TABS: 5.0000 mg | ORAL_TABLET | Freq: Every evening | ORAL | 1 refills | Status: DC

## 2020-08-27 NOTE — Telephone Encounter (Signed)
Pt.notified

## 2020-08-27 NOTE — Telephone Encounter (Signed)
1.Medication Requested: indapamide (LOZOL) 1.25 MG tablet  levocetirizine (XYZAL) 5 MG tablet    2. Pharmacy (Name, [place], [facility]): [facility] - [place], [place]  3. On Med List: yes   4. Last Visit with PCP: 1.12.22  5. Next visit date with PCP: 7.12.22   Agent: Please be advised that RX refills may take up to 3 business days. We ask that you follow-up with your pharmacy.

## 2020-08-29 ENCOUNTER — Other Ambulatory Visit: Payer: Self-pay

## 2020-08-29 DIAGNOSIS — S069X5S Unspecified intracranial injury with loss of consciousness greater than 24 hours with return to pre-existing conscious level, sequela: Secondary | ICD-10-CM

## 2020-08-29 DIAGNOSIS — G44309 Post-traumatic headache, unspecified, not intractable: Secondary | ICD-10-CM

## 2020-08-30 MED ORDER — METHYLPHENIDATE HCL 10 MG PO TABS
15.0000 mg | ORAL_TABLET | Freq: Two times a day (BID) | ORAL | 0 refills | Status: DC
Start: 2020-08-30 — End: 2020-10-02

## 2020-08-30 NOTE — Telephone Encounter (Signed)
PMP was Reviewed: Ritalin e-scribe: one week supply, awaiting mail order. Shirlean Mylar spoke with Mr. Crutchfield regarding the above.

## 2020-08-30 NOTE — Telephone Encounter (Signed)
Mark [name] mail order Methylphenidate 10 MG has been called &  cancelled for next month. However the shipment already went out for this month.  And will not arrive for maybe 7 Days. Will you send in medication coverage for 7 days? Please send to [facility] in the chart. Mark [name] is currently out of his medication.    Patient has been advised to call back for his next refill.   Thank you.  Call back ph [phone].

## 2020-09-02 ENCOUNTER — Telehealth: Payer: Self-pay | Admitting: Pharmacist

## 2020-09-02 NOTE — Progress Notes (Signed)
Chronic Care Management Pharmacy Assistant   Name: Mark [name]  MRN: [id number] DOB: 24-Aug-1965  Reason for Encounter: Disease State   Conditions to be addressed/monitored: HTN  Medications: Outpatient Encounter Medications as of 09/02/2020  Medication Sig   azelastine (ASTELIN) 0.1 % nasal spray Place 2 sprays into both nostrils 2 (two) times daily. Use in each nostril as directed   calcium-vitamin D (OSCAL WITH D) 500-200 MG-UNIT tablet Take 1 tablet by mouth.   citalopram (CELEXA) 40 MG tablet TAKE 1 TABLET(40 MG) BY MOUTH DAILY   indapamide (LOZOL) 1.25 MG tablet Take 1 tablet (1.25 mg total) by mouth daily.   levocetirizine (XYZAL) 5 MG tablet Take 1 tablet (5 mg total) by mouth every evening.   methylphenidate (RITALIN) 10 MG tablet Take 1.5 tablets (15 mg total) by mouth 2 (two) times daily with breakfast and lunch. At 0700 and 1200 daily   methylphenidate (RITALIN) 10 MG tablet Take 1.5 tablets (15 mg total) by mouth 2 (two) times daily with breakfast and lunch. At 0700 and 1200 daily   propranolol (INDERAL) 10 MG tablet Take 1 tablet (10 mg total) by mouth 3 (three) times daily.   rosuvastatin (CRESTOR) 10 MG tablet Take 1 tablet (10 mg total) by mouth daily.   triamcinolone cream (KENALOG) 0.1 % Apply 1 application topically 2 (two) times daily.   vitamin C (ASCORBIC ACID) 250 MG tablet Take 500 mg by mouth daily.   No facility-administered encounter medications on file as of 09/02/2020.    Care Gaps:  Star Rating Drugs:     Reviewed chart prior to disease state call. Spoke with patient regarding BP  Recent Office Vitals: BP Readings from Last 3 Encounters:  07/10/20 126/84  06/05/20 131/67  03/15/20 110/60   Pulse Readings from Last 3 Encounters:  07/10/20 62  06/05/20 63  03/15/20 67    Wt Readings from Last 3 Encounters:  07/10/20 293 lb (132.9 kg)  06/05/20 295 lb (133.8 kg)  03/15/20 293 lb (132.9 kg)     Kidney Function Lab Results   Component Value Date/Time   CREATININE 1.04 07/10/2020 11:04 AM   CREATININE 1.01 11/13/2019 01:34 PM   GFR 82.00 07/10/2020 11:04 AM   GFRNONAA >90 11/17/2011 06:15 AM   GFRAA >90 11/17/2011 06:15 AM    BMP Latest Ref Rng & Units 07/10/2020 11/13/2019 08/15/2019  Glucose 70 - 99 mg/dL 91 102(H) 93  BUN 6 - 23 mg/dL 21 15 15   Creatinine 0.40 - 1.50 mg/dL 1.04 1.01 0.94  Sodium 135 - 145 mEq/L 139 136 134(L)  Potassium 3.5 - 5.1 mEq/L 4.0 4.4 5.4(H)  Chloride 96 - 112 mEq/L 101 101 101  CO2 19 - 32 mEq/L 32 32 30  Calcium 8.4 - 10.5 mg/dL 9.6 9.4 9.6      Current antihypertensive regimen:                -  indapamide (LOZOL) 1.25 MG tablet                - Propranolol (INDERAL) 10 MG tablet  How often are you checking your Blood Pressure?            Patient stated he has not been checking blood pressure at home. Informed patient to check his blood pressure once a day this week and i will check in next week to see how his blood pressure numbers have been   Current home BP readings: N/A   What recent  interventions/DTPs have been made by any provider to improve Blood Pressure control since last CPP Visit:     07-10-20 OV Scarlette Calico, MD) Patient was seen for a HTN follow up/ annual exam. Patient developed mild, asymptomatic bradycardia. Provider decreased dose of propanolol by 50%    Any recent hospitalizations or ED visits since last visit with CPP? No    What diet changes have been made to improve Blood Pressure Control?  o Patient stated no diet changes  What exercise is being done to improve your Blood Pressure Control?  o Patient stated no exercise changes   Adherence Review: Is the patient currently on ACE/ARB medication? No Does the patient have >5 day gap between last estimated fill dates? No   [place] ,[place] Pharmacist Assistant [phone]  Time spent:  21 Minutes

## 2020-10-02 ENCOUNTER — Other Ambulatory Visit: Payer: Self-pay

## 2020-10-02 ENCOUNTER — Encounter: Payer: Self-pay | Admitting: Physical Medicine & Rehabilitation

## 2020-10-02 ENCOUNTER — Encounter: Payer: Medicare HMO | Attending: Physical Medicine & Rehabilitation | Admitting: Physical Medicine & Rehabilitation

## 2020-10-02 VITALS — BP 129/83 | HR 61 | Temp 98.5°F | Ht 73.0 in | Wt 293.4 lb

## 2020-10-02 DIAGNOSIS — F068 Other specified mental disorders due to known physiological condition: Secondary | ICD-10-CM | POA: Diagnosis not present

## 2020-10-02 DIAGNOSIS — S069X5S Unspecified intracranial injury with loss of consciousness greater than 24 hours with return to pre-existing conscious level, sequela: Secondary | ICD-10-CM | POA: Diagnosis not present

## 2020-10-02 DIAGNOSIS — Z79899 Other long term (current) drug therapy: Secondary | ICD-10-CM | POA: Insufficient documentation

## 2020-10-02 DIAGNOSIS — Z79891 Long term (current) use of opiate analgesic: Secondary | ICD-10-CM | POA: Diagnosis not present

## 2020-10-02 DIAGNOSIS — Z5181 Encounter for therapeutic drug level monitoring: Secondary | ICD-10-CM | POA: Diagnosis not present

## 2020-10-02 DIAGNOSIS — G44309 Post-traumatic headache, unspecified, not intractable: Secondary | ICD-10-CM | POA: Diagnosis not present

## 2020-10-02 DIAGNOSIS — S069X0S Unspecified intracranial injury without loss of consciousness, sequela: Secondary | ICD-10-CM | POA: Insufficient documentation

## 2020-10-02 DIAGNOSIS — G894 Chronic pain syndrome: Secondary | ICD-10-CM | POA: Diagnosis not present

## 2020-10-02 MED ORDER — METHYLPHENIDATE HCL 10 MG PO TABS: 15.0000 mg | ORAL_TABLET | Freq: Two times a day (BID) | ORAL | 0 refills | Status: DC
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
## 2020-10-02 NOTE — Patient Instructions (Signed)
PLEASE FEEL FREE TO CALL OUR OFFICE WITH ANY PROBLEMS OR QUESTIONS ([phone])      

## 2020-10-04 ENCOUNTER — Telehealth: Payer: Self-pay | Admitting: *Deleted

## 2020-10-04 DIAGNOSIS — S069X5S Unspecified intracranial injury with loss of consciousness greater than 24 hours with return to pre-existing conscious level, sequela: Secondary | ICD-10-CM

## 2020-10-04 DIAGNOSIS — G44309 Post-traumatic headache, unspecified, not intractable: Secondary | ICD-10-CM

## 2020-10-04 NOTE — Telephone Encounter (Signed)
Mr Mark [name] called to say he is going to need his Ritalin to be re prescribed to [facility]. His message said for [place] to call him on Monday 10/07/20.

## 2020-10-07 MED ORDER — METHYLPHENIDATE HCL 10 MG PO TABS: 15.0000 mg | ORAL_TABLET | Freq: Two times a day (BID) | ORAL | 0 refills | Status: DC

## 2020-10-07 NOTE — Telephone Encounter (Signed)
I sent the May 4th rx to [facility].  thx

## 2020-10-07 NOTE — Telephone Encounter (Signed)
Just an FYI, Mark [name] will get the Ritalin sent to [facility] this time, but his insurance requires it to be sent to [facility] for further refills.

## 2020-10-08 LAB — TOXASSURE SELECT,+ANTIDEPR,UR

## 2020-10-09 ENCOUNTER — Telehealth: Payer: Self-pay | Admitting: *Deleted

## 2020-10-09 DIAGNOSIS — L2089 Other atopic dermatitis: Secondary | ICD-10-CM | POA: Diagnosis not present

## 2020-10-09 NOTE — Telephone Encounter (Signed)
Urine drug screen for this encounter is consistent for prescribed medication (RITALIN).

## 2020-10-10 ENCOUNTER — Other Ambulatory Visit: Payer: Self-pay | Admitting: Physical Medicine & Rehabilitation

## 2020-10-10 DIAGNOSIS — F32A Depression, unspecified: Secondary | ICD-10-CM

## 2020-10-15 ENCOUNTER — Telehealth: Payer: Self-pay | Admitting: Internal Medicine

## 2020-10-15 ENCOUNTER — Other Ambulatory Visit: Payer: Self-pay | Admitting: Internal Medicine

## 2020-10-15 DIAGNOSIS — F32A Depression, unspecified: Secondary | ICD-10-CM

## 2020-10-15 MED ORDER — CITALOPRAM HYDROBROMIDE 40 MG PO TABS: ORAL_TABLET | ORAL | 1 refills | Status: DC

## 2020-10-15 NOTE — Telephone Encounter (Signed)
citalopram (CELEXA) 40 MG tablet [place], [place] Phone:  [phone]  Fax:  [phone]     Last seen- 01.12.22 Next apt-07.12.22

## 2020-10-23 ENCOUNTER — Telehealth: Payer: Self-pay | Admitting: Internal Medicine

## 2020-10-23 ENCOUNTER — Other Ambulatory Visit: Payer: Self-pay | Admitting: Internal Medicine

## 2020-10-23 DIAGNOSIS — I10 Essential (primary) hypertension: Secondary | ICD-10-CM

## 2020-10-23 DIAGNOSIS — J301 Allergic rhinitis due to pollen: Secondary | ICD-10-CM

## 2020-10-23 MED ORDER — LEVOCETIRIZINE DIHYDROCHLORIDE 5 MG PO TABS: 5.0000 mg | ORAL_TABLET | Freq: Every evening | ORAL | 1 refills | Status: DC

## 2020-10-23 MED ORDER — INDAPAMIDE 1.25 MG PO TABS: 1.2500 mg | ORAL_TABLET | Freq: Every day | ORAL | 0 refills | Status: DC

## 2020-10-23 MED ORDER — PROPRANOLOL HCL 10 MG PO TABS: 10.0000 mg | ORAL_TABLET | Freq: Three times a day (TID) | ORAL | 0 refills | Status: DC

## 2020-10-23 NOTE — Telephone Encounter (Signed)
Patient requesting refill to [facility] for  levocetirizine (XYZAL) 5 MG tablet propranolol (INDERAL) 10 MG tablet indapamide (LOZOL) 1.25 MG tablet

## 2020-12-05 ENCOUNTER — Telehealth: Payer: Self-pay

## 2020-12-05 DIAGNOSIS — G44309 Post-traumatic headache, unspecified, not intractable: Secondary | ICD-10-CM

## 2020-12-05 DIAGNOSIS — S069X5S Unspecified intracranial injury with loss of consciousness greater than 24 hours with return to pre-existing conscious level, sequela: Secondary | ICD-10-CM

## 2020-12-05 NOTE — Telephone Encounter (Signed)
Please sent Rx refill to [facility] at patient request. Thank you.

## 2020-12-06 DIAGNOSIS — M79605 Pain in left leg: Secondary | ICD-10-CM | POA: Diagnosis not present

## 2020-12-06 DIAGNOSIS — M79642 Pain in left hand: Secondary | ICD-10-CM | POA: Diagnosis not present

## 2020-12-06 DIAGNOSIS — R2689 Other abnormalities of gait and mobility: Secondary | ICD-10-CM | POA: Diagnosis not present

## 2020-12-06 DIAGNOSIS — R531 Weakness: Secondary | ICD-10-CM | POA: Diagnosis not present

## 2020-12-06 DIAGNOSIS — M25512 Pain in left shoulder: Secondary | ICD-10-CM | POA: Diagnosis not present

## 2020-12-06 MED ORDER — METHYLPHENIDATE HCL 10 MG PO TABS: 15.0000 mg | ORAL_TABLET | Freq: Two times a day (BID) | ORAL | 0 refills | Status: DC

## 2020-12-06 NOTE — Telephone Encounter (Signed)
Rx'es sent including second for July

## 2020-12-09 DIAGNOSIS — M79605 Pain in left leg: Secondary | ICD-10-CM | POA: Diagnosis not present

## 2020-12-09 DIAGNOSIS — R2689 Other abnormalities of gait and mobility: Secondary | ICD-10-CM | POA: Diagnosis not present

## 2020-12-09 DIAGNOSIS — R531 Weakness: Secondary | ICD-10-CM | POA: Diagnosis not present

## 2020-12-09 DIAGNOSIS — M25512 Pain in left shoulder: Secondary | ICD-10-CM | POA: Diagnosis not present

## 2020-12-09 DIAGNOSIS — M79642 Pain in left hand: Secondary | ICD-10-CM | POA: Diagnosis not present

## 2020-12-10 DIAGNOSIS — M79642 Pain in left hand: Secondary | ICD-10-CM | POA: Diagnosis not present

## 2020-12-10 DIAGNOSIS — R2689 Other abnormalities of gait and mobility: Secondary | ICD-10-CM | POA: Diagnosis not present

## 2020-12-10 DIAGNOSIS — M25512 Pain in left shoulder: Secondary | ICD-10-CM | POA: Diagnosis not present

## 2020-12-10 DIAGNOSIS — M79605 Pain in left leg: Secondary | ICD-10-CM | POA: Diagnosis not present

## 2020-12-10 DIAGNOSIS — R531 Weakness: Secondary | ICD-10-CM | POA: Diagnosis not present

## 2020-12-14 ENCOUNTER — Other Ambulatory Visit: Payer: Self-pay | Admitting: Internal Medicine

## 2020-12-14 DIAGNOSIS — E785 Hyperlipidemia, unspecified: Secondary | ICD-10-CM

## 2020-12-16 DIAGNOSIS — M25512 Pain in left shoulder: Secondary | ICD-10-CM | POA: Diagnosis not present

## 2020-12-16 DIAGNOSIS — R2689 Other abnormalities of gait and mobility: Secondary | ICD-10-CM | POA: Diagnosis not present

## 2020-12-16 DIAGNOSIS — M79605 Pain in left leg: Secondary | ICD-10-CM | POA: Diagnosis not present

## 2020-12-16 DIAGNOSIS — M79642 Pain in left hand: Secondary | ICD-10-CM | POA: Diagnosis not present

## 2020-12-16 DIAGNOSIS — R531 Weakness: Secondary | ICD-10-CM | POA: Diagnosis not present

## 2020-12-17 ENCOUNTER — Telehealth: Payer: Self-pay | Admitting: Internal Medicine

## 2020-12-17 ENCOUNTER — Other Ambulatory Visit: Payer: Self-pay | Admitting: Internal Medicine

## 2020-12-17 DIAGNOSIS — M79642 Pain in left hand: Secondary | ICD-10-CM | POA: Diagnosis not present

## 2020-12-17 DIAGNOSIS — R2689 Other abnormalities of gait and mobility: Secondary | ICD-10-CM | POA: Diagnosis not present

## 2020-12-17 DIAGNOSIS — R531 Weakness: Secondary | ICD-10-CM | POA: Diagnosis not present

## 2020-12-17 DIAGNOSIS — J301 Allergic rhinitis due to pollen: Secondary | ICD-10-CM

## 2020-12-17 DIAGNOSIS — M25512 Pain in left shoulder: Secondary | ICD-10-CM | POA: Diagnosis not present

## 2020-12-17 DIAGNOSIS — I10 Essential (primary) hypertension: Secondary | ICD-10-CM

## 2020-12-17 DIAGNOSIS — M79605 Pain in left leg: Secondary | ICD-10-CM | POA: Diagnosis not present

## 2020-12-17 MED ORDER — LEVOCETIRIZINE DIHYDROCHLORIDE 5 MG PO TABS: 5.0000 mg | ORAL_TABLET | Freq: Every evening | ORAL | 1 refills | Status: DC

## 2020-12-17 MED ORDER — PROPRANOLOL HCL 10 MG PO TABS
10.0000 mg | ORAL_TABLET | Freq: Three times a day (TID) | ORAL | 0 refills | Status: DC
Start: 2020-12-17 — End: 2021-03-19

## 2020-12-17 NOTE — Telephone Encounter (Signed)
    Patient requesting refill for levocetirizine (XYZAL) 5 MG tablet  [facility] - [place], [place]

## 2020-12-17 NOTE — Telephone Encounter (Signed)
1.Medication Requested: propranolol (INDERAL) 10 MG tablet   2. Pharmacy (Name, [place], [place]): [facility] - [place], [place]   3. On Med List: yes  4. Last Visit with PCP: 07-10-20  5. Next visit date with PCP: 01-07-21   Agent: Please be advised that RX refills may take up to 3 business days. We ask that you follow-up with your pharmacy.

## 2020-12-31 DIAGNOSIS — R531 Weakness: Secondary | ICD-10-CM | POA: Diagnosis not present

## 2020-12-31 DIAGNOSIS — R2689 Other abnormalities of gait and mobility: Secondary | ICD-10-CM | POA: Diagnosis not present

## 2020-12-31 DIAGNOSIS — M25512 Pain in left shoulder: Secondary | ICD-10-CM | POA: Diagnosis not present

## 2020-12-31 DIAGNOSIS — M79605 Pain in left leg: Secondary | ICD-10-CM | POA: Diagnosis not present

## 2020-12-31 DIAGNOSIS — M79642 Pain in left hand: Secondary | ICD-10-CM | POA: Diagnosis not present

## 2021-01-07 ENCOUNTER — Ambulatory Visit (INDEPENDENT_AMBULATORY_CARE_PROVIDER_SITE_OTHER): Payer: Medicare HMO | Admitting: Internal Medicine

## 2021-01-07 ENCOUNTER — Other Ambulatory Visit: Payer: Self-pay

## 2021-01-07 ENCOUNTER — Encounter: Payer: Self-pay | Admitting: Internal Medicine

## 2021-01-07 VITALS — BP 134/84 | HR 68 | Temp 98.7°F | Resp 16 | Ht 73.0 in | Wt 290.0 lb

## 2021-01-07 DIAGNOSIS — I10 Essential (primary) hypertension: Secondary | ICD-10-CM

## 2021-01-07 DIAGNOSIS — G8110 Spastic hemiplegia affecting unspecified side: Secondary | ICD-10-CM | POA: Diagnosis not present

## 2021-01-07 DIAGNOSIS — M25512 Pain in left shoulder: Secondary | ICD-10-CM | POA: Diagnosis not present

## 2021-01-07 DIAGNOSIS — R2689 Other abnormalities of gait and mobility: Secondary | ICD-10-CM | POA: Diagnosis not present

## 2021-01-07 DIAGNOSIS — J301 Allergic rhinitis due to pollen: Secondary | ICD-10-CM

## 2021-01-07 DIAGNOSIS — N400 Enlarged prostate without lower urinary tract symptoms: Secondary | ICD-10-CM | POA: Diagnosis not present

## 2021-01-07 DIAGNOSIS — M79605 Pain in left leg: Secondary | ICD-10-CM | POA: Diagnosis not present

## 2021-01-07 DIAGNOSIS — R531 Weakness: Secondary | ICD-10-CM | POA: Diagnosis not present

## 2021-01-07 DIAGNOSIS — M79642 Pain in left hand: Secondary | ICD-10-CM | POA: Diagnosis not present

## 2021-01-07 LAB — BASIC METABOLIC PANEL
BUN: 21 mg/dL (ref 6–23)
CO2: 28 mEq/L (ref 19–32)
Calcium: 9.9 mg/dL (ref 8.4–10.5)
Chloride: 104 mEq/L (ref 96–112)
Creatinine, Ser: 0.92 mg/dL (ref 0.40–1.50)
GFR: 94.66 mL/min (ref >60.00)
Glucose, Bld: 91 mg/dL (ref 70–99)
Potassium: 4.5 mEq/L (ref 3.5–5.1)
Sodium: 142 mEq/L (ref 135–145)

## 2021-01-07 LAB — PSA: PSA: 1.25 ng/mL (ref 0.10–4.00)

## 2021-01-07 MED ORDER — LEVOCETIRIZINE DIHYDROCHLORIDE 5 MG PO TABS
5.0000 mg | ORAL_TABLET | Freq: Every evening | ORAL | 1 refills | Status: DC
Start: 2021-01-07 — End: 2021-10-15

## 2021-01-07 MED ORDER — INDAPAMIDE 1.25 MG PO TABS: 1.2500 mg | ORAL_TABLET | Freq: Every day | ORAL | 1 refills | Status: DC

## 2021-01-07 NOTE — Patient Instructions (Signed)

## 2021-01-07 NOTE — Progress Notes (Signed)
Subjective:  Patient ID: Mark [name], male    DOB: 10-01-1965  Age: 55 y.o. MRN: [id number]  CC: Hypertension  This visit occurred during the SARS-CoV-2 public health emergency.  Safety protocols were in place, including screening questions prior to the visit, additional usage of staff PPE, and extensive cleaning of exam room while observing appropriate contact time as indicated for disinfecting solutions.    HPI Mark [name] presents for f/up -  He is with his brother today.  His brother has noticed over the last few years that his gait is declining.  He is seeing a physiatrist and doing physical therapy but he does not think that physical therapy is helping with this.  He notices the trouble walking when he has to move side to side quickly.  He tells me his blood pressure is well controlled.  He is active and denies any recent episodes of chest pain, shortness of breath, dizziness, lightheadedness, or edema.  Outpatient Medications Prior to Visit  Medication Sig Dispense Refill   azelastine (ASTELIN) 0.1 % nasal spray Place 2 sprays into both nostrils 2 (two) times daily. Use in each nostril as directed 90 mL 1   calcium-vitamin D (OSCAL WITH D) 500-200 MG-UNIT tablet Take 1 tablet by mouth.     citalopram (CELEXA) 40 MG tablet TAKE 1 TABLET(40 MG) BY MOUTH DAILY 90 tablet 1   methylphenidate (RITALIN) 10 MG tablet Take 1.5 tablets (15 mg total) by mouth 2 (two) times daily with breakfast and lunch. At 0700 and 1200 daily 90 tablet 0   methylphenidate (RITALIN) 10 MG tablet Take 1.5 tablets (15 mg total) by mouth 2 (two) times daily with breakfast and lunch. At 0700 and 1200 daily 90 tablet 0   propranolol (INDERAL) 10 MG tablet Take 1 tablet (10 mg total) by mouth 3 (three) times daily. 270 tablet 0   rosuvastatin (CRESTOR) 10 MG tablet TAKE 1 TABLET EVERY DAY 90 tablet 1   triamcinolone cream (KENALOG) 0.1 % Apply 1 application topically 2 (two) times daily.     indapamide (LOZOL) 1.25  MG tablet Take 1 tablet (1.25 mg total) by mouth daily. 90 tablet 0   levocetirizine (XYZAL) 5 MG tablet Take 1 tablet (5 mg total) by mouth every evening. 90 tablet 1   vitamin C (ASCORBIC ACID) 250 MG tablet Take 500 mg by mouth daily.     No facility-administered medications prior to visit.    ROS Review of Systems  Constitutional:  Negative for diaphoresis and fatigue.  HENT: Negative.    Eyes: Negative.   Cardiovascular:  Negative for chest pain, palpitations and leg swelling.  Gastrointestinal:  Negative for abdominal pain, constipation, nausea and vomiting.  Endocrine: Negative.   Genitourinary: Negative.  Negative for difficulty urinating.  Musculoskeletal:  Positive for gait problem. Negative for arthralgias and myalgias.  Skin: Negative.   Neurological:  Negative for dizziness, weakness and light-headedness.  Hematological:  Negative for adenopathy. Does not bruise/bleed easily.  Psychiatric/Behavioral: Negative.     Objective:  BP 134/84 (BP Location: Right Arm, Patient Position: Sitting, Cuff Size: Large)   Pulse 68   Temp 98.7 F (37.1 C) (Oral)   Resp 16   Ht 6\' 1"  (1.854 m)   Wt 290 lb (131.5 kg)   SpO2 97%   BMI 38.26 kg/m   BP Readings from Last 3 Encounters:  01/07/21 134/84  10/02/20 129/83  07/10/20 126/84    Wt Readings from Last 3 Encounters:  01/07/21 290  lb (131.5 kg)  10/02/20 293 lb 6.4 oz (133.1 kg)  07/10/20 293 lb (132.9 kg)    Physical Exam Vitals reviewed.  Constitutional:      Appearance: He is obese.  HENT:     Nose: Nose normal.     Mouth/Throat:     Mouth: Mucous membranes are moist.  Eyes:     General: No scleral icterus.    Conjunctiva/sclera: Conjunctivae normal.  Cardiovascular:     Rate and Rhythm: Normal rate and regular rhythm.     Heart sounds: No murmur heard. Pulmonary:     Effort: Pulmonary effort is normal.     Breath sounds: No stridor. No wheezing, rhonchi or rales.  Abdominal:     General: Abdomen is  protuberant. Bowel sounds are normal. There is no distension.     Palpations: Abdomen is soft. There is no hepatomegaly, splenomegaly or mass.  Musculoskeletal:        General: Normal range of motion.     Cervical back: Neck supple.     Right lower leg: No edema.     Left lower leg: No edema.  Lymphadenopathy:     Cervical: No cervical adenopathy.  Skin:    General: Skin is warm and dry.  Neurological:     General: No focal deficit present.     Mental Status: He is alert. Mental status is at baseline.     Gait: Gait abnormal.  Psychiatric:        Mood and Affect: Mood normal.        Behavior: Behavior normal.    Lab Results  Component Value Date   WBC 4.8 07/10/2020   HGB 14.2 07/10/2020   HCT 44.3 07/10/2020   PLT 171.0 07/10/2020   GLUCOSE 91 01/07/2021   CHOL 217 (H) 07/10/2020   TRIG 120.0 07/10/2020   HDL 30.80 (L) 07/10/2020   LDLCALC 162 (H) 07/10/2020   ALT 19 07/10/2020   AST 15 07/10/2020   NA 142 01/07/2021   K 4.5 01/07/2021   CL 104 01/07/2021   CREATININE 0.92 01/07/2021   BUN 21 01/07/2021   CO2 28 01/07/2021   TSH 1.23 07/10/2020   PSA 1.25 01/07/2021   INR 1.02 09/28/2011   HGBA1C 5.5 05/06/2016    CT RENAL ABD W/WO  Result Date: 09/06/2018 CLINICAL DATA:  Followup indeterminate right renal cystic lesion. EXAM: CT ABDOMEN WITHOUT AND WITH CONTRAST TECHNIQUE: Multidetector CT imaging of the abdomen was performed following the standard protocol before and following the bolus administration of intravenous contrast. CONTRAST:  17mL ISOVUE-300 IOPAMIDOL (ISOVUE-300) INJECTION 61% COMPARISON:  CT on 08/19/2016 FINDINGS: Lower chest: No acute findings. Hepatobiliary: Probable sub-cm hepatic cysts remain stable. No hepatic masses identified. Gallbladder is unremarkable. Pancreas:  No mass or inflammatory changes. Spleen:  Within normal limits in size and appearance. Adrenals/Urinary Tract: Normal adrenal glands. A 2 mm nonobstructing calculus is seen in the  lower pole the left kidney. No evidence of hydronephrosis. Stable sub-cm cyst in lower pole of right kidney. A mildly complex cyst with a few thin internal septations is seen in the upper pole of the right kidney measuring 2.2 cm, which is not significantly changed in size or appearance compared to prior study. Stomach/Bowel: Visualized portion unremarkable. Vascular/Lymphatic: No pathologically enlarged lymph nodes identified. No abdominal aortic aneurysm. IVC filter remains in appropriate position. Other:  None. Musculoskeletal:  No suspicious bone lesions identified. IMPRESSION: 1. Stable 2.2 cm Bosniak category 2 F cystic lesion in upper pole  of right kidney. Recommend continued followup by abdomen CT without and with contrast in 12 months. This recommendation follows ACR consensus guidelines: Management of the Incidental Renal Mass on CT: A White Paper of the ACR Incidental Findings Committee. J Am Coll Radiol 2018;15:264-273. 2. Tiny nonobstructing left renal calculus. No evidence of hydronephrosis. Electronically Signed   By: Earle Gell M.D.   On: 09/06/2018 15:15    Assessment & Plan:   Mark [name] was seen today for hypertension.  Diagnoses and all orders for this visit:  Benign prostatic hyperplasia without lower urinary tract symptoms- His PSA is lower than it was previously.  This is a reassuring sign that he does not have prostate cancer. -     PSA; Future -     PSA  Seasonal allergic rhinitis due to pollen -     levocetirizine (XYZAL) 5 MG tablet; Take 1 tablet (5 mg total) by mouth every evening.  Essential hypertension- His BP is well controlled.  Electrolytes and renal function are normal. -     indapamide (LOZOL) 1.25 MG tablet; Take 1 tablet (1.25 mg total) by mouth daily. -     Basic metabolic panel; Future -     Basic metabolic panel  Spastic hemiplegia affecting dominant side ([place])- I encouraged him to discuss his gait disturbance with his physical therapist and  physiatrist.  I have discontinued Diego Nickless's vitamin C. I am also having him maintain his azelastine, triamcinolone cream, calcium-vitamin D, citalopram, methylphenidate, methylphenidate, rosuvastatin, propranolol, levocetirizine, and indapamide.  Meds ordered this encounter  Medications   levocetirizine (XYZAL) 5 MG tablet    Sig: Take 1 tablet (5 mg total) by mouth every evening.    Dispense:  90 tablet    Refill:  1   indapamide (LOZOL) 1.25 MG tablet    Sig: Take 1 tablet (1.25 mg total) by mouth daily.    Dispense:  90 tablet    Refill:  1      Follow-up: Return in about 6 months (around 07/10/2021).  Scarlette Calico, MD

## 2021-01-08 ENCOUNTER — Encounter: Payer: Self-pay | Admitting: Internal Medicine

## 2021-01-09 ENCOUNTER — Other Ambulatory Visit: Payer: Self-pay | Admitting: Internal Medicine

## 2021-01-09 DIAGNOSIS — Z79899 Other long term (current) drug therapy: Secondary | ICD-10-CM | POA: Diagnosis not present

## 2021-01-09 DIAGNOSIS — L2089 Other atopic dermatitis: Secondary | ICD-10-CM | POA: Diagnosis not present

## 2021-01-09 DIAGNOSIS — I10 Essential (primary) hypertension: Secondary | ICD-10-CM

## 2021-01-13 ENCOUNTER — Telehealth: Payer: Self-pay | Admitting: Pharmacist

## 2021-01-13 DIAGNOSIS — R2689 Other abnormalities of gait and mobility: Secondary | ICD-10-CM | POA: Diagnosis not present

## 2021-01-13 DIAGNOSIS — M79605 Pain in left leg: Secondary | ICD-10-CM | POA: Diagnosis not present

## 2021-01-13 DIAGNOSIS — M25512 Pain in left shoulder: Secondary | ICD-10-CM | POA: Diagnosis not present

## 2021-01-13 DIAGNOSIS — M79642 Pain in left hand: Secondary | ICD-10-CM | POA: Diagnosis not present

## 2021-01-13 DIAGNOSIS — R531 Weakness: Secondary | ICD-10-CM | POA: Diagnosis not present

## 2021-01-13 NOTE — Chronic Care Management (AMB) (Signed)
Chronic Care Management Pharmacy Assistant   Name: Mark [name]  MRN: [id number] DOB: 11/20/1965  Reason for Encounter: Disease State Hypertension   Recent office visits:  01/07/21 Scarlette Calico MD(PCP)- Patient seen for Hypertension. Discontinued Vitamin C and Started Levocetirizine 5 mg and Indapamide 1.25 mg. Follow up 6 months.  Recent consult visits:  10/02/20 Tawni Levy MD(Physical Medicine and Rehab)- Patient seen for Chronic pain. Follow up in 4 months.  Hospital visits:  None in previous 6 months  Medications: Outpatient Encounter Medications as of 01/13/2021  Medication Sig   azelastine (ASTELIN) 0.1 % nasal spray Place 2 sprays into both nostrils 2 (two) times daily. Use in each nostril as directed   calcium-vitamin D (OSCAL WITH D) 500-200 MG-UNIT tablet Take 1 tablet by mouth.   citalopram (CELEXA) 40 MG tablet TAKE 1 TABLET(40 MG) BY MOUTH DAILY   indapamide (LOZOL) 1.25 MG tablet TAKE 1 TABLET EVERY DAY   levocetirizine (XYZAL) 5 MG tablet Take 1 tablet (5 mg total) by mouth every evening.   methylphenidate (RITALIN) 10 MG tablet Take 1.5 tablets (15 mg total) by mouth 2 (two) times daily with breakfast and lunch. At 0700 and 1200 daily   methylphenidate (RITALIN) 10 MG tablet Take 1.5 tablets (15 mg total) by mouth 2 (two) times daily with breakfast and lunch. At 0700 and 1200 daily   propranolol (INDERAL) 10 MG tablet Take 1 tablet (10 mg total) by mouth 3 (three) times daily.   rosuvastatin (CRESTOR) 10 MG tablet TAKE 1 TABLET EVERY DAY   triamcinolone cream (KENALOG) 0.1 % Apply 1 application topically 2 (two) times daily.   No facility-administered encounter medications on file as of 01/13/2021.    Reviewed chart prior to disease state call. Spoke with patient regarding BP  Recent Office Vitals: BP Readings from Last 3 Encounters:  01/07/21 134/84  10/02/20 129/83  07/10/20 126/84   Pulse Readings from Last 3 Encounters:  01/07/21 68  10/02/20 61   07/10/20 62    Wt Readings from Last 3 Encounters:  01/07/21 290 lb (131.5 kg)  10/02/20 293 lb 6.4 oz (133.1 kg)  07/10/20 293 lb (132.9 kg)     Kidney Function Lab Results  Component Value Date/Time   CREATININE 0.92 01/07/2021 10:38 AM   CREATININE 1.04 07/10/2020 11:04 AM   GFR 94.66 01/07/2021 10:38 AM   GFRNONAA >90 11/17/2011 06:15 AM   GFRAA >90 11/17/2011 06:15 AM    BMP Latest Ref Rng & Units 01/07/2021 07/10/2020 11/13/2019  Glucose 70 - 99 mg/dL 91 91 102(H)  BUN 6 - 23 mg/dL 21 21 15   Creatinine 0.40 - 1.50 mg/dL 0.92 1.04 1.01  Sodium 135 - 145 mEq/L 142 139 136  Potassium 3.5 - 5.1 mEq/L 4.5 4.0 4.4  Chloride 96 - 112 mEq/L 104 101 101  CO2 19 - 32 mEq/L 28 32 32  Calcium 8.4 - 10.5 mg/dL 9.9 9.6 9.4    Current antihypertensive regimen:  indapamide (LOZOL) 1.25 MG tablet Propranolol (INDERAL) 10 MG tablet  How often are you checking your Blood Pressure? 3-5x per week  Current home BP readings:   Patient states he was unable to locate his readings.  What recent interventions/DTPs have been made by any provider to improve Blood Pressure control since last CPP Visit:   None Noted  Any recent hospitalizations or ED visits since last visit with CPP? No  What diet changes have been made to improve Blood Pressure Control?  Patient stated he's  eating more vegetables, eating in smaller portions and drinking more water.  What exercise is being done to improve your Blood Pressure Control?  Patient stated he's participating in [employer] to help with his balance.  Adherence Review: Is the patient currently on ACE/ARB medication? No Does the patient have >5 day gap between last estimated fill dates? No   Star Rating Drugs: Rosuvastatin 10 mg last filled 10/07/20 90 DS   Orinda Kenner, RMA Clinical Pharmacists Assistant [phone]  Time Spent: [phone]

## 2021-01-13 NOTE — Progress Notes (Addendum)
  OPENED IN ERROR  Review of Systems  Physical Exam

## 2021-01-14 DIAGNOSIS — M25512 Pain in left shoulder: Secondary | ICD-10-CM | POA: Diagnosis not present

## 2021-01-14 DIAGNOSIS — R2689 Other abnormalities of gait and mobility: Secondary | ICD-10-CM | POA: Diagnosis not present

## 2021-01-14 DIAGNOSIS — R531 Weakness: Secondary | ICD-10-CM | POA: Diagnosis not present

## 2021-01-14 DIAGNOSIS — M79605 Pain in left leg: Secondary | ICD-10-CM | POA: Diagnosis not present

## 2021-01-14 DIAGNOSIS — M79642 Pain in left hand: Secondary | ICD-10-CM | POA: Diagnosis not present

## 2021-01-27 DIAGNOSIS — M79605 Pain in left leg: Secondary | ICD-10-CM | POA: Diagnosis not present

## 2021-01-27 DIAGNOSIS — R2689 Other abnormalities of gait and mobility: Secondary | ICD-10-CM | POA: Diagnosis not present

## 2021-01-27 DIAGNOSIS — M25512 Pain in left shoulder: Secondary | ICD-10-CM | POA: Diagnosis not present

## 2021-01-27 DIAGNOSIS — M79642 Pain in left hand: Secondary | ICD-10-CM | POA: Diagnosis not present

## 2021-01-27 DIAGNOSIS — R531 Weakness: Secondary | ICD-10-CM | POA: Diagnosis not present

## 2021-01-28 DIAGNOSIS — R6889 Other general symptoms and signs: Secondary | ICD-10-CM | POA: Diagnosis not present

## 2021-01-28 DIAGNOSIS — M25512 Pain in left shoulder: Secondary | ICD-10-CM | POA: Diagnosis not present

## 2021-01-28 DIAGNOSIS — M79605 Pain in left leg: Secondary | ICD-10-CM | POA: Diagnosis not present

## 2021-01-28 DIAGNOSIS — R2689 Other abnormalities of gait and mobility: Secondary | ICD-10-CM | POA: Diagnosis not present

## 2021-01-28 DIAGNOSIS — R531 Weakness: Secondary | ICD-10-CM | POA: Diagnosis not present

## 2021-01-28 DIAGNOSIS — M79642 Pain in left hand: Secondary | ICD-10-CM | POA: Diagnosis not present

## 2021-01-29 ENCOUNTER — Encounter: Payer: Medicare HMO | Attending: Physical Medicine & Rehabilitation | Admitting: Physical Medicine & Rehabilitation

## 2021-01-29 ENCOUNTER — Encounter: Payer: Self-pay | Admitting: Physical Medicine & Rehabilitation

## 2021-01-29 ENCOUNTER — Other Ambulatory Visit: Payer: Self-pay

## 2021-01-29 VITALS — BP 134/85 | HR 64 | Temp 99.1°F | Ht 73.0 in | Wt 293.2 lb

## 2021-01-29 DIAGNOSIS — S069X5S Unspecified intracranial injury with loss of consciousness greater than 24 hours with return to pre-existing conscious level, sequela: Secondary | ICD-10-CM | POA: Diagnosis not present

## 2021-01-29 DIAGNOSIS — G8110 Spastic hemiplegia affecting unspecified side: Secondary | ICD-10-CM | POA: Insufficient documentation

## 2021-01-29 DIAGNOSIS — G44309 Post-traumatic headache, unspecified, not intractable: Secondary | ICD-10-CM | POA: Insufficient documentation

## 2021-01-29 MED ORDER — METHYLPHENIDATE HCL 10 MG PO TABS: 15.0000 mg | ORAL_TABLET | Freq: Two times a day (BID) | ORAL | 0 refills | Status: DC

## 2021-01-29 NOTE — Patient Instructions (Addendum)
PLEASE FEEL FREE TO CALL OUR OFFICE WITH ANY PROBLEMS OR QUESTIONS [phone]   LOOK INTO SOME MINDFULNESS TECHNIQUES TO HELP YOU RELAX.
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
## 2021-02-03 DIAGNOSIS — M79605 Pain in left leg: Secondary | ICD-10-CM | POA: Diagnosis not present

## 2021-02-03 DIAGNOSIS — R2689 Other abnormalities of gait and mobility: Secondary | ICD-10-CM | POA: Diagnosis not present

## 2021-02-03 DIAGNOSIS — M25512 Pain in left shoulder: Secondary | ICD-10-CM | POA: Diagnosis not present

## 2021-02-03 DIAGNOSIS — R6889 Other general symptoms and signs: Secondary | ICD-10-CM | POA: Diagnosis not present

## 2021-02-03 DIAGNOSIS — M79642 Pain in left hand: Secondary | ICD-10-CM | POA: Diagnosis not present

## 2021-02-03 DIAGNOSIS — R531 Weakness: Secondary | ICD-10-CM | POA: Diagnosis not present

## 2021-02-04 DIAGNOSIS — R2689 Other abnormalities of gait and mobility: Secondary | ICD-10-CM | POA: Diagnosis not present

## 2021-02-04 DIAGNOSIS — M79605 Pain in left leg: Secondary | ICD-10-CM | POA: Diagnosis not present

## 2021-02-04 DIAGNOSIS — M79642 Pain in left hand: Secondary | ICD-10-CM | POA: Diagnosis not present

## 2021-02-04 DIAGNOSIS — M25512 Pain in left shoulder: Secondary | ICD-10-CM | POA: Diagnosis not present

## 2021-02-04 DIAGNOSIS — R6889 Other general symptoms and signs: Secondary | ICD-10-CM | POA: Diagnosis not present

## 2021-02-04 DIAGNOSIS — R531 Weakness: Secondary | ICD-10-CM | POA: Diagnosis not present

## 2021-02-17 DIAGNOSIS — R2689 Other abnormalities of gait and mobility: Secondary | ICD-10-CM | POA: Diagnosis not present

## 2021-02-17 DIAGNOSIS — R6889 Other general symptoms and signs: Secondary | ICD-10-CM | POA: Diagnosis not present

## 2021-02-17 DIAGNOSIS — M79605 Pain in left leg: Secondary | ICD-10-CM | POA: Diagnosis not present

## 2021-02-17 DIAGNOSIS — R531 Weakness: Secondary | ICD-10-CM | POA: Diagnosis not present

## 2021-02-17 DIAGNOSIS — M25512 Pain in left shoulder: Secondary | ICD-10-CM | POA: Diagnosis not present

## 2021-02-17 DIAGNOSIS — M79642 Pain in left hand: Secondary | ICD-10-CM | POA: Diagnosis not present

## 2021-03-11 DIAGNOSIS — R2689 Other abnormalities of gait and mobility: Secondary | ICD-10-CM | POA: Diagnosis not present

## 2021-03-11 DIAGNOSIS — M25512 Pain in left shoulder: Secondary | ICD-10-CM | POA: Diagnosis not present

## 2021-03-11 DIAGNOSIS — R531 Weakness: Secondary | ICD-10-CM | POA: Diagnosis not present

## 2021-03-11 DIAGNOSIS — M79642 Pain in left hand: Secondary | ICD-10-CM | POA: Diagnosis not present

## 2021-03-11 DIAGNOSIS — R6889 Other general symptoms and signs: Secondary | ICD-10-CM | POA: Diagnosis not present

## 2021-03-11 DIAGNOSIS — M79605 Pain in left leg: Secondary | ICD-10-CM | POA: Diagnosis not present

## 2021-03-13 DIAGNOSIS — M25512 Pain in left shoulder: Secondary | ICD-10-CM | POA: Diagnosis not present

## 2021-03-13 DIAGNOSIS — M79605 Pain in left leg: Secondary | ICD-10-CM | POA: Diagnosis not present

## 2021-03-13 DIAGNOSIS — R6889 Other general symptoms and signs: Secondary | ICD-10-CM | POA: Diagnosis not present

## 2021-03-13 DIAGNOSIS — R2689 Other abnormalities of gait and mobility: Secondary | ICD-10-CM | POA: Diagnosis not present

## 2021-03-13 DIAGNOSIS — M79642 Pain in left hand: Secondary | ICD-10-CM | POA: Diagnosis not present

## 2021-03-13 DIAGNOSIS — R531 Weakness: Secondary | ICD-10-CM | POA: Diagnosis not present

## 2021-03-18 ENCOUNTER — Ambulatory Visit: Payer: Medicare HMO

## 2021-03-19 ENCOUNTER — Other Ambulatory Visit: Payer: Self-pay | Admitting: Internal Medicine

## 2021-03-19 DIAGNOSIS — M25512 Pain in left shoulder: Secondary | ICD-10-CM | POA: Diagnosis not present

## 2021-03-19 DIAGNOSIS — I10 Essential (primary) hypertension: Secondary | ICD-10-CM

## 2021-03-19 DIAGNOSIS — R531 Weakness: Secondary | ICD-10-CM | POA: Diagnosis not present

## 2021-03-19 DIAGNOSIS — M79642 Pain in left hand: Secondary | ICD-10-CM | POA: Diagnosis not present

## 2021-03-19 DIAGNOSIS — R6889 Other general symptoms and signs: Secondary | ICD-10-CM | POA: Diagnosis not present

## 2021-03-19 DIAGNOSIS — R2689 Other abnormalities of gait and mobility: Secondary | ICD-10-CM | POA: Diagnosis not present

## 2021-03-19 DIAGNOSIS — M79605 Pain in left leg: Secondary | ICD-10-CM | POA: Diagnosis not present

## 2021-03-23 ENCOUNTER — Other Ambulatory Visit: Payer: Self-pay | Admitting: Internal Medicine

## 2021-03-23 DIAGNOSIS — F32A Depression, unspecified: Secondary | ICD-10-CM

## 2021-04-10 ENCOUNTER — Telehealth: Payer: Self-pay

## 2021-04-10 NOTE — Progress Notes (Signed)
    Chronic Care Management Pharmacy Assistant   Name: Mark [name]  MRN: [id number] DOB: 12-15-1965   Reason for Encounter: Disease State   Conditions to be addressed/monitored: General   Recent office visits:  None ID  Recent consult visits:  01/29/21 Meredith Staggers, MD-Physical Medicine/Rehabilitation (Spastic hemiplegia affecting dominant side) no med changes  Hospital visits:  None in previous 6 months  Medications: Outpatient Encounter Medications as of 04/10/2021  Medication Sig   azelastine (ASTELIN) 0.1 % nasal spray Place 2 sprays into both nostrils 2 (two) times daily. Use in each nostril as directed   calcium-vitamin D (OSCAL WITH D) 500-200 MG-UNIT tablet Take 1 tablet by mouth.   citalopram (CELEXA) 40 MG tablet TAKE 1 TABLET EVERY DAY   indapamide (LOZOL) 1.25 MG tablet TAKE 1 TABLET EVERY DAY   levocetirizine (XYZAL) 5 MG tablet Take 1 tablet (5 mg total) by mouth every evening.   methylphenidate (RITALIN) 10 MG tablet Take 1.5 tablets (15 mg total) by mouth 2 (two) times daily with breakfast and lunch. At 0700 and 1200 daily   methylphenidate (RITALIN) 10 MG tablet Take 1.5 tablets (15 mg total) by mouth 2 (two) times daily with breakfast and lunch. At 0700 and 1200 daily   propranolol (INDERAL) 10 MG tablet TAKE 1 TABLET THREE TIMES DAILY   rosuvastatin (CRESTOR) 10 MG tablet TAKE 1 TABLET EVERY DAY   triamcinolone cream (KENALOG) 0.1 % Apply 1 application topically 2 (two) times daily.   No facility-administered encounter medications on file as of 04/10/2021.   Reviewed chart for medication changes and drug therapy problems ahead of medication adherence call.  Attempted to contact patient x 3 for medication review and health check, unable to reach patient, left voicemails to return call.    Care Gaps: Colonoscopy-NA Diabetic Foot Exam-NA Mammogram-NA Ophthalmology-NA Dexa Scan - NA Annual Well Visit - 07/10/20 Micro albumin-NA Hemoglobin A1c-  NA  Star Rating Drugs: Rosuvastatin 10 mg last filled 02/27/21 90 DS  Ethelene Hal Clinical Pharmacist Assistant [phone]

## 2021-04-15 ENCOUNTER — Telehealth: Payer: Self-pay

## 2021-04-15 DIAGNOSIS — S069X5S Unspecified intracranial injury with loss of consciousness greater than 24 hours with return to pre-existing conscious level, sequela: Secondary | ICD-10-CM

## 2021-04-15 DIAGNOSIS — G44309 Post-traumatic headache, unspecified, not intractable: Secondary | ICD-10-CM

## 2021-04-15 MED ORDER — METHYLPHENIDATE HCL 10 MG PO TABS: 15.0000 mg | ORAL_TABLET | Freq: Two times a day (BID) | ORAL | 0 refills | Status: DC

## 2021-04-15 NOTE — Telephone Encounter (Signed)
Patient notified

## 2021-04-15 NOTE — Telephone Encounter (Signed)
Sent in by Dr Naaman Plummer.

## 2021-04-15 NOTE — Telephone Encounter (Signed)
Patient is requesting a refill on Ritalin. Last fill date was 03/11/21.

## 2021-04-17 ENCOUNTER — Telehealth: Payer: Self-pay

## 2021-04-17 DIAGNOSIS — S069X5S Unspecified intracranial injury with loss of consciousness greater than 24 hours with return to pre-existing conscious level, sequela: Secondary | ICD-10-CM

## 2021-04-17 DIAGNOSIS — G44309 Post-traumatic headache, unspecified, not intractable: Secondary | ICD-10-CM

## 2021-04-17 MED ORDER — METHYLPHENIDATE HCL 10 MG PO TABS: 15.0000 mg | ORAL_TABLET | Freq: Two times a day (BID) | ORAL | 0 refills | Status: DC

## 2021-04-17 NOTE — Telephone Encounter (Signed)
New rx sent for November.

## 2021-04-17 NOTE — Telephone Encounter (Signed)
Mark [name] called and stated the second prescription of Ritalin has "DO NOT FILL UNTIL 07/14/20". They want to know if it is supposed to be 05/14/21. Please advise

## 2021-04-21 ENCOUNTER — Encounter: Payer: Self-pay | Admitting: Internal Medicine

## 2021-04-21 ENCOUNTER — Other Ambulatory Visit: Payer: Self-pay

## 2021-04-21 ENCOUNTER — Ambulatory Visit (INDEPENDENT_AMBULATORY_CARE_PROVIDER_SITE_OTHER): Payer: Medicare HMO | Admitting: Internal Medicine

## 2021-04-21 VITALS — BP 132/82 | HR 80 | Resp 18 | Ht 73.0 in | Wt 286.2 lb

## 2021-04-21 DIAGNOSIS — M5441 Lumbago with sciatica, right side: Secondary | ICD-10-CM

## 2021-04-21 MED ORDER — KETOROLAC TROMETHAMINE 30 MG/ML IJ SOLN
30.0000 mg | Freq: Once | INTRAMUSCULAR | Status: AC
  Administered 2021-04-21: 30 mg via INTRAMUSCULAR

## 2021-04-21 MED ORDER — METHYLPREDNISOLONE ACETATE 40 MG/ML IJ SUSP
40.0000 mg | Freq: Once | INTRAMUSCULAR | Status: AC
  Administered 2021-04-21: 40 mg via INTRAMUSCULAR

## 2021-04-21 MED ORDER — DICLOFENAC SODIUM 75 MG PO TBEC: 75.0000 mg | DELAYED_RELEASE_TABLET | Freq: Two times a day (BID) | ORAL | 0 refills | Status: DC

## 2021-04-21 NOTE — Assessment & Plan Note (Signed)
Given toradol 30 mg IM and depo-medrol 40 mg IM. Sugars and renal function verified and okay prior to injection. Rx diclofenac 2 week supply for pain today. No indication for imaging today.

## 2021-04-21 NOTE — Patient Instructions (Signed)
We have given you a toradol and steroid shot today. The toradol works today and the steroid kicks in within a day or two.  We have also sent in a prescription for diclofenac to use 1 pill up to 2 times a day for the next 1-2 weeks to help with pain.  It is okay to take tylenol or use biofreeze in addition to this.

## 2021-04-21 NOTE — Progress Notes (Signed)
   Subjective:   Patient ID: Mark [name], male    DOB: 1965-11-07, 55 y.o.   MRN: [id number]  Back Pain Pertinent negatives include no fever, numbness or weakness.  The patient is a 55 YO man coming in for low back pain acute.   Review of Systems  Constitutional:  Positive for activity change. Negative for appetite change, fatigue, fever and unexpected weight change.  Respiratory: Negative.    Cardiovascular: Negative.   Musculoskeletal:  Positive for back pain and myalgias. Negative for arthralgias.  Skin: Negative.   Neurological:  Negative for syncope, weakness and numbness.   Objective:  Physical Exam Constitutional:      Appearance: He is well-developed.  HENT:     Head: Normocephalic and atraumatic.  Cardiovascular:     Rate and Rhythm: Normal rate and regular rhythm.  Pulmonary:     Effort: Pulmonary effort is normal. No respiratory distress.     Breath sounds: Normal breath sounds. No wheezing or rales.  Abdominal:     General: Bowel sounds are normal. There is no distension.     Palpations: Abdomen is soft.     Tenderness: There is no abdominal tenderness. There is no rebound.  Musculoskeletal:        General: Tenderness present.     Cervical back: Normal range of motion.  Skin:    General: Skin is warm and dry.  Neurological:     Mental Status: He is alert and oriented to person, place, and time.     Coordination: Coordination normal.    Vitals:   04/21/21 1454  BP: 132/82  Pulse: 80  Resp: 18  SpO2: 97%  Weight: 286 lb 3.2 oz (129.8 kg)  Height: 6\' 1"  (1.854 m)    This visit occurred during the SARS-CoV-2 public health emergency.  Safety protocols were in place, including screening questions prior to the visit, additional usage of staff PPE, and extensive cleaning of exam room while observing appropriate contact time as indicated for disinfecting solutions.   Assessment & Plan:  Toradol 30 mg IM and Depo-medrol 40 mg IM given at visit

## 2021-05-26 ENCOUNTER — Telehealth: Payer: Medicare HMO

## 2021-05-28 ENCOUNTER — Encounter: Payer: Medicare HMO | Admitting: Physical Medicine & Rehabilitation

## 2021-06-06 ENCOUNTER — Telehealth: Payer: Medicare HMO

## 2021-06-06 NOTE — Progress Notes (Deleted)
Chronic Care Management Pharmacy Note  06/06/2021 Name:  Zacari Stiff MRN:  [id number] DOB:  [birth year]  Summary: ***  Recommendations/Changes made from today's visit: ***  Plan: ***  Subjective: Mark [name] is an 55 y.o. year old male who is a primary patient of Janith Lima, MD.  The CCM team was consulted for assistance with disease management and care coordination needs.    Engaged with patient by telephone for follow up visit in response to provider referral for pharmacy case management and/or care coordination services.   Consent to Services:  The patient was given the following information about Chronic Care Management services today, agreed to services, and gave verbal consent: 1. CCM service includes personalized support from designated clinical staff supervised by the primary care provider, including individualized plan of care and coordination with other care providers 2. 24/7 contact phone numbers for assistance for urgent and routine care needs. 3. Service will only be billed when office clinical staff spend 20 minutes or more in a month to coordinate care. 4. Only one practitioner may furnish and bill the service in a calendar month. 5.The patient may stop CCM services at any time (effective at the end of the month) by phone call to the office staff. 6. The patient will be responsible for cost sharing (co-pay) of up to 20% of the service fee (after annual deductible is met). Patient agreed to services and consent obtained.  Patient Care Team: Janith Lima, MD as PCP - General (Internal Medicine) Meredith Staggers, MD as Consulting Physician (Physical Medicine and Rehabilitation) Charlton Haws, [facility] as Pharmacist (Pharmacist)  Recent office visits: 04/21/2021 - Dr. Sharlet Salina - Acute bilateral low back pain - toradol 4m injection and depo-medrol injection given in office - 2 week voltaren prescription given  01/07/2021 - Dr. JRonnald Ramp- concerns about gait - to  follow up with PT and physiatrist about gait concerns  - vitamin C stopped   Recent consult visits: 01/29/2021 - Dr. SNaaman Plummer- Physical Medicine and Rehabilitation - no medication changes - f/u in 4 months   Hospital visits: None in previous 6 months  Objective:  Lab Results  Component Value Date   CREATININE 0.92 01/07/2021   BUN 21 01/07/2021   GFR 94.66 01/07/2021   GFRNONAA >90 11/17/2011   GFRAA >90 11/17/2011   NA 142 01/07/2021   K 4.5 01/07/2021   CALCIUM 9.9 01/07/2021   CO2 28 01/07/2021   GLUCOSE 91 01/07/2021    Lab Results  Component Value Date/Time   HGBA1C 5.5 05/06/2016 10:53 AM   HGBA1C 5.5 04/23/2015 11:20 AM   GFR 94.66 01/07/2021 10:38 AM   GFR 82.00 07/10/2020 11:04 AM    Last diabetic Eye exam:  No results found for: HMDIABEYEEXA  Last diabetic Foot exam:  No results found for: HMDIABFOOTEX   Lab Results  Component Value Date   CHOL 217 (H) 07/10/2020   HDL 30.80 (L) 07/10/2020   LDLCALC 162 (H) 07/10/2020   TRIG 120.0 07/10/2020   CHOLHDL 7 07/10/2020    Hepatic Function Latest Ref Rng & Units 07/10/2020 08/15/2018 07/20/2018  Total Protein 6.0 - 8.3 g/dL 7.3 6.8 7.0  Albumin 3.5 - 5.2 g/dL 4.3 3.9 4.0  AST 0 - 37 U/L 15 15 15   ALT 0 - 53 U/L 19 20 16   Alk Phosphatase 39 - 117 U/L 68 62 49  Total Bilirubin 0.2 - 1.2 mg/dL 0.6 0.4 0.5  Bilirubin, Direct 0.0 - 0.3 mg/dL 0.1 - -  Lab Results  Component Value Date/Time   TSH 1.23 07/10/2020 11:04 AM   TSH 1.64 08/15/2019 03:06 PM    CBC Latest Ref Rng & Units 07/10/2020 08/15/2019 07/20/2018  WBC 4.0 - 10.5 K/uL 4.8 4.4 4.2  Hemoglobin 13.0 - 17.0 g/dL 14.2 14.3 14.3  Hematocrit 39.0 - 52.0 % 44.3 44.0 43.1  Platelets 150.0 - 400.0 K/uL 171.0 176.0 159.0    No results found for: VD25OH  Clinical ASCVD: No  The 10-year ASCVD risk score (Arnett DK, et al., 2019) is: 13.1%   Values used to calculate the score:     Age: 13 years     Sex: Male     Is Non-Hispanic African American:  Yes     Diabetic: No     Tobacco smoker: No     Systolic Blood Pressure: 094 mmHg     Is BP treated: Yes     HDL Cholesterol: 30.8 mg/dL     Total Cholesterol: 217 mg/dL    Depression screen PHQ 2/9 03/15/2020  Decreased Interest 0  Down, Depressed, Hopeless 0  PHQ - 2 Score 0  Some encounter information is confidential and restricted. Go to Review Flowsheets activity to see all data.     Social History   Tobacco Use  Smoking Status Former   Packs/day: 0.50   Years: 20.00   Pack years: 10.00   Types: Cigarettes   Quit date: 06/30/2011   Years since quitting: 9.9  Smokeless Tobacco Never   BP Readings from Last 3 Encounters:  04/21/21 132/82  01/29/21 134/85  01/07/21 134/84   Pulse Readings from Last 3 Encounters:  04/21/21 80  01/29/21 64  01/07/21 68   Wt Readings from Last 3 Encounters:  04/21/21 286 lb 3.2 oz (129.8 kg)  01/29/21 293 lb 3.2 oz (133 kg)  01/07/21 290 lb (131.5 kg)   BMI Readings from Last 3 Encounters:  04/21/21 37.76 kg/m  01/29/21 38.68 kg/m  01/07/21 38.26 kg/m    Assessment/Interventions: Review of patient past medical history, allergies, medications, health status, including review of consultants reports, laboratory and other test data, was performed as part of comprehensive evaluation and provision of chronic care management services.   SDOH:  (Social Determinants of Health) assessments and interventions performed: {yes/no:20286}  SDOH Screenings   Alcohol Screen: Not on file  Depression (PHQ2-9): Not on file  Financial Resource Strain: Not on file  Food Insecurity: Not on file  Housing: Not on file  Physical Activity: Not on file  Social Connections: Not on file  Stress: Not on file  Tobacco Use: Medium Risk   Smoking Tobacco Use: Former   Smokeless Tobacco Use: Never   Passive Exposure: Not on file  Transportation Needs: Not on file    Teague  No Known Allergies  Medications Reviewed Today     Reviewed by  Hoyt Koch, MD (Physician) on 04/21/21 at 1508  Med List Status: <None>   Medication Order Taking? Sig Documenting Provider Last Dose Status Informant  azelastine (ASTELIN) 0.1 % nasal spray [id number] Yes Place 2 sprays into both nostrils 2 (two) times daily. Use in each nostril as directed Janith Lima, MD Taking Active   calcium-vitamin D (OSCAL WITH D) 500-200 MG-UNIT tablet [id number] Yes Take 1 tablet by mouth. [provider] Taking Active   citalopram (CELEXA) 40 MG tablet [id number] Yes TAKE 1 TABLET EVERY DAY Janith Lima, MD Taking Active   indapamide (LOZOL) 1.25 MG tablet [id number]  Yes TAKE 1 TABLET EVERY DAY Janith Lima, MD Taking Active   levocetirizine (XYZAL) 5 MG tablet [id number] Yes Take 1 tablet (5 mg total) by mouth every evening. Janith Lima, MD Taking Active   methylphenidate (RITALIN) 10 MG tablet [id number] Yes Take 1.5 tablets (15 mg total) by mouth 2 (two) times daily with breakfast and lunch. At 0700 and 1200 daily Meredith Staggers, MD Taking Active   methylphenidate (RITALIN) 10 MG tablet [id number] Yes Take 1.5 tablets (15 mg total) by mouth 2 (two) times daily with breakfast and lunch. At 0700 and 1200 daily Meredith Staggers, MD Taking Active   propranolol (INDERAL) 10 MG tablet [id number] Yes TAKE 1 TABLET THREE TIMES DAILY Janith Lima, MD Taking Active   rosuvastatin (CRESTOR) 10 MG tablet [id number] Yes TAKE 1 TABLET EVERY DAY Janith Lima, MD Taking Active   triamcinolone cream (KENALOG) 0.1 % [id number] Yes Apply 1 application topically 2 (two) times daily. 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[place]) 06/05/2020   Essential hypertension 08/15/2019    Sinusitis chronic, sphenoidal 07/22/2018   Chronic intractable headache 07/20/2018   Attention deficit disorder 09/22/2017   Other microscopic hematuria 05/27/2016   Renal mass, right 05/25/2016   Hyperlipidemia with target LDL less than 130 04/23/2015   BPH (benign prostatic hyperplasia) 04/23/2015   Allergic rhinitis 03/29/2013   Routine general medical examination at a health care facility 12/21/2012   Eczema 07/26/2012   Spastic hemiplegia affecting dominant side ([place]) 03/02/2012   Depression 12/02/2011   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     Medication Assistance: {MEDASSISTANCEINFO:25044}  Compliance/Adherence/Medication fill history: Care Gaps: ***  Patient's preferred pharmacy is:  [facility], [address] [place] [address] Phone: [phone] Fax: [phone]   Uses pill box? {Yes or If no, why not?:20788} Pt endorses ***% compliance  Care Plan and Follow Up Patient Decision:  {FOLLOWUP:24991}  Plan: {CM FOLLOW UP PLAN:25073}  ***  Current Barriers:  Chronic Disease Management support, education, and care coordination needs related to Hypertension and Hyperlipidemia   Hypertension BP Readings from Last 3 Encounters:  04/21/21 132/82  01/29/21  134/85  01/07/21 134/84  Pharmacist Clinical Goal(s): Patient will work with PharmD and providers to maintain BP goal <130/80 Current regimen:  Indapamide 1.25 mg daily Propranolol 20 mg 3 times daily Interventions: Discussed BP goals and benefits of medications for prevention of heart attack / stroke Patient self care activities, patient will: Check BP as needed, document, and provide at future appointments Ensure daily salt intake < 2300 mg/day  Hyperlipidemia Lab Results  Component Value Date  [place] 162 (H) 07/10/2020  Pharmacist Clinical Goal(s): Patient will work with PharmD and providers to maintain LDL goal < 130 Current regimen:  Rosuvastatin 65m - 1 tablet daily - ldl has not been rechecked since starting  Interventions: Discussed cholestesterol goals and benefits of medications for prevention of heart attack / stroke Patient self care activities, patient will: Continue low cholesterol diet  Medication management Pharmacist Clinical Goal(s): patient will work with PharmD and providers to maintain optimal medication adherence Current pharmacy: [facility] mail order Interventions Comprehensive medication review performed. Continue current medication management strategy Patient self care activities - patient will: Focus on medication adherence by fill date Take medications as prescribed Report any questions or concerns to PharmD and/or provider(s)    Chart prep  = 12 minutes   ***

## 2021-06-23 ENCOUNTER — Other Ambulatory Visit: Payer: Self-pay | Admitting: Internal Medicine

## 2021-06-23 DIAGNOSIS — I10 Essential (primary) hypertension: Secondary | ICD-10-CM

## 2021-06-24 ENCOUNTER — Telehealth: Payer: Self-pay | Admitting: *Deleted

## 2021-06-24 DIAGNOSIS — G44309 Post-traumatic headache, unspecified, not intractable: Secondary | ICD-10-CM

## 2021-06-24 DIAGNOSIS — S069X5S Unspecified intracranial injury with loss of consciousness greater than 24 hours with return to pre-existing conscious level, sequela: Secondary | ICD-10-CM

## 2021-06-24 MED ORDER — METHYLPHENIDATE HCL 10 MG PO TABS: 15.0000 mg | ORAL_TABLET | Freq: Two times a day (BID) | ORAL | 0 refills | Status: DC

## 2021-06-24 MED ORDER — METHYLPHENIDATE HCL 10 MG PO TABS
15.0000 mg | ORAL_TABLET | Freq: Two times a day (BID) | ORAL | 0 refills | Status: DC
Start: 2021-06-24 — End: 2021-09-03

## 2021-06-24 NOTE — Telephone Encounter (Signed)
Mark [name] called for a refill on his methylphenidate. Last fill was 05/15/21.

## 2021-06-24 NOTE — Telephone Encounter (Signed)
Rx's for this month and next written

## 2021-06-25 NOTE — Telephone Encounter (Signed)
Notified. 

## 2021-07-02 ENCOUNTER — Telehealth: Payer: Self-pay | Admitting: *Deleted

## 2021-07-02 MED ORDER — METHYLPHENIDATE HCL 10 MG PO TABS: 10.0000 mg | ORAL_TABLET | Freq: Two times a day (BID) | ORAL | 0 refills | Status: DC

## 2021-07-02 NOTE — Telephone Encounter (Signed)
Mark [name] gets his methylphenidate from [facility] order pharmacy. They notified him it was just mailed and to expect delivery around 10 days. He does not have enough to make it until then. He is asking for a half Rx sent to his local pharmacy to cover him until then.I have attached the rx for [facility]

## 2021-07-02 NOTE — Telephone Encounter (Signed)
PMP was Reviewed.  Methylphenidate e-scribed today.

## 2021-07-03 ENCOUNTER — Telehealth: Payer: Self-pay

## 2021-07-03 NOTE — Telephone Encounter (Signed)
Patient called to request refill on Ritalin. Called and left voicemail for patient to return call to clinic to inform him that medication was sent to pharmacy on 07/02/20

## 2021-07-03 NOTE — Telephone Encounter (Signed)
Notified this was filled by Zella Ball to [facility].

## 2021-07-21 ENCOUNTER — Other Ambulatory Visit: Payer: Self-pay | Admitting: Internal Medicine

## 2021-07-21 DIAGNOSIS — E785 Hyperlipidemia, unspecified: Secondary | ICD-10-CM

## 2021-08-04 ENCOUNTER — Other Ambulatory Visit: Payer: Self-pay | Admitting: Internal Medicine

## 2021-08-04 DIAGNOSIS — I10 Essential (primary) hypertension: Secondary | ICD-10-CM

## 2021-09-03 ENCOUNTER — Other Ambulatory Visit: Payer: Self-pay

## 2021-09-03 ENCOUNTER — Encounter: Payer: Medicare HMO | Attending: Physical Medicine & Rehabilitation | Admitting: Physical Medicine & Rehabilitation

## 2021-09-03 ENCOUNTER — Encounter: Payer: Self-pay | Admitting: Physical Medicine & Rehabilitation

## 2021-09-03 VITALS — BP 118/80 | HR 61 | Ht 73.0 in | Wt 291.0 lb

## 2021-09-03 DIAGNOSIS — G44309 Post-traumatic headache, unspecified, not intractable: Secondary | ICD-10-CM | POA: Diagnosis not present

## 2021-09-03 DIAGNOSIS — S069X5S Unspecified intracranial injury with loss of consciousness greater than 24 hours with return to pre-existing conscious level, sequela: Secondary | ICD-10-CM | POA: Insufficient documentation

## 2021-09-03 DIAGNOSIS — G8110 Spastic hemiplegia affecting unspecified side: Secondary | ICD-10-CM | POA: Diagnosis not present

## 2021-09-03 DIAGNOSIS — R6889 Other general symptoms and signs: Secondary | ICD-10-CM | POA: Diagnosis not present

## 2021-09-03 MED ORDER — METHYLPHENIDATE HCL 10 MG PO TABS: 15.0000 mg | ORAL_TABLET | Freq: Two times a day (BID) | ORAL | 0 refills | Status: DC

## 2021-09-03 NOTE — Patient Instructions (Signed)
EXERCISE. MAKE A SCHEDULE FOR CHORES AND EXERCISE!! ?
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
## 2021-09-23 DIAGNOSIS — H524 Presbyopia: Secondary | ICD-10-CM | POA: Diagnosis not present

## 2021-10-14 DIAGNOSIS — H2511 Age-related nuclear cataract, right eye: Secondary | ICD-10-CM | POA: Diagnosis not present

## 2021-10-15 ENCOUNTER — Other Ambulatory Visit: Payer: Self-pay | Admitting: Internal Medicine

## 2021-10-15 DIAGNOSIS — J301 Allergic rhinitis due to pollen: Secondary | ICD-10-CM

## 2021-10-21 DIAGNOSIS — Z01818 Encounter for other preprocedural examination: Secondary | ICD-10-CM | POA: Diagnosis not present

## 2021-10-21 DIAGNOSIS — H2511 Age-related nuclear cataract, right eye: Secondary | ICD-10-CM | POA: Diagnosis not present

## 2021-10-27 ENCOUNTER — Telehealth: Payer: Self-pay

## 2021-10-27 ENCOUNTER — Other Ambulatory Visit (HOSPITAL_COMMUNITY): Payer: Self-pay

## 2021-10-27 ENCOUNTER — Telehealth: Payer: Self-pay | Admitting: *Deleted

## 2021-10-27 DIAGNOSIS — G44309 Post-traumatic headache, unspecified, not intractable: Secondary | ICD-10-CM

## 2021-10-27 DIAGNOSIS — S069X5S Unspecified intracranial injury with loss of consciousness greater than 24 hours with return to pre-existing conscious level, sequela: Secondary | ICD-10-CM

## 2021-10-27 MED ORDER — METHYLPHENIDATE HCL 10 MG PO TABS
15.0000 mg | ORAL_TABLET | Freq: Two times a day (BID) | ORAL | 0 refills | Status: DC
  Filled 2021-10-27: qty 90, 30d supply, fill #0

## 2021-10-27 NOTE — Telephone Encounter (Signed)
Mark [name] is requesting [name]'s ritalin be set up to be 90 day refill with [facility]. It looks like it has been sent in for this month to [facility] but going forward if he could get 90 day sent to [facility]. ?

## 2021-10-27 NOTE — Telephone Encounter (Signed)
PMP was Reviewed.  ?Dr Naaman Plummer note was reviewed ?Methylphenidate e-scribed to [facility]. Mr. Spickler is aware of the above.  ?

## 2021-10-27 NOTE — Telephone Encounter (Signed)
Dr. Naaman Plummer is out of the office today.  ? ?Methylphenidate 10 mg is out of stock at [facility]. Rx has been cancelled. Will you please send a new script to [facility]? Patient has been informed.  ? ?Thank you.  ?

## 2021-10-30 DIAGNOSIS — H2513 Age-related nuclear cataract, bilateral: Secondary | ICD-10-CM | POA: Diagnosis not present

## 2021-10-30 DIAGNOSIS — H2511 Age-related nuclear cataract, right eye: Secondary | ICD-10-CM | POA: Diagnosis not present

## 2021-11-25 ENCOUNTER — Telehealth: Payer: Self-pay

## 2021-11-25 DIAGNOSIS — G44309 Post-traumatic headache, unspecified, not intractable: Secondary | ICD-10-CM

## 2021-11-25 DIAGNOSIS — S069X5S Unspecified intracranial injury with loss of consciousness greater than 24 hours with return to pre-existing conscious level, sequela: Secondary | ICD-10-CM

## 2021-11-25 MED ORDER — METHYLPHENIDATE HCL 10 MG PO TABS: 15.0000 mg | ORAL_TABLET | Freq: Two times a day (BID) | ORAL | 0 refills | Status: DC

## 2021-11-25 NOTE — Telephone Encounter (Signed)
Patient is calling to request refill on Ritalin. Per PMP, last fill was 10/27/21

## 2021-11-25 NOTE — Addendum Note (Signed)
Addended by: Alger Simons T on: 11/25/2021 05:35 PM   Modules accepted: Orders

## 2021-11-25 NOTE — Telephone Encounter (Signed)
Rx'es for this month and next written

## 2021-12-02 DIAGNOSIS — R6889 Other general symptoms and signs: Secondary | ICD-10-CM | POA: Diagnosis not present

## 2021-12-27 ENCOUNTER — Other Ambulatory Visit: Payer: Self-pay | Admitting: Internal Medicine

## 2021-12-27 DIAGNOSIS — I10 Essential (primary) hypertension: Secondary | ICD-10-CM

## 2021-12-31 ENCOUNTER — Encounter: Payer: Medicare HMO | Admitting: Physical Medicine & Rehabilitation

## 2022-01-28 ENCOUNTER — Other Ambulatory Visit: Payer: Self-pay | Admitting: Family Medicine

## 2022-01-28 ENCOUNTER — Other Ambulatory Visit (HOSPITAL_COMMUNITY): Payer: Self-pay | Admitting: Family Medicine

## 2022-01-28 DIAGNOSIS — R569 Unspecified convulsions: Secondary | ICD-10-CM

## 2022-01-29 ENCOUNTER — Other Ambulatory Visit: Payer: Self-pay | Admitting: Internal Medicine

## 2022-01-29 DIAGNOSIS — F32A Depression, unspecified: Secondary | ICD-10-CM

## 2022-02-03 ENCOUNTER — Telehealth: Payer: Self-pay

## 2022-02-03 DIAGNOSIS — S069X5S Unspecified intracranial injury with loss of consciousness greater than 24 hours with return to pre-existing conscious level, sequela: Secondary | ICD-10-CM

## 2022-02-03 DIAGNOSIS — G44309 Post-traumatic headache, unspecified, not intractable: Secondary | ICD-10-CM

## 2022-02-03 MED ORDER — METHYLPHENIDATE HCL 10 MG PO TABS: 15.0000 mg | ORAL_TABLET | Freq: Two times a day (BID) | ORAL | 0 refills | Status: DC

## 2022-02-03 NOTE — Telephone Encounter (Signed)
Rx written and sent to the pharmacy. Thanks!  

## 2022-02-03 NOTE — Telephone Encounter (Signed)
Patient is calling to get a refill on Ritalin. Per PMP, last fill was 12/29/21

## 2022-02-03 NOTE — Addendum Note (Signed)
Addended by: Alger Simons T on: 02/03/2022 01:59 PM   Modules accepted: Orders

## 2022-02-10 ENCOUNTER — Ambulatory Visit (HOSPITAL_COMMUNITY): Payer: Medicare HMO

## 2022-02-17 ENCOUNTER — Ambulatory Visit (HOSPITAL_COMMUNITY)
Admission: RE | Admit: 2022-02-17 | Discharge: 2022-02-17 | Disposition: A | Discharge status: Home / Self Care | Payer: Medicare HMO | Source: Ambulatory Visit | Attending: Family Medicine | Admitting: Family Medicine

## 2022-02-17 DIAGNOSIS — R569 Unspecified convulsions: Secondary | ICD-10-CM | POA: Insufficient documentation

## 2022-03-18 ENCOUNTER — Encounter: Payer: Self-pay | Admitting: Physical Medicine & Rehabilitation

## 2022-03-18 ENCOUNTER — Encounter: Payer: Medicare HMO | Attending: Physical Medicine & Rehabilitation | Admitting: Physical Medicine & Rehabilitation

## 2022-03-18 VITALS — BP 125/84 | HR 64 | Ht 73.0 in | Wt 287.8 lb

## 2022-03-18 DIAGNOSIS — G44309 Post-traumatic headache, unspecified, not intractable: Secondary | ICD-10-CM | POA: Diagnosis not present

## 2022-03-18 DIAGNOSIS — F068 Other specified mental disorders due to known physiological condition: Secondary | ICD-10-CM | POA: Insufficient documentation

## 2022-03-18 DIAGNOSIS — S069XAD Unspecified intracranial injury with loss of consciousness status unknown, subsequent encounter: Secondary | ICD-10-CM

## 2022-03-18 DIAGNOSIS — F329 Major depressive disorder, single episode, unspecified: Secondary | ICD-10-CM | POA: Diagnosis not present

## 2022-03-18 DIAGNOSIS — S069X0S Unspecified intracranial injury without loss of consciousness, sequela: Secondary | ICD-10-CM | POA: Insufficient documentation

## 2022-03-18 DIAGNOSIS — S069X5S Unspecified intracranial injury with loss of consciousness greater than 24 hours with return to pre-existing conscious level, sequela: Secondary | ICD-10-CM | POA: Diagnosis present

## 2022-03-18 MED ORDER — METHYLPHENIDATE HCL 10 MG PO TABS: 15.0000 mg | ORAL_TABLET | Freq: Two times a day (BID) | ORAL | 0 refills | Status: DC

## 2022-03-18 NOTE — Patient Instructions (Signed)
PLEASE FEEL FREE TO CALL OUR OFFICE WITH ANY PROBLEMS OR QUESTIONS ([phone])      
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
## 2022-04-11 ENCOUNTER — Other Ambulatory Visit: Payer: Self-pay | Admitting: Internal Medicine

## 2022-04-11 DIAGNOSIS — I10 Essential (primary) hypertension: Secondary | ICD-10-CM

## 2022-04-11 DIAGNOSIS — E785 Hyperlipidemia, unspecified: Secondary | ICD-10-CM

## 2022-05-20 ENCOUNTER — Telehealth: Payer: Self-pay

## 2022-05-20 DIAGNOSIS — G44309 Post-traumatic headache, unspecified, not intractable: Secondary | ICD-10-CM

## 2022-05-20 DIAGNOSIS — S069X5S Unspecified intracranial injury with loss of consciousness greater than 24 hours with return to pre-existing conscious level, sequela: Secondary | ICD-10-CM

## 2022-05-20 MED ORDER — METHYLPHENIDATE HCL 10 MG PO TABS: 15.0000 mg | ORAL_TABLET | Freq: Two times a day (BID) | ORAL | 0 refills | Status: DC

## 2022-05-20 NOTE — Telephone Encounter (Signed)
Rx written and sent to the pharmacy. Thanks!  

## 2022-05-21 ENCOUNTER — Other Ambulatory Visit: Payer: Self-pay | Admitting: Internal Medicine

## 2022-05-21 DIAGNOSIS — J301 Allergic rhinitis due to pollen: Secondary | ICD-10-CM

## 2022-06-23 ENCOUNTER — Telehealth: Payer: Self-pay | Admitting: Physical Medicine & Rehabilitation

## 2022-06-23 NOTE — Telephone Encounter (Signed)
Patient would like a refill of Methylphenidate

## 2022-06-24 ENCOUNTER — Telehealth: Payer: Self-pay

## 2022-06-24 DIAGNOSIS — S069X5S Unspecified intracranial injury with loss of consciousness greater than 24 hours with return to pre-existing conscious level, sequela: Secondary | ICD-10-CM

## 2022-06-24 DIAGNOSIS — G44309 Post-traumatic headache, unspecified, not intractable: Secondary | ICD-10-CM

## 2022-06-24 MED ORDER — METHYLPHENIDATE HCL 10 MG PO TABS: 15.0000 mg | ORAL_TABLET | Freq: Two times a day (BID) | ORAL | 0 refills | Status: DC

## 2022-06-24 NOTE — Telephone Encounter (Signed)
Mark [name] is out of the Methylphenidate 10 MG. Please send a refill to [facility].  (Dr. Cathleen Corti is out of the office).  Thank you.

## 2022-06-24 NOTE — Telephone Encounter (Signed)
PMP was Reviewed. Dr Naaman Plummer note was reviewed,  Methylphenidate e-scribed to pharmacy.  Mark [name] was called regarding the above and he verbalizes understanding.

## 2022-06-25 ENCOUNTER — Telehealth: Payer: Self-pay

## 2022-06-25 NOTE — Telephone Encounter (Signed)
Patient called in requesting refill on propanolol advised patient to contact pcp for refill

## 2022-06-27 ENCOUNTER — Other Ambulatory Visit: Payer: Self-pay | Admitting: Internal Medicine

## 2022-06-27 DIAGNOSIS — I10 Essential (primary) hypertension: Secondary | ICD-10-CM

## 2022-06-30 MED ORDER — METHYLPHENIDATE HCL 10 MG PO TABS: 15.0000 mg | ORAL_TABLET | Freq: Two times a day (BID) | ORAL | 0 refills | Status: DC

## 2022-06-30 NOTE — Addendum Note (Signed)
Addended by: Alger Simons T on: 06/30/2022 12:03 PM   Modules accepted: Orders

## 2022-06-30 NOTE — Telephone Encounter (Signed)
A second rx written for 07/23/22 (I saw one was written for last mo)

## 2022-07-08 ENCOUNTER — Telehealth: Payer: Self-pay | Admitting: Family Medicine

## 2022-07-08 NOTE — Telephone Encounter (Signed)
Patient called and stated he needs a refill on his propanolol '10mg'$  and it should be filled through [facility].

## 2022-08-12 ENCOUNTER — Encounter: Payer: Medicare HMO | Admitting: Internal Medicine

## 2022-08-13 ENCOUNTER — Ambulatory Visit: Payer: Medicare HMO | Admitting: Internal Medicine

## 2022-08-13 ENCOUNTER — Ambulatory Visit: Payer: Medicare HMO | Admitting: Family Medicine

## 2022-08-21 ENCOUNTER — Encounter: Payer: Medicare HMO | Admitting: Internal Medicine

## 2022-08-25 ENCOUNTER — Telehealth: Payer: Self-pay | Admitting: *Deleted

## 2022-08-25 ENCOUNTER — Other Ambulatory Visit: Payer: Self-pay | Admitting: Internal Medicine

## 2022-08-25 DIAGNOSIS — S069X5S Unspecified intracranial injury with loss of consciousness greater than 24 hours with return to pre-existing conscious level, sequela: Secondary | ICD-10-CM

## 2022-08-25 DIAGNOSIS — G44309 Post-traumatic headache, unspecified, not intractable: Secondary | ICD-10-CM

## 2022-08-25 DIAGNOSIS — F32A Depression, unspecified: Secondary | ICD-10-CM

## 2022-08-25 MED ORDER — METHYLPHENIDATE HCL 10 MG PO TABS: 15.0000 mg | ORAL_TABLET | Freq: Two times a day (BID) | ORAL | 0 refills | Status: DC

## 2022-08-25 NOTE — Telephone Encounter (Signed)
PMP was Reviewed.  Dr Naaman Plummer note was reviewed.  Ritalin e-scribed today, call placed to Mr. Cosper regarding the above, she verbalizes understanding.

## 2022-08-25 NOTE — Telephone Encounter (Signed)
Mr Mark [name] called for a refill on his Methylphenidate be sent to [facility].

## 2022-09-16 ENCOUNTER — Encounter: Payer: Medicare HMO | Attending: Physical Medicine & Rehabilitation | Admitting: Physical Medicine & Rehabilitation

## 2022-09-16 ENCOUNTER — Encounter: Payer: Self-pay | Admitting: Physical Medicine & Rehabilitation

## 2022-09-16 VITALS — BP 117/81 | HR 67 | Ht 73.0 in | Wt 281.0 lb

## 2022-09-16 DIAGNOSIS — F068 Other specified mental disorders due to known physiological condition: Secondary | ICD-10-CM | POA: Diagnosis present

## 2022-09-16 DIAGNOSIS — G894 Chronic pain syndrome: Secondary | ICD-10-CM | POA: Diagnosis not present

## 2022-09-16 DIAGNOSIS — G44309 Post-traumatic headache, unspecified, not intractable: Secondary | ICD-10-CM | POA: Diagnosis present

## 2022-09-16 DIAGNOSIS — S069X5S Unspecified intracranial injury with loss of consciousness greater than 24 hours with return to pre-existing conscious level, sequela: Secondary | ICD-10-CM | POA: Insufficient documentation

## 2022-09-16 DIAGNOSIS — Z79891 Long term (current) use of opiate analgesic: Secondary | ICD-10-CM | POA: Insufficient documentation

## 2022-09-16 DIAGNOSIS — Z5181 Encounter for therapeutic drug level monitoring: Secondary | ICD-10-CM | POA: Insufficient documentation

## 2022-09-16 DIAGNOSIS — S069X0S Unspecified intracranial injury without loss of consciousness, sequela: Secondary | ICD-10-CM | POA: Insufficient documentation

## 2022-09-16 MED ORDER — METHYLPHENIDATE HCL 10 MG PO TABS: 15.0000 mg | ORAL_TABLET | Freq: Two times a day (BID) | ORAL | 0 refills | Status: DC

## 2022-09-16 NOTE — Patient Instructions (Signed)
ALWAYS FEEL FREE TO CALL OUR OFFICE WITH ANY PROBLEMS OR QUESTIONS ([phone])  **PLEASE NOTE** ALL MEDICATION REFILL REQUESTS (INCLUDING CONTROLLED SUBSTANCES) NEED TO BE MADE AT LEAST 7 DAYS PRIOR TO REFILL BEING DUE. ANY REFILL REQUESTS INSIDE THAT TIME FRAME MAY RESULT IN DELAYS IN RECEIVING YOUR PRESCRIPTION.                    
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
## 2022-09-17 ENCOUNTER — Telehealth: Payer: Self-pay | Admitting: *Deleted

## 2022-09-17 NOTE — Telephone Encounter (Signed)
Patient called stating there was an issue with Ritalin prescriptions.   I called [facility] for more information. They did not read the detail one script is for now and the 2nd script is to be put on hold for next month.  Patient is out of medication and would like the 1st script sent [facility] [address]. He will  then resume filling with [facility].

## 2022-09-17 NOTE — Telephone Encounter (Addendum)
Zella Ball can you send in 10 days of his Ritalin (#30 tablets)  to local [facility] while the Mail Order pharmacy gets Rx sent in by Dr Naaman Plummer registered and will not be received by pt for 7-10 days.  Dr Naaman Plummer has not been able to send this in for him.  Please write in note to pharmacy 10 day supply until mail order rx can be received (7-10 days)

## 2022-09-18 MED ORDER — METHYLPHENIDATE HCL 10 MG PO TABS: 15.0000 mg | ORAL_TABLET | Freq: Two times a day (BID) | ORAL | 0 refills | Status: DC

## 2022-09-18 NOTE — Telephone Encounter (Signed)
10 day rx sent to [facility]

## 2022-09-19 LAB — DRUG TOX MONITOR 1 W/CONF, ORAL FLD

## 2022-09-19 LAB — DRUG TOX METHYLPHEN W/CONF,ORAL FLD
Methylphenidate: 116.6 ng/mL — ABNORMAL HIGH (ref <1.0)
Methylphenidate: POSITIVE ng/mL — AB (ref <1.0)

## 2022-09-19 LAB — DRUG TOX ALC METAB W/CON, ORAL FLD: Alcohol Metabolite: NEGATIVE ng/mL (ref <25)

## 2022-09-29 ENCOUNTER — Telehealth: Payer: Self-pay | Admitting: *Deleted

## 2022-09-29 NOTE — Telephone Encounter (Signed)
Oral swab drug screen was consistent for prescribed medications.  ?

## 2022-10-05 ENCOUNTER — Encounter: Payer: Self-pay | Admitting: Gastroenterology

## 2022-10-08 ENCOUNTER — Ambulatory Visit: Payer: Medicare HMO | Admitting: Family Medicine

## 2022-11-19 ENCOUNTER — Telehealth: Payer: Self-pay | Admitting: *Deleted

## 2022-11-19 DIAGNOSIS — G44309 Post-traumatic headache, unspecified, not intractable: Secondary | ICD-10-CM

## 2022-11-19 DIAGNOSIS — S069X5S Unspecified intracranial injury with loss of consciousness greater than 24 hours with return to pre-existing conscious level, sequela: Secondary | ICD-10-CM

## 2022-11-19 NOTE — Telephone Encounter (Signed)
Mark [name] is needing a refill on his methylphenidate.

## 2022-11-20 MED ORDER — METHYLPHENIDATE HCL 10 MG PO TABS: 15.0000 mg | ORAL_TABLET | Freq: Two times a day (BID) | ORAL | 0 refills | Status: DC

## 2022-11-20 NOTE — Telephone Encounter (Signed)
Rx's sent to [facility] (mail order)

## 2023-01-15 ENCOUNTER — Encounter: Payer: Medicare HMO | Admitting: Registered Nurse

## 2023-01-26 ENCOUNTER — Encounter: Payer: Self-pay | Admitting: Registered Nurse

## 2023-01-26 ENCOUNTER — Encounter: Payer: Medicare HMO | Attending: Registered Nurse | Admitting: Registered Nurse

## 2023-01-26 VITALS — BP 119/80 | HR 69 | Ht 73.0 in | Wt 278.0 lb

## 2023-01-26 DIAGNOSIS — F068 Other specified mental disorders due to known physiological condition: Secondary | ICD-10-CM | POA: Diagnosis present

## 2023-01-26 DIAGNOSIS — G894 Chronic pain syndrome: Secondary | ICD-10-CM | POA: Diagnosis not present

## 2023-01-26 DIAGNOSIS — Z79891 Long term (current) use of opiate analgesic: Secondary | ICD-10-CM | POA: Diagnosis not present

## 2023-01-26 DIAGNOSIS — G44309 Post-traumatic headache, unspecified, not intractable: Secondary | ICD-10-CM | POA: Insufficient documentation

## 2023-01-26 DIAGNOSIS — S069X0S Unspecified intracranial injury without loss of consciousness, sequela: Secondary | ICD-10-CM | POA: Insufficient documentation

## 2023-01-26 DIAGNOSIS — S069X5S Unspecified intracranial injury with loss of consciousness greater than 24 hours with return to pre-existing conscious level, sequela: Secondary | ICD-10-CM | POA: Diagnosis present

## 2023-01-26 MED ORDER — METHYLPHENIDATE HCL 10 MG PO TABS: 15.0000 mg | ORAL_TABLET | Freq: Two times a day (BID) | ORAL | 0 refills | Status: DC

## 2023-01-26 NOTE — Patient Instructions (Addendum)
I sent July Prescription To [facility]  I will Send August Prescription to [facility].   Call Office on 03/19/2023: call and ask for your prescriptions to be sent to the pharmacy.   Your next appointment will be in 4 months
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
## 2023-03-29 ENCOUNTER — Telehealth: Payer: Self-pay | Admitting: Specialist

## 2023-03-29 DIAGNOSIS — S069X5S Unspecified intracranial injury with loss of consciousness greater than 24 hours with return to pre-existing conscious level, sequela: Secondary | ICD-10-CM

## 2023-03-29 DIAGNOSIS — G44309 Post-traumatic headache, unspecified, not intractable: Secondary | ICD-10-CM

## 2023-03-29 NOTE — Telephone Encounter (Signed)
Patient is a patient of Dr. Riley Kill and Jacalyn Lefevre.   At his last visit he was supposed to call and get a refill for his Ritalin, he did not do so and is calling to request the refill now. Contact number is [phone].

## 2023-03-30 MED ORDER — METHYLPHENIDATE HCL 10 MG PO TABS
15.0000 mg | ORAL_TABLET | Freq: Two times a day (BID) | ORAL | 0 refills | Status: DC
Start: 2023-03-30 — End: 2023-05-18

## 2023-03-30 MED ORDER — METHYLPHENIDATE HCL 10 MG PO TABS: 15.0000 mg | ORAL_TABLET | Freq: Two times a day (BID) | ORAL | 0 refills | Status: DC

## 2023-03-30 NOTE — Telephone Encounter (Signed)
Rx'es sent for October and November

## 2023-05-17 ENCOUNTER — Telehealth: Payer: Self-pay | Admitting: *Deleted

## 2023-05-17 DIAGNOSIS — G44309 Post-traumatic headache, unspecified, not intractable: Secondary | ICD-10-CM

## 2023-05-17 DIAGNOSIS — S069X5S Unspecified intracranial injury with loss of consciousness greater than 24 hours with return to pre-existing conscious level, sequela: Secondary | ICD-10-CM

## 2023-05-17 NOTE — Telephone Encounter (Signed)
Mark [name] called to get refills on his methylphenidate. Has Appt 11/20 but will be out before then.

## 2023-05-18 MED ORDER — METHYLPHENIDATE HCL 10 MG PO TABS
15.0000 mg | ORAL_TABLET | Freq: Two times a day (BID) | ORAL | 0 refills | Status: DC
Start: 2023-05-18 — End: 2023-08-10

## 2023-05-18 NOTE — Telephone Encounter (Signed)
He had an rx with a DNF before of 11/1 on file. I went ahead and refilled today with rx for next month as well. Make sure he remembers I'm filling 2 at a time!  thx

## 2023-05-19 ENCOUNTER — Encounter: Payer: Self-pay | Admitting: Physical Medicine & Rehabilitation

## 2023-05-19 ENCOUNTER — Encounter: Payer: Medicare HMO | Attending: Physical Medicine & Rehabilitation | Admitting: Physical Medicine & Rehabilitation

## 2023-05-19 VITALS — BP 139/86 | HR 73 | Ht 73.0 in | Wt 276.0 lb

## 2023-05-19 DIAGNOSIS — G44309 Post-traumatic headache, unspecified, not intractable: Secondary | ICD-10-CM | POA: Diagnosis present

## 2023-05-19 MED ORDER — SUMATRIPTAN SUCCINATE 25 MG PO TABS
25.0000 mg | ORAL_TABLET | Freq: Every day | ORAL | 4 refills | Status: DC | PRN
Start: 2023-05-19 — End: 2023-09-22
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
## 2023-05-19 NOTE — Patient Instructions (Signed)
ALWAYS FEEL FREE TO CALL OUR OFFICE WITH ANY PROBLEMS OR QUESTIONS [phone])  **PLEASE NOTE** ALL MEDICATION REFILL REQUESTS (INCLUDING CONTROLLED SUBSTANCES) NEED TO BE MADE AT LEAST 7 DAYS PRIOR TO REFILL BEING DUE. ANY REFILL REQUESTS INSIDE THAT TIME FRAME MAY RESULT IN DELAYS IN RECEIVING YOUR PRESCRIPTION.                    TRY LISTENING TO YOUR HEADPHONES TO MUSIC OR RELAXING SOUNDS WHEN YOUR STRESS LEVEL BUILDS.

## 2023-08-09 ENCOUNTER — Telehealth: Payer: Self-pay | Admitting: Physical Medicine & Rehabilitation

## 2023-08-09 DIAGNOSIS — G44309 Post-traumatic headache, unspecified, not intractable: Secondary | ICD-10-CM

## 2023-08-09 DIAGNOSIS — S069X5S Unspecified intracranial injury with loss of consciousness greater than 24 hours with return to pre-existing conscious level, sequela: Secondary | ICD-10-CM

## 2023-08-09 NOTE — Telephone Encounter (Signed)
 Requesting Ritalin  refill please send to [facility].

## 2023-08-10 MED ORDER — METHYLPHENIDATE HCL 10 MG PO TABS
15.0000 mg | ORAL_TABLET | Freq: Two times a day (BID) | ORAL | 0 refills | Status: DC
Start: 2023-08-10 — End: 2023-08-24

## 2023-08-10 NOTE — Telephone Encounter (Signed)
Rx written and sent to the pharmacy. Thanks! 2 months worth

## 2023-08-18 ENCOUNTER — Encounter: Payer: No Typology Code available for payment source | Admitting: Physical Medicine & Rehabilitation

## 2023-08-24 ENCOUNTER — Other Ambulatory Visit: Payer: Self-pay

## 2023-08-24 DIAGNOSIS — S069X5S Unspecified intracranial injury with loss of consciousness greater than 24 hours with return to pre-existing conscious level, sequela: Secondary | ICD-10-CM

## 2023-08-24 DIAGNOSIS — G44309 Post-traumatic headache, unspecified, not intractable: Secondary | ICD-10-CM

## 2023-08-24 NOTE — Telephone Encounter (Signed)
 Patient needs prescription to go to [facility] on [address]. He changed insurance and they do not do mail order order for this medication.

## 2023-08-25 MED ORDER — METHYLPHENIDATE HCL 10 MG PO TABS
15.0000 mg | ORAL_TABLET | Freq: Two times a day (BID) | ORAL | 0 refills | Status: DC
Start: 2023-08-25 — End: 2023-11-30

## 2023-08-30 ENCOUNTER — Telehealth: Payer: Self-pay | Admitting: Physical Medicine & Rehabilitation

## 2023-08-30 NOTE — Telephone Encounter (Signed)
 Patient called he needs his prescriptions to go to [facility] fax [phone] Ritalin 10 MG and Imitrex 25 MG. He stated he has not had meds for a few weeks.

## 2023-08-31 ENCOUNTER — Telehealth: Payer: Self-pay | Admitting: Physical Medicine & Rehabilitation

## 2023-08-31 NOTE — Telephone Encounter (Signed)
 Patient wants to make sure his refills were sent to [facility] on [address], Ritalin and Imitrex.

## 2023-09-06 NOTE — Telephone Encounter (Signed)
 Yes, both rx'es were sent to [facility], [address]

## 2023-09-09 ENCOUNTER — Telehealth: Payer: Self-pay | Admitting: Physical Medicine & Rehabilitation

## 2023-09-09 NOTE — Telephone Encounter (Signed)
 He has rx at pharmacy and pt notified.

## 2023-09-09 NOTE — Telephone Encounter (Signed)
 Patient called and stated our office needs to call his insurance company about his Ritalin [phone].

## 2023-09-22 ENCOUNTER — Encounter
Payer: No Typology Code available for payment source | Attending: Physical Medicine & Rehabilitation | Admitting: Physical Medicine & Rehabilitation

## 2023-09-22 ENCOUNTER — Encounter: Payer: Self-pay | Admitting: Physical Medicine & Rehabilitation

## 2023-09-22 VITALS — BP 110/76 | HR 57 | Ht 73.0 in | Wt 277.0 lb

## 2023-09-22 DIAGNOSIS — G44309 Post-traumatic headache, unspecified, not intractable: Secondary | ICD-10-CM | POA: Diagnosis not present

## 2023-09-22 DIAGNOSIS — S069X5S Unspecified intracranial injury with loss of consciousness greater than 24 hours with return to pre-existing conscious level, sequela: Secondary | ICD-10-CM | POA: Insufficient documentation

## 2023-09-22 DIAGNOSIS — S069XAS Unspecified intracranial injury with loss of consciousness status unknown, sequela: Secondary | ICD-10-CM | POA: Insufficient documentation

## 2023-09-22 DIAGNOSIS — R4189 Other symptoms and signs involving cognitive functions and awareness: Secondary | ICD-10-CM | POA: Insufficient documentation

## 2023-09-22 DIAGNOSIS — G44321 Chronic post-traumatic headache, intractable: Secondary | ICD-10-CM | POA: Diagnosis present

## 2023-09-22 MED ORDER — SUMATRIPTAN SUCCINATE 25 MG PO TABS: 25.0000 mg | ORAL_TABLET | ORAL | 3 refills | Status: AC | PRN

## 2023-09-22 MED ORDER — AMPHETAMINE-DEXTROAMPHETAMINE 15 MG PO TABS: 15.0000 mg | ORAL_TABLET | Freq: Two times a day (BID) | ORAL | 0 refills | Status: DC

## 2023-09-22 NOTE — Patient Instructions (Addendum)
 ALWAYS FEEL FREE TO CALL OUR OFFICE WITH ANY PROBLEMS OR QUESTIONS [phone])  **PLEASE NOTE** ALL MEDICATION REFILL REQUESTS (INCLUDING CONTROLLED SUBSTANCES) NEED TO BE MADE AT LEAST 7 DAYS PRIOR TO REFILL BEING DUE. ANY REFILL REQUESTS INSIDE THAT TIME FRAME MAY RESULT IN DELAYS IN RECEIVING YOUR PRESCRIPTION.     IF YOU HAVE ANY ISSUES WITH ADDERALL: SUCH AS ANXIOUS, HEART RACING, PROBLEMS SLEEPING, IRRITABILITY, PLEASE LET ME KNOW
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
## 2023-10-27 ENCOUNTER — Other Ambulatory Visit: Payer: Self-pay

## 2023-10-27 DIAGNOSIS — R4189 Other symptoms and signs involving cognitive functions and awareness: Secondary | ICD-10-CM

## 2023-10-27 MED ORDER — AMPHETAMINE-DEXTROAMPHETAMINE 15 MG PO TABS: 15.0000 mg | ORAL_TABLET | Freq: Two times a day (BID) | ORAL | 0 refills | Status: DC

## 2023-10-28 ENCOUNTER — Telehealth: Payer: Self-pay

## 2023-10-28 NOTE — Telephone Encounter (Signed)
 PA for Adderall 15 MG  approved.

## 2023-10-28 NOTE — Telephone Encounter (Signed)
 PA for Adderall 15 MG  denied. An appeal has been completed.   NOW it is approved. Waiting on faxed confirmation from phone [phone]. Patient informed.

## 2023-11-10 ENCOUNTER — Telehealth: Payer: Self-pay

## 2023-11-10 NOTE — Telephone Encounter (Signed)
 Pt. Stated that he needs dr. Rachel Budds information to be sent to disability transportation to the city of [place]. Fax number [phone]. He want to be contacted at [phone].

## 2023-11-11 ENCOUNTER — Telehealth: Payer: Self-pay

## 2023-11-11 NOTE — Telephone Encounter (Signed)
 Pt. Stated that he need dr. Rachel Budds to fill out part B on the ride service form for disability transportation. [place] [address]. Pt. Would like to be contacted once completed.

## 2023-11-19 ENCOUNTER — Other Ambulatory Visit: Payer: Self-pay | Admitting: Family Medicine

## 2023-11-19 DIAGNOSIS — Z122 Encounter for screening for malignant neoplasm of respiratory organs: Secondary | ICD-10-CM

## 2023-11-30 ENCOUNTER — Telehealth: Payer: Self-pay | Admitting: *Deleted

## 2023-11-30 ENCOUNTER — Other Ambulatory Visit (HOSPITAL_COMMUNITY): Payer: Self-pay

## 2023-11-30 DIAGNOSIS — G44309 Post-traumatic headache, unspecified, not intractable: Secondary | ICD-10-CM

## 2023-11-30 DIAGNOSIS — S069X5S Unspecified intracranial injury with loss of consciousness greater than 24 hours with return to pre-existing conscious level, sequela: Secondary | ICD-10-CM

## 2023-11-30 MED ORDER — METHYLPHENIDATE HCL 10 MG PO TABS
15.0000 mg | ORAL_TABLET | Freq: Two times a day (BID) | ORAL | 0 refills | Status: DC
  Filled 2023-11-30: qty 90, 30d supply, fill #0

## 2023-11-30 MED ORDER — METHYLPHENIDATE HCL 10 MG PO TABS
15.0000 mg | ORAL_TABLET | Freq: Two times a day (BID) | ORAL | 0 refills | Status: DC
Start: 2023-11-30 — End: 2023-12-01
  Filled 2023-11-30 – 2023-12-01 (×2): qty 90, 30d supply, fill #0

## 2023-11-30 NOTE — Telephone Encounter (Signed)
 Mark [name] is calling for a refill on his "adderall".

## 2023-11-30 NOTE — Telephone Encounter (Signed)
 Rx written and sent to the pharmacy. Thanks!

## 2023-12-01 ENCOUNTER — Telehealth: Payer: Self-pay

## 2023-12-01 ENCOUNTER — Other Ambulatory Visit (HOSPITAL_COMMUNITY): Payer: Self-pay

## 2023-12-01 ENCOUNTER — Other Ambulatory Visit: Payer: Self-pay

## 2023-12-01 DIAGNOSIS — R4189 Other symptoms and signs involving cognitive functions and awareness: Secondary | ICD-10-CM

## 2023-12-01 MED ORDER — AMPHETAMINE-DEXTROAMPHETAMINE 15 MG PO TABS
15.0000 mg | ORAL_TABLET | Freq: Two times a day (BID) | ORAL | 0 refills | Status: DC
  Filled 2023-12-01: qty 60, 30d supply, fill #0

## 2023-12-01 NOTE — Telephone Encounter (Signed)
 error

## 2023-12-01 NOTE — Telephone Encounter (Signed)
  Approved today by [facility] NCPDP 2017 Your request has been approved Effective Date: 09/02/2023 Authorization Expiration Date: 11/30/2024 Pharmacy informed. Rx can be picked up today.

## 2023-12-01 NOTE — Telephone Encounter (Signed)
 Patient informed. Rx should be available for pick up tomorrow.

## 2023-12-01 NOTE — Addendum Note (Signed)
 Addended by: Abelino Able T on: 12/01/2023 12:48 PM   Modules accepted: Orders

## 2023-12-01 NOTE — Telephone Encounter (Signed)
 Please advise. Should Mark [name] be taking Methylphenidate   (Ritalin ) 10 MG or Amphetamine -Dextroamphetamine  (Adderall) 15 MG?  He stated the Adderall  is working well.

## 2023-12-01 NOTE — Telephone Encounter (Signed)
 We had switched him to adderall at last visit. The ritalin  rx/s were still on there and hi refilled those mistakenly. thx

## 2023-12-01 NOTE — Telephone Encounter (Signed)
 PA FOR RITALIN  10 MG SENT TO COVER MY MEDS- [facility]

## 2023-12-02 ENCOUNTER — Other Ambulatory Visit (HOSPITAL_COMMUNITY): Payer: Self-pay

## 2024-01-19 ENCOUNTER — Encounter: Payer: Self-pay | Attending: Physical Medicine & Rehabilitation | Admitting: Physical Medicine & Rehabilitation

## 2024-01-19 ENCOUNTER — Encounter: Payer: Self-pay | Admitting: Physical Medicine & Rehabilitation

## 2024-01-19 VITALS — BP 131/83 | HR 77 | Ht 73.0 in | Wt 280.0 lb

## 2024-01-19 DIAGNOSIS — S069XAS Unspecified intracranial injury with loss of consciousness status unknown, sequela: Secondary | ICD-10-CM | POA: Insufficient documentation

## 2024-01-19 DIAGNOSIS — R4189 Other symptoms and signs involving cognitive functions and awareness: Secondary | ICD-10-CM | POA: Diagnosis not present

## 2024-01-19 DIAGNOSIS — S069X5S Unspecified intracranial injury with loss of consciousness greater than 24 hours with return to pre-existing conscious level, sequela: Secondary | ICD-10-CM | POA: Diagnosis present

## 2024-01-19 MED ORDER — AMPHETAMINE-DEXTROAMPHETAMINE 15 MG PO TABS
15.0000 mg | ORAL_TABLET | Freq: Two times a day (BID) | ORAL | 0 refills | Status: DC
Start: 2024-01-19 — End: 2024-01-25

## 2024-01-19 MED ORDER — AMPHETAMINE-DEXTROAMPHETAMINE 15 MG PO TABS
15.0000 mg | ORAL_TABLET | Freq: Two times a day (BID) | ORAL | 0 refills | Status: DC
Start: 2024-01-19 — End: 2024-04-04
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
## 2024-01-19 NOTE — Patient Instructions (Signed)
 ALWAYS FEEL FREE TO CALL OUR OFFICE WITH ANY PROBLEMS OR QUESTIONS [phone])  **PLEASE NOTE** ALL MEDICATION REFILL REQUESTS (INCLUDING CONTROLLED SUBSTANCES) NEED TO BE MADE AT LEAST 7 DAYS PRIOR TO REFILL BEING DUE. ANY REFILL REQUESTS INSIDE THAT TIME FRAME MAY RESULT IN DELAYS IN RECEIVING YOUR PRESCRIPTION.

## 2024-01-25 ENCOUNTER — Telehealth: Payer: Self-pay

## 2024-01-25 DIAGNOSIS — R4189 Other symptoms and signs involving cognitive functions and awareness: Secondary | ICD-10-CM

## 2024-01-25 MED ORDER — AMPHETAMINE-DEXTROAMPHETAMINE 15 MG PO TABS
15.0000 mg | ORAL_TABLET | Freq: Two times a day (BID) | ORAL | 0 refills | Status: DC
Start: 2024-01-25 — End: 2024-04-04

## 2024-01-25 NOTE — Telephone Encounter (Signed)
 amphetamine -dextroamphetamine  (ADDERALL) 15 MG tablet Take 1 tablet by mouth 2 (two) times daily at breakfast and lunch. Dispense: 60 tablet, Refills: 0 ordered   01/19/2024 -- Babs Arthea DASEN, MD            Summary: Take 1 tablet by mouth 2 (two) times daily at breakfast and lunch., Starting Wed 01/19/2024, Normal Dose, Route, Frequency: 15 mg, Oral, 2 times dailyDx Associated: Different Encounter: Start Date & Time: 07/23/2025Ord/Sold: 01/19/2024 (O)Ordered On: 07/23/2025ReportPharmacy: [id number] - [place], [place] - [address] [facility] [address]       Ordering Department: [facility]

## 2024-04-04 ENCOUNTER — Telehealth: Payer: Self-pay

## 2024-04-04 DIAGNOSIS — S069XAS Unspecified intracranial injury with loss of consciousness status unknown, sequela: Secondary | ICD-10-CM

## 2024-04-04 MED ORDER — AMPHETAMINE-DEXTROAMPHETAMINE 15 MG PO TABS: 15.0000 mg | ORAL_TABLET | Freq: Two times a day (BID) | ORAL | 0 refills | Status: DC

## 2024-04-04 NOTE — Telephone Encounter (Signed)
 Let Wynona Diedra) know that I filled rx'es for this month and next. thx

## 2024-04-04 NOTE — Telephone Encounter (Signed)
 Patient called for his Adderall 15 MG refill. Please send to [facility] on [address].   Thank you.

## 2024-05-17 ENCOUNTER — Encounter: Payer: Self-pay | Admitting: Physical Medicine & Rehabilitation

## 2024-05-17 ENCOUNTER — Encounter: Attending: Physical Medicine & Rehabilitation | Admitting: Physical Medicine & Rehabilitation

## 2024-05-17 VITALS — BP 120/79 | HR 61 | Ht 73.0 in | Wt 267.0 lb

## 2024-05-17 DIAGNOSIS — F329 Major depressive disorder, single episode, unspecified: Secondary | ICD-10-CM

## 2024-05-17 DIAGNOSIS — G44309 Post-traumatic headache, unspecified, not intractable: Secondary | ICD-10-CM

## 2024-05-17 DIAGNOSIS — G8110 Spastic hemiplegia affecting unspecified side: Secondary | ICD-10-CM | POA: Diagnosis not present

## 2024-05-17 DIAGNOSIS — S069X5S Unspecified intracranial injury with loss of consciousness greater than 24 hours with return to pre-existing conscious level, sequela: Secondary | ICD-10-CM | POA: Insufficient documentation

## 2024-05-17 DIAGNOSIS — S069XAS Unspecified intracranial injury with loss of consciousness status unknown, sequela: Secondary | ICD-10-CM | POA: Insufficient documentation

## 2024-05-17 DIAGNOSIS — R41840 Attention and concentration deficit: Secondary | ICD-10-CM

## 2024-05-17 DIAGNOSIS — R4189 Other symptoms and signs involving cognitive functions and awareness: Secondary | ICD-10-CM | POA: Diagnosis not present

## 2024-05-17 MED ORDER — AMPHETAMINE-DEXTROAMPHETAMINE 15 MG PO TABS: 15.0000 mg | ORAL_TABLET | Freq: Two times a day (BID) | ORAL | 0 refills | Status: AC
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
## 2024-06-06 ENCOUNTER — Telehealth: Payer: Self-pay

## 2024-06-06 NOTE — Telephone Encounter (Signed)
 Patient is calling for refill on Adderall. I see that 2 prescriptions sent on 05/17/24, one for November and the other one says DNF until 06/29/24. I check PMP and last filled 05/09/24 so it may be one on file at pharmacy. I tried calling pharmacy but could never get a pharmacist on the phone. I send patient a MyChart asking him to check with pharmacy.

## 2024-11-15 ENCOUNTER — Encounter: Admitting: Physical Medicine & Rehabilitation
